# Patient Record
Sex: Male | Born: 1975
Health system: Southern US, Community
[De-identification: ages and names within clinical notes are randomized; demographics above are authoritative.]

## PROBLEM LIST (undated history)

## (undated) DIAGNOSIS — F419 Anxiety disorder, unspecified: Secondary | ICD-10-CM

## (undated) DIAGNOSIS — M1990 Unspecified osteoarthritis, unspecified site: Secondary | ICD-10-CM

## (undated) DIAGNOSIS — E785 Hyperlipidemia, unspecified: Secondary | ICD-10-CM

## (undated) DIAGNOSIS — G43909 Migraine, unspecified, not intractable, without status migrainosus: Secondary | ICD-10-CM

## (undated) DIAGNOSIS — A6920 Lyme disease, unspecified: Secondary | ICD-10-CM

## (undated) DIAGNOSIS — H8109 Meniere's disease, unspecified ear: Secondary | ICD-10-CM

## (undated) DIAGNOSIS — E079 Disorder of thyroid, unspecified: Secondary | ICD-10-CM

## (undated) DIAGNOSIS — S6290XA Unspecified fracture of unspecified wrist and hand, initial encounter for closed fracture: Secondary | ICD-10-CM

## (undated) DIAGNOSIS — IMO0001 Reserved for inherently not codable concepts without codable children: Secondary | ICD-10-CM

## (undated) DIAGNOSIS — R519 Headache, unspecified: Secondary | ICD-10-CM

## (undated) DIAGNOSIS — K900 Celiac disease: Secondary | ICD-10-CM

## (undated) HISTORY — DX: Migraine, unspecified, not intractable, without status migrainosus: G43.909

## (undated) HISTORY — DX: Celiac disease: K90.0

## (undated) HISTORY — DX: Anxiety disorder, unspecified: F41.9

## (undated) HISTORY — DX: Disorder of thyroid, unspecified: E07.9

## (undated) HISTORY — DX: Unspecified osteoarthritis, unspecified site: M19.90

## (undated) HISTORY — DX: Hyperlipidemia, unspecified: E78.5

## (undated) HISTORY — DX: Meniere's disease, unspecified ear: H81.09

## (undated) HISTORY — DX: Reserved for inherently not codable concepts without codable children: IMO0001

## (undated) HISTORY — PX: OTHER SURGICAL HISTORY: SHX169

## (undated) HISTORY — DX: Headache, unspecified: R51.9

## (undated) HISTORY — DX: Unspecified fracture of unspecified hand, initial encounter for closed fracture: S62.90XA

---

## 2000-05-30 ENCOUNTER — Emergency Department (HOSPITAL_COMMUNITY): Admission: EM | Admit: 2000-05-30 | Discharge: 2000-05-30 | Payer: Self-pay | Admitting: Emergency Medicine

## 2000-05-30 ENCOUNTER — Encounter: Payer: Self-pay | Admitting: Emergency Medicine

## 2001-10-11 ENCOUNTER — Encounter: Payer: Self-pay | Admitting: Gastroenterology

## 2001-10-11 ENCOUNTER — Encounter: Admission: RE | Admit: 2001-10-11 | Discharge: 2001-10-11 | Payer: Self-pay | Admitting: Gastroenterology

## 2002-10-25 ENCOUNTER — Emergency Department (HOSPITAL_COMMUNITY): Admission: EM | Admit: 2002-10-25 | Discharge: 2002-10-25 | Payer: Self-pay | Admitting: Emergency Medicine

## 2008-11-30 DIAGNOSIS — IMO0001 Reserved for inherently not codable concepts without codable children: Secondary | ICD-10-CM

## 2008-11-30 HISTORY — DX: Reserved for inherently not codable concepts without codable children: IMO0001

## 2010-07-15 ENCOUNTER — Ambulatory Visit: Payer: Self-pay | Admitting: Family Medicine

## 2010-07-15 DIAGNOSIS — F528 Other sexual dysfunction not due to a substance or known physiological condition: Secondary | ICD-10-CM | POA: Insufficient documentation

## 2010-07-15 DIAGNOSIS — J019 Acute sinusitis, unspecified: Secondary | ICD-10-CM | POA: Insufficient documentation

## 2010-07-15 DIAGNOSIS — F17200 Nicotine dependence, unspecified, uncomplicated: Secondary | ICD-10-CM | POA: Insufficient documentation

## 2010-07-16 ENCOUNTER — Encounter: Payer: Self-pay | Admitting: Family Medicine

## 2010-07-16 LAB — CONVERTED CEMR LAB
ALT: 45 units/L (ref 0–53)
AST: 30 units/L (ref 0–37)
Albumin: 4.6 g/dL (ref 3.5–5.2)
Alkaline Phosphatase: 63 units/L (ref 39–117)
BUN: 11 mg/dL (ref 6–23)
Basophils Absolute: 0 10*3/uL (ref 0.0–0.1)
Basophils Relative: 0 % (ref 0–1)
CO2: 25 meq/L (ref 19–32)
Calcium: 9.7 mg/dL (ref 8.4–10.5)
Chloride: 103 meq/L (ref 96–112)
Creatinine, Ser: 0.7 mg/dL (ref 0.40–1.50)
Eosinophils Absolute: 0.4 10*3/uL (ref 0.0–0.7)
Eosinophils Relative: 6 % — ABNORMAL HIGH (ref 0–5)
Glucose, Bld: 101 mg/dL — ABNORMAL HIGH (ref 70–99)
HCT: 45.1 % (ref 39.0–52.0)
Hemoglobin: 15.3 g/dL (ref 13.0–17.0)
Lymphocytes Relative: 41 % (ref 12–46)
Lymphs Abs: 3.1 10*3/uL (ref 0.7–4.0)
MCHC: 33.9 g/dL (ref 30.0–36.0)
MCV: 84.9 fL (ref 78.0–100.0)
Monocytes Absolute: 0.4 10*3/uL (ref 0.1–1.0)
Monocytes Relative: 6 % (ref 3–12)
Neutro Abs: 3.7 10*3/uL (ref 1.7–7.7)
Neutrophils Relative %: 48 % (ref 43–77)
PSA: 0.45 ng/mL (ref <=4.00)
Platelets: 247 10*3/uL (ref 150–400)
Potassium: 4 meq/L (ref 3.5–5.3)
RBC: 5.31 M/uL (ref 4.22–5.81)
RDW: 12.3 % (ref 11.5–15.5)
Sex Hormone Binding: 38 nmol/L (ref 13–71)
Sodium: 140 meq/L (ref 135–145)
TSH: 1.736 microintl units/mL (ref 0.350–4.500)
Testosterone Free: 68.7 pg/mL (ref 47.0–244.0)
Testosterone-% Free: 1.8 % (ref 1.6–2.9)
Testosterone: 372.33 ng/dL (ref 250–890)
Total Bilirubin: 0.4 mg/dL (ref 0.3–1.2)
Total Protein: 7.7 g/dL (ref 6.0–8.3)
WBC: 7.7 10*3/uL (ref 4.0–10.5)

## 2010-08-05 ENCOUNTER — Encounter: Payer: Self-pay | Admitting: Family Medicine

## 2010-09-06 ENCOUNTER — Encounter: Payer: Self-pay | Admitting: Family Medicine

## 2010-09-06 ENCOUNTER — Telehealth: Payer: Self-pay | Admitting: Family Medicine

## 2010-09-06 ENCOUNTER — Ambulatory Visit
Admission: RE | Admit: 2010-09-06 | Discharge: 2010-09-06 | Payer: Self-pay | Source: Home / Self Care | Attending: Family Medicine | Admitting: Family Medicine

## 2010-09-06 DIAGNOSIS — R002 Palpitations: Secondary | ICD-10-CM | POA: Insufficient documentation

## 2010-09-06 DIAGNOSIS — F411 Generalized anxiety disorder: Secondary | ICD-10-CM | POA: Insufficient documentation

## 2010-09-09 ENCOUNTER — Telehealth: Payer: Self-pay | Admitting: Family Medicine

## 2010-09-09 LAB — CONVERTED CEMR LAB
ALT: 39 units/L (ref 0–53)
AST: 26 units/L (ref 0–37)
Albumin: 4.6 g/dL (ref 3.5–5.2)
Alkaline Phosphatase: 58 units/L (ref 39–117)
BUN: 11 mg/dL (ref 6–23)
CO2: 27 meq/L (ref 19–32)
Calcium: 9.1 mg/dL (ref 8.4–10.5)
Chloride: 104 meq/L (ref 96–112)
Cholesterol: 256 mg/dL — ABNORMAL HIGH (ref 0–200)
Creatinine, Ser: 0.77 mg/dL (ref 0.40–1.50)
Glucose, Bld: 91 mg/dL (ref 70–99)
HDL: 42 mg/dL (ref >39)
LDL Cholesterol: 195 mg/dL — ABNORMAL HIGH (ref 0–99)
Potassium: 4.6 meq/L (ref 3.5–5.3)
Sodium: 140 meq/L (ref 135–145)
TSH: 1.731 microintl units/mL (ref 0.350–4.500)
Total Bilirubin: 0.4 mg/dL (ref 0.3–1.2)
Total CHOL/HDL Ratio: 6.1
Total Protein: 7.8 g/dL (ref 6.0–8.3)
Triglycerides: 97 mg/dL (ref <150)
VLDL: 19 mg/dL (ref 0–40)

## 2010-09-12 ENCOUNTER — Telehealth: Payer: Self-pay | Admitting: Family Medicine

## 2010-09-16 ENCOUNTER — Telehealth: Payer: Self-pay | Admitting: Family Medicine

## 2010-09-27 ENCOUNTER — Ambulatory Visit
Admission: RE | Admit: 2010-09-27 | Discharge: 2010-09-27 | Payer: Self-pay | Source: Home / Self Care | Attending: Family Medicine | Admitting: Family Medicine

## 2010-09-27 ENCOUNTER — Encounter: Payer: Self-pay | Admitting: Family Medicine

## 2010-09-27 DIAGNOSIS — G5600 Carpal tunnel syndrome, unspecified upper limb: Secondary | ICD-10-CM | POA: Insufficient documentation

## 2010-09-27 DIAGNOSIS — E785 Hyperlipidemia, unspecified: Secondary | ICD-10-CM | POA: Insufficient documentation

## 2010-10-01 NOTE — Assessment & Plan Note (Signed)
Summary: NOV: Sinusitis, ED   Vital Signs:  Patient profile:   35 year old male Height:      69.75 inches Weight:      212 pounds Pulse rate:   96 / minute BP sitting:   122 / 79  (right arm) Cuff size:   regular  Vitals Entered By: Avon Gully CMA, Duncan Dull) (July 15, 2010 2:23 PM) CC: NP-sinus pressure and pain and left jaw pain x 1 month, finished a z-pak still not feeling well   CC:  NP-sinus pressure and pain and left jaw pain x 1 month and finished a z-pak still not feeling well.  History of Present Illness: NP-sinus pressure and pain and left jaw pain x 1 month, finished a z-pak still not feeling well.Hx of recurrent sinus sxs. Usually uses a netti-pot. Pain is dull asn worse at night when lays flat over the right eye and in the right jaw and ear.  No ear drainage.  FEver about a week ago.  Zpack was a month ago adn did feel better for about a week.  Occ uses benadryl at bedtime.  Has had allergy testing a few years ago and was allergic to cats adn oak trees.  Does get some diarrhea with antibiotics.    ED sxs for almost a year. patient says he was having intercourse with his wife when he felt something pop in his penis.  Since then he notes he has had problems with erectile dysfunction.  He had difficulty getting a full erection.  There he admits this happened occasionally before the incident.  He does have normal nocturnal erections.  He is very happy with his marriage and his wife.  He denies any penile discharge or lesions.  Habits & Providers  Alcohol-Tobacco-Diet     Alcohol drinks/day: 0     Tobacco Status: current     Other Tobacco snuff  Current Medications (verified): 1)  None  Allergies (verified): 1)  ! Naproxen Sodium (Naproxen Sodium)  Comments:  Nurse/Medical Assistant: The patient's medications and allergies were reviewed with the patient and were updated in the Medication and Allergy Lists. 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  Married Lurena Joiner with one son.  Mother in law also lives in the home.  Smoking Status:  current  Review of Systems       No fever/sweats/weakness, unexplained weight loss/gain.  + vison changes.  + difficulty hearing/ringing in ears, hay fever/allergies.  + chest pain/discomfort, palpitations.  No Br lump/nipple discharge.  No cough/wheeze.  No blood in BM, nausea/vomiting/diarrhea.  No nighttime urination, leaking urine, unusual vaginal bleeding, discharge (penis or vagina).  + muscle/joint pain. No rash, change in mole.  + HA. No memory loss.  + anxiety. No  sleep d/o, depression.  No easy bruising/bleeding, unexplained lump. + concern with sexual function.   Physical Exam  General:  Well-developed,well-nourished,in no acute distress; alert,appropriate and cooperative throughout examination Head:  Normocephalic and atraumatic without obvious abnormalities. No apparent alopecia or balding. Eyes:  No corneal or conjunctival inflammation noted. EOMI. Perrla.  Ears:  External ear  exam shows no significant lesions or deformities.  Otoscopic examination reveals clear canals, tympanic membranes are intact bilaterally without bulging, retraction, inflammation or discharge. Hearing is grossly normal bilaterally. Nose:  External nasal examination shows no deformity or inflammation.  Mouth:  Oral mucosa and oropharynx without lesions or exudates.  Teeth in good repair. Lungs:  Normal respiratory effort, chest expands symmetrically.Mild wheez e on the right lower lung.  Heart:   Normal rate and regular rhythm. S1 and S2 normal without gallop, murmur, click, rub or other extra sounds. Skin:  no rashes.   Cervical Nodes:  No lymphadenopathy noted Psych:  Cognition and judgment appear intact. Alert and cooperative with normal attention span and concentration. No apparent delusions, illusions, hallucinations   Impression & Recommendations:  Problem # 1:  SINUSITIS - ACUTE-NOS (ICD-461.9) it appears he has an acute on chronic sinusitis.  Will change to doxycycline.  If he is not improved in one week then may need to get further imaging or change antibiotics again.  He does have sensitivity to medications causing diarrhea so will discontinue if he has excessive diarrhea.  Because he has a history of chronic infections I will go ahead and refer him to ENT for further evaluation.  He may benefit from surgery. His updated medication list for this problem includes:    Doxycycline Hyclate 100 Mg Caps (Doxycycline hyclate) .Marland Kitchen... Take 1 tablet by mouth two times a day for 10 day.  Orders: ENT Referral (ENT)  Instructed on treatment. Call if symptoms persist or worsen.   Problem # 2:  ERECTILE DYSFUNCTION, NON-ORGANIC (ICD-302.72) discussed with patient that we would rule out causes of ED including abnormal glucose thyroid and low testosterone levels.  He did fill out an ED questionnaire for me.  He had a score of 18.  He did answer yes to 3 questions on a low testosterone this.  Based on his history and that his symptoms technically started before the incident and he has normal nocturnal erections I do think he probably has more psychogenic ED.  If labs are normal can do a trial of Cialis.  If this does not help his symptoms and I do recommend referral to urology to make sure he does not have any permanent damage.  And we discussed the fact that I do think that this was more psychogenic. Orders: T-CBC w/Diff [phone]) T-Comprehensive Metabolic Panel [id number]) T-TSH  [id number]) T-PSA [id number]) T-Testosterone, Free and Total ([phone])  His updated medication list for this problem includes:    Viagra 100 Mg Tabs (Sildenafil citrate) .Marland Kitchen... Take 1 tablet by mouth once a day as needed about one hour before intercourse.  Complete Medication List: 1)  Doxycycline Hyclate 100 Mg Caps (Doxycycline hyclate) .... Take 1 tablet by mouth two times a day for 10 day. 2)  Viagra 100 Mg Tabs (Sildenafil citrate) .... Take 1 tablet by mouth once a day as needed about one hour before intercourse.  Patient Instructions: 1)  WE will call you with your lab results.  2)  I will send 2 meds over to your pharmacy.  3)  Please schedule a follow-up appointment in 1 month for the ED.  Prescriptions: VIAGRA 100 MG TABS (SILDENAFIL CITRATE) Take 1 tablet by mouth once a day as needed about one hour before intercourse.  #8 x 1   Entered and Authorized by:   Nani Gasser MD   Signed by:   Nani Gasser MD on 07/15/2010   Method used:   Electronically to  [facility] [phone]* (retail)       [address] [place], [place]  [address]       Ph: [phone]       Fax: [phone]   RxID:   [id number] DOXYCYCLINE HYCLATE 100 MG CAPS (DOXYCYCLINE HYCLATE) Take 1 tablet by mouth two times a day for 10 day.  #20 x 0   Entered and Authorized by:   Nani Gasser MD   Signed by:   Nani Gasser MD on 07/15/2010   Method used:   Electronically to        [facility] [phone]* (retail)       [address] [place], [place]  [address]       Ph: [phone]       Fax: [phone]   RxID:   [id number]    Orders Added: 1)  ENT Referral [ENT] 2)  New Patient Level IV [99204] 3)  T-CBC w/Diff [[id number]] 4)  T-Comprehensive Metabolic Panel [[id number]] 5)  T-TSH [[id number]] 6)  T-PSA [[id number]] 7)  T-Testosterone, Free and Total [phone]

## 2010-10-01 NOTE — Letter (Signed)
Summary: ED Questionnaire  ED Questionnaire   Imported By: Lanelle Bal 07/29/2010 08:40:37  _____________________________________________________________________  External Attachment:    Type:   Image     Comment:   External Document

## 2010-10-03 NOTE — Assessment & Plan Note (Signed)
Summary: Mood, etc   Vital Signs:  Patient profile:   35 year old male Height:      69.75 inches Weight:      214 pounds Pulse rate:   66 / minute BP sitting:   125 / 68  (right arm) Cuff size:   regular  Vitals Entered By: Isaias Cowman CMA, Deborra Medina) (September 27, 2010 3:54 PM) CC: f/u mood   Primary Care Provider:  Beatrice Lecher MD  CC:  f/u mood.  History of Present Illness: Start the sertraline. By the second week was sleeping much better. Did also cut out caffiene.   People around him feel he is not as gruff and irritable.  When uses the xanax says it really makes a big difference for him, not as irritable or anxious. Tought has to take 2 tabs for it to work and finds he really needs to take it bid.  He says it takes 2 hours to start working for him. Feels thinking more clearly and feels he has dec his smoking.   He also has hx of carpel tunnel in both hands.  Has had numbness in his fingertips on his left hand.  Says occ hands feel weak at work. No pain in the elbow but does have some pain in the right elbow with ratcheting when flexes his right elbow. Has stopped taking Advil Daily. Says his stomach is actually better. Can't take Tyelnol. Hurt his back over the weekend.   Current Medications (verified): 1)  Probiotic  Caps (Probiotic Product) 2)  Sertraline Hcl 100 Mg Tabs (Sertraline Hcl) .... Take 1 Tablet By Mouth Once A Day 3)  Clonazepam 0.5 Mg Tabs (Clonazepam) .... Take 1-2  Tablet By Mouth Once A Day As Needed Anxiety 4)  Lipitor 40 Mg Tabs (Atorvastatin Calcium) .... Take 1 Tablet By Mouth Once A Day At Bedtime. Generic Please.  Allergies (verified): 1)  ! Naproxen Sodium (Naproxen Sodium)  Comments:  Nurse/Medical Assistant: The patient's medications and allergies were reviewed with the patient and were updated in the Medication and Allergy Lists. 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  Married Wells Guiles with one son.  Mother in law also lives in the home.    Past Medical History: Exercise Stress Test  11/2008 at [facility] - Negative.  Terminated for fatigue .  right  hand fracture.   Past Surgical History: Surgery on right hand.   Social History: Reviewed history from 07/15/2010 and no changes required. 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  Married Wells Guiles with one son.  Mother in law also lives in the home.    Physical Exam  General:  Well-developed,well-nourished,in no acute distress; alert,appropriate and cooperative throughout examination Lungs:  Normal respiratory effort, chest expands symmetrically. Lungs are clear to auscultation, no crackles or wheezes. Heart:  Normal rate and regular rhythm. S1 and S2 normal without gallop, murmur, click, rub or other extra sounds. Psych:  Cognition and judgment appear intact. Alert and cooperative with normal attention span and concentration. No apparent delusions, illusions, hallucinations   Impression & Recommendations:  Problem # 1:  GENERALIZED ANXIETY DISORDER (ICD-300.02) Assessment Deteriorated Says lexapro made him "crazy" in the past. Tolerating sertraline well but not efficacious at this point.  Will inc to 200 mg and f/u in 3-4 weeks. If no sign of sig improvment at f/u then will change to fluoxetine.  GAD-7 score of 14 today which is actually worse than last time thought he does feel he is sleeping better and is less irritable.  F/ U in 1 months.   30 plus minutes spent face to face in counseling.  The following medications were removed from the medication list:    Sertraline Hcl 100 Mg Tabs (Sertraline hcl) .Marland Kitchen... Take 1 tablet by mouth once a day His updated medication list for this problem includes:    Sertraline Hcl 100 Mg Tabs (Sertraline hcl) .Marland Kitchen..Marland Kitchen Two tabs by mouth daily.    Clonazepam 1 Mg Tabs (Clonazepam) ..... One or twice a day as needed for  rescue anxiety  Problem # 2:  HYPERLIPIDEMIA (ICD-272.4) He really doesn't want to take th statin. Wants to try exercise and diet Explained this will not liekly get him to goal and it likely genetic but will try it for 3-4 months and then recheck.  He said he is motivated to make changes.  The following medications were removed from the medication list:    Lipitor 40 Mg Tabs (Atorvastatin calcium) .Marland Kitchen... Take 1 tablet by mouth once a day at bedtime. generic please.  Problem # 3:  CARPAL TUNNEL SYNDROME, BILATERAL (ICD-354.0) Assessment: New Recommend start wearing his splints at night and if not improving in 3-4 weeks to let me know and will consider ortho referral. His GI are sxs are better of of daily Advil but can still use it occ if needed.   Complete Medication List: 1)  Sertraline Hcl 100 Mg Tabs (Sertraline hcl) .... Two tabs by mouth daily. 2)  Probiotic Caps (Probiotic product) 3)  Clonazepam 1 Mg Tabs (Clonazepam) .... One or twice a day as needed for rescue anxiety 4)  Ventolin Hfa 108 (90 Base) Mcg/act Aers (Albuterol sulfate) .... 2-4 puffs inhaled eveyr 4-6 horus as needed wheezing  Patient Instructions: 1)  Let recheck your cholesterol in 3-4 months with diet and exericse 2)  Please schedule a follow-up appointment in 1 month for mood. Increase sertraline to 2 tabs.  Prescriptions: CLONAZEPAM 1 MG TABS (CLONAZEPAM) one or twice a day as needed for rescue anxiety  #45 x 0   Entered and Authorized by:   Beatrice Lecher MD   Signed by:   Beatrice Lecher MD on 09/27/2010   Method used:   Printed then faxed to ...       [facility] [phone]* (retail)       [address] [place], [place]  [address]       Ph: [phone]       Fax: [phone]   RxID:   [id number] SERTRALINE HCL 100 MG TABS (SERTRALINE HCL) two tabs by mouth daily.  #60 x 0   Entered and Authorized by:   Beatrice Lecher MD   Signed by:   Beatrice Lecher MD on 09/27/2010    Method used:   Electronically to        [facility] [phone]* (retail)       [place], [place]  [address]       Ph: [phone]       Fax: [phone]   RxID:   [id number]    Orders Added: 1)  Est. Patient Level IV [23557]

## 2010-10-03 NOTE — Letter (Signed)
Summary: Anxiety Questionnaire  Anxiety Questionnaire   Imported By: Edmonia James 09/25/2010 10:39:17  _____________________________________________________________________  External Attachment:    Type:   Image     Comment:   External Document

## 2010-10-03 NOTE — Assessment & Plan Note (Signed)
Summary: CPE and palpitations.    Vital Signs:  Patient profile:   35 year old male Height:      69.75 inches Weight:      231 pounds Pulse rate:   63 / minute BP sitting:   93 / 52  (right arm) Cuff size:   regular  Vitals Entered By: Isaias Cowman CMA, Deborra Medina) (September 06, 2010 10:32 AM) CC: cpe,heart feels like its beating hard or racing at times   Primary Care Provider:  Beatrice Lecher MD  CC:  cpe and heart feels like its beating hard or racing at times.  History of Present Illness: cpe,heart feels like its beating hard or racing at times.Says saw cardiology at [facility] about a year ago and told it was not her heart.   Family hx of anxiety.  Says the heart racing went away but now recently it started gain. Has had several viral syndromes this winter. Has been alot more stressed lately. Works out a couple of days a week adn still not relieving his stress.  he has no history of high blood pressure or known high cholesterol.  and a premature family history of heart disease.  He does not do any dizziness or shortness of breath with the episodes.  He says they only last maybe a minute or two.  He typically happens when he feels anxious or is upset.  Current Medications (verified): 1)  Probiotic  Caps (Probiotic Product)  Allergies (verified): 1)  ! Naproxen Sodium (Naproxen Sodium)  Comments:  Nurse/Medical Assistant: The patient's medications and allergies were reviewed with the patient and were updated in the Medication and Allergy Lists. Isaias Cowman CMA, Deborra Medina) (September 06, 2010 10:33 AM)  Family History: grandfather with alcoholism  mother with breast cancer. hi chol  grandmother with MI and diabetes father with hypertension and high cholesterol uncle with colon cance Ant with breast cancer grandmother with lung cancer grandfather with prostate cancer uncle with melanoma  Review of Systems  The patient denies anorexia, fever, weight loss, weight gain, vision  loss, decreased hearing, hoarseness, chest pain, syncope, dyspnea on exertion, peripheral edema, prolonged cough, headaches, hemoptysis, abdominal pain, melena, hematochezia, severe indigestion/heartburn, hematuria, incontinence, genital sores, muscle weakness, suspicious skin lesions, transient blindness, difficulty walking, depression, unusual weight change, abnormal bleeding, enlarged lymph nodes, angioedema, and testicular masses.    Physical Exam  General:  Well-developed,well-nourished,in no acute distress; alert,appropriate and cooperative throughout examination Head:  Normocephalic and atraumatic without obvious abnormalities. No apparent alopecia or balding. Eyes:  No corneal or conjunctival inflammation noted. EOMI. Perrla.  Ears:  External ear exam shows no significant lesions or deformities.  Otoscopic examination reveals clear canals, tympanic membranes are intact bilaterally without bulging, retraction, inflammation or discharge. Hearing is grossly normal bilaterally. Nose:  External nasal examination shows no deformity or inflammation. Nasal mucosa are pink and moist without lesions or exudates. Mouth:  Oral mucosa and oropharynx without lesions or exudates.  Teeth in good repair. Neck:  No deformities, masses, or tenderness noted. Lungs:  Normal respiratory effort, chest expands symmetrically. Lungs are clear to auscultation, no crackles or wheezes. Heart:  Normal rate and regular rhythm. S1 and S2 normal without gallop, murmur, click, rub or other extra sounds. Abdomen:  Bowel sounds positive,abdomen soft and non-tender without masses, organomegaly or hernias noted. Msk:  No deformity or scoliosis noted of thoracic or lumbar spine.   Pulses:  R and L carotid,radial,dorsalis pedis and posterior tibial pulses are full and equal bilaterally  Extremities:  No clubbing, cyanosis, edema, or deformity noted with normal full range of motion of all joints.   Neurologic:  No cranial nerve  deficits noted. Station and gait are normal. DTRs are symmetrical throughout. Sensory, motor and coordinative functions appear intact. Skin:  no rashes.   Cervical Nodes:  No lymphadenopathy noted Psych:  Cognition and judgment appear intact. Alert and cooperative with normal attention span and concentration. No apparent delusions, illusions, hallucinations   Impression & Recommendations:  Problem # 1:  [place] (ICD-V70.0) exam is normal today.  Due for screening lab work.I do not have an update screening cholesterol on him.  But also like to check his thyroid since he is having palpitations.  He really does need update vaccines that he does not want a flu shot today.  Orders: T-Comprehensive Metabolic Panel ([id number]) T-Lipid Profile [id number]) T-TSH ([id number])  Problem # 2:  PALPITATIONS (ICD-785.1)  EKG shows rate of 73 bpm, NSR. no acue changes.  Gave him reassurance that this is likely anxiety. He is already in counseling, through his church. Has been going there for 6 yearas.  GAD-7 today is 11. we discussed different options including medications as he is Re: doing counseling and exercising regularly.  We discusseddiscussed SSRIs and the potential side effects.  He's to monitor for any suicidal thoughts.  I will start him on sertraline and see him back in 3 or 4 weeks.  Orders: EKG w/ Interpretation (93000)  Problem # 3:  GENERALIZED ANXIETY DISORDER (ICD-300.02)  His updated medication list for this problem includes:    Sertraline Hcl 50 Mg Tabs (Sertraline hcl) .Marland Kitchen... 1/2 tab by mouth dialy for one week, then increase to whole tab dialy.  Orders: T-Comprehensive Metabolic Panel ([id number]) T-Lipid Profile [id number]) T-TSH [id number])  Complete Medication List: 1)  Probiotic Caps (Probiotic product) 2)  Sertraline Hcl 50 Mg Tabs (Sertraline hcl) .... 1/2 tab by mouth dialy for one week, then increase to whole tab dialy.  Patient  Instructions: 1)  Please schedule a follow-up appointment in 3-4 weeks for mood before due for your refill.  Call if any concerns 2)  We will call you next week with your lab results.   Prescriptions: SERTRALINE HCL 50 MG TABS (SERTRALINE HCL) 1/2 tab by mouth dialy for one week, then increase to whole tab dialy.  #30 x 0   Entered and Authorized by:   Beatrice Lecher MD   Signed by:   Beatrice Lecher MD on 09/06/2010   Method used:   Electronically to        [facility] (retail)       [place], [place]  [address]       Ph: [phone]       Fax: [phone]   RxID:   [id number]    Orders Added: 1)  T-Comprehensive Metabolic Panel [[id number]] 2)  T-Lipid Profile [[id number]] 3)  T-TSH [[id number]] 4)  EKG w/ Interpretation [93000] 5)  Est. Patient Level III [15830] 6)  Est. Patient age 66-39 [41]      Appended Document: CPE and palpitations.     Past History:  Past Medical History: Exercise Stress Test  11/2008 at [facility] - Negative.  Terminated for fatigue .     Lipid Panel Test Date: 12/13/2010                        Value  Units        H/L   Reference  Cholesterol:          210          mg/dL              (100-240) LDL Cholesterol:      139          mg/dL              (40-160) HDL Cholesterol:      43           mg/dL              (30-80) Triglyceride:         138          mg/dL              (50-300)

## 2010-10-03 NOTE — Progress Notes (Signed)
Summary: PT REQ A CALL  Phone Note Call from Patient   Caller: Patient Summary of Call: PT CALLED REQ TO HAVE INHALER ALBUTEROL CALLED INTO [facility] IN [place]. PT [facility] [phone] Initial call taken by: Janeal Holmes,  September 09, 2010 10:13 AM  Follow-up for Phone Call        For what? Does he have asthma? He has never told me this.  Follow-up by: Nani Gasser MD,  September 09, 2010 3:51 PM  Additional Follow-up for Phone Call Additional follow up Details #1::        called and left message on pt vm to call back to let us know if he has been dx with asthma Additional Follow-up by: Avon Gully CMA, Duncan Dull),  September 09, 2010 5:12 PM    New/Updated Medications: VENTOLIN HFA 108 (90 BASE) MCG/ACT AERS (ALBUTEROL SULFATE) 2-4 puffs inhaled eveyr 4-6 horus as needed wheezing Prescriptions: VENTOLIN HFA 108 (90 BASE) MCG/ACT AERS (ALBUTEROL SULFATE) 2-4 puffs inhaled eveyr 4-6 horus as needed wheezing  #1 x 0   Entered and Authorized by:   Nani Gasser MD   Signed by:   Nani Gasser MD on 09/09/2010   Method used:   Electronically to        [facility] [phone]* (retail)       [address]       [place], [place]  [address]       Ph: [phone]       Fax: [phone]   RxID:   [id number]

## 2010-10-03 NOTE — Progress Notes (Signed)
Summary: Clonazepam Rx  Phone Note Refill Request Call back at Home Phone [phone] Message from:  Patient on September 16, 2010 1:03 PM  Refills Requested: Medication #1:  CLONAZEPAM 0.5 MG TABS Take 1 tablet by mouth once a day as needed anxiety Pls send in Rx, pt states that dosage should be 2 tablets a day   Method Requested: Electronic Initial call taken by: Lannette Donath,  September 16, 2010 1:04 PM  Follow-up for Phone Call        since we just sent rx in on 1/6 and we changed directions to take two at one time per pt request can we send in another rx? Follow-up by: Avon Gully CMA, Duncan Dull),  September 17, 2010 9:48 AM  Additional Follow-up for Phone Call Additional follow up Details #1::        Yes new rx sent. I inc dose to 30 tabs but this is to last a month. Please let him know that.He is not to use them every single day and we talked about that in the room. If stil having alot of anxiety I can adjust his sertraline as well.  Additional Follow-up by: Nani Gasser MD,  September 17, 2010 10:22 AM    Additional Follow-up for Phone Call Additional follow up Details #2::    pt notified. pt states he notices the anxiety seems to increase Follow-up by: Avon Gully CMA, Duncan Dull),  September 17, 2010 10:56 AM  New/Updated Medications: SERTRALINE HCL 100 MG TABS (SERTRALINE HCL) Take 1 tablet by mouth once a day CLONAZEPAM 0.5 MG TABS (CLONAZEPAM) Take 1-2  tablet by mouth once a day as needed anxiety Prescriptions: SERTRALINE HCL 100 MG TABS (SERTRALINE HCL) Take 1 tablet by mouth once a day  #30 x 0   Entered and Authorized by:   Nani Gasser MD   Signed by:   Nani Gasser MD on 09/17/2010   Method used:   Electronically to        [facility] [phone]* (retail)       [address]       [place], [place]  [address]       Ph: [phone]       Fax: [phone]   RxID:   [id number] SERTRALINE HCL 100 MG TABS (SERTRALINE HCL) Take 1 tablet by  mouth once a day  #30 x 0   Entered and Authorized by:   Nani Gasser MD   Signed by:   Nani Gasser MD on 09/17/2010   Method used:   Printed then faxed to ...       [facility] [phone]* (retail)       [address] [place], [place]  [address]       Ph: [phone]       Fax: [phone]   RxID:   [id number] CLONAZEPAM 0.5 MG TABS (CLONAZEPAM) Take 1-2  tablet by mouth once a day as needed anxiety  #30 x 0   Entered and Authorized by:   Nani Gasser MD   Signed by:   Nani Gasser MD on 09/17/2010   Method used:   Printed then faxed to ...       [facility] [phone]* (retail)       [address] [place], [place]  [address]       Ph: [phone]       Fax: [phone]   RxID:  [id number]  

## 2010-10-03 NOTE — Consult Note (Signed)
Summary: [facility]  [facility]   Imported By: Lanelle Bal 08/20/2010 11:10:34  _____________________________________________________________________  External Attachment:    Type:   Image     Comment:   External Document

## 2010-10-03 NOTE — Progress Notes (Signed)
Summary: meds  Phone Note Call from Patient   Caller: Patient Call For: Beatrice Lecher MD Summary of Call: pt called and states he thought there was supposed to be another medication called into the pharm that was a rescue medication for his anxiety Initial call taken by: Isaias Cowman CMA, Deborra Medina),  September 06, 2010 2:23 PM  Follow-up for Phone Call        Sorry. Now sent.  Follow-up by: Beatrice Lecher MD,  September 06, 2010 5:09 PM    New/Updated Medications: CLONAZEPAM 0.5 MG TABS (CLONAZEPAM) Take 1 tablet by mouth once a day as needed anxiety Prescriptions: CLONAZEPAM 0.5 MG TABS (CLONAZEPAM) Take 1 tablet by mouth once a day as needed anxiety  #15 x 0   Entered and Authorized by:   Beatrice Lecher MD   Signed by:   Beatrice Lecher MD on 09/06/2010   Method used:   Printed then faxed to ...       [facility] [phone]* (retail)       [place], [place]  [address]       Ph: [phone]       Fax: [phone]   RxID:   [id number]

## 2010-10-03 NOTE — Progress Notes (Signed)
Summary: ? about clonazepam  Phone Note Call from Patient Call back at [phone]   Caller: Patient Call For: Nani Gasser MD Summary of Call: Pt called and states the Clonazepam is not helping at all. Pt wanted to know if it was ok to take one in the am and one in the pm can he have a higher dose  Initial call taken by: Avon Gully CMA, Duncan Dull),  September 12, 2010 3:55 PM  Follow-up for Phone Call        If not working well. Try 2 tabs at one time and see if helps.  Follow-up by: Nani Gasser MD,  September 12, 2010 4:00 PM  Additional Follow-up for Phone Call Additional follow up Details #1::        pt notified Additional Follow-up by: Avon Gully CMA, Duncan Dull),  September 12, 2010 4:47 PM

## 2010-10-17 NOTE — Letter (Signed)
Summary: Generalized Anxiety Disorder Scale  Generalized Anxiety Disorder Scale   Imported By: Maryln Gottron 10/10/2010 12:26:37  _____________________________________________________________________  External Attachment:    Type:   Image     Comment:   External Document

## 2010-10-23 NOTE — Letter (Signed)
Summary: Records Dated 1-06 thru 4-10/[facility]   Records Dated 1-06 thru 4-10/[facility]   Imported By: Lanelle Bal 10/15/2010 13:46:30  _____________________________________________________________________  External Attachment:    Type:   Image     Comment:   External Document

## 2010-10-25 ENCOUNTER — Ambulatory Visit (INDEPENDENT_AMBULATORY_CARE_PROVIDER_SITE_OTHER): Payer: BC Managed Care – PPO | Admitting: Family Medicine

## 2010-10-25 ENCOUNTER — Encounter: Payer: Self-pay | Admitting: Family Medicine

## 2010-10-25 DIAGNOSIS — IMO0001 Reserved for inherently not codable concepts without codable children: Secondary | ICD-10-CM | POA: Insufficient documentation

## 2010-10-25 DIAGNOSIS — R062 Wheezing: Secondary | ICD-10-CM

## 2010-10-25 DIAGNOSIS — F411 Generalized anxiety disorder: Secondary | ICD-10-CM

## 2010-10-29 NOTE — Assessment & Plan Note (Signed)
Summary: 1 mo f/u anxiety   Vital Signs:  Patient profile:   35 year old male Height:      69.75 inches Weight:      217.25 pounds BMI:     31.51 Temp:     99.0 degrees F oral Pulse rate:   90 / minute BP sitting:   129 / 76  (right arm) Cuff size:   large  Vitals Entered By: Jiles Garter CMA (October 25, 2010 4:19 PM) CC: 1 month follow up Comments medication follow up-tolerating medication okay   Primary Care Provider:  Beatrice Lecher MD  CC:  1 month follow up.  History of Present Illness: Less irritability over the last month. Says over the weeknd she feels like the medicine finally kicked in.   Still wheezing at night. Most nights. Says the inhaler made his throat and lungs itch. Says his wife can hear him wheezing. No hx of athma.  This only started recently. He does occ snore.   Allergies: 1)  ! Naproxen Sodium (Naproxen Sodium)  Physical Exam  General:  Well-developed,well-nourished,in no acute distress; alert,appropriate and cooperative throughout examination Lungs:  Normal respiratory effort, chest expands symmetrically. Lungs are clear to auscultation, no crackles or wheezes. Heart:  Normal rate and regular rhythm. S1 and S2 normal without gallop, murmur, click, rub or other extra sounds.   Impression & Recommendations:  Problem # 1:  GENERALIZED ANXIETY DISORDER (ICD-300.02) GAD-7 score of 6 today, much improved.  Of note at the end fo teh visit he told me ihe is only taking 1 tab of the sertaline. Certainly he can inc to 1 and 1/2 tabs or 2 tabs dialy.  F/U in 2 months.  His updated medication list for this problem includes:    Sertraline Hcl 100 Mg Tabs (Sertraline hcl) .Marland Kitchen..Marland Kitchen Two tabs by mouth daily.    Clonazepam 1 Mg Tabs (Clonazepam) ..... One or twice a day as needed for rescue anxiety  Problem # 2:  WHEEZING (ICD-786.07) Since he says his nightime wheeze is auditory this is very unlikely to be asthma.  Discussed options of spirometry vs ENT vs  pulmonary referral. I really think this could be reflux, throat obstruction (possibly sleep apnea) or sinus sxs.  He has recently had a cold but he is feeling better.    Complete Medication List: 1)  Sertraline Hcl 100 Mg Tabs (Sertraline hcl) .... Two tabs by mouth daily. 2)  Probiotic Caps (Probiotic product) 3)  Clonazepam 1 Mg Tabs (Clonazepam) .... One or twice a day as needed for rescue anxiety  Patient Instructions: 1)  Recommend start nasal rinse.  2)  Please schedule a follow-up appointment in 2 months for mood.  3)  Let me know if you want to see ENT or Pulmonary for nighttimne wheezing.  Prescriptions: CLONAZEPAM 1 MG TABS (CLONAZEPAM) one or twice a day as needed for rescue anxiety  #30 x 0   Entered and Authorized by:   Beatrice Lecher MD   Signed by:   Beatrice Lecher MD on 10/25/2010   Method used:   Printed then faxed to ...       [facility] [phone]* (retail)       [place], [place]  [address]       Ph: [phone]       Fax: [phone]   RxID:   [id number] SERTRALINE HCL 100 MG TABS (SERTRALINE HCL) two tabs by mouth daily.  #  60 x 1   Entered and Authorized by:   Beatrice Lecher MD   Signed by:   Beatrice Lecher MD on 10/25/2010   Method used:   Printed then faxed to ...       [facility] [phone]* (retail)       [place], [place]  [address]       Ph: [phone]       Fax: [phone]   RxID:   [id number]    Orders Added: 1)  Est. Patient Level III [74718]    Current Allergies (reviewed today): ! NAPROXEN SODIUM (NAPROXEN SODIUM)

## 2010-11-05 ENCOUNTER — Telehealth: Payer: Self-pay | Admitting: Family Medicine

## 2010-11-06 ENCOUNTER — Ambulatory Visit
Admission: RE | Admit: 2010-11-06 | Discharge: 2010-11-06 | Disposition: A | Discharge status: Home / Self Care | Payer: BC Managed Care – PPO | Source: Ambulatory Visit | Attending: Family Medicine | Admitting: Family Medicine

## 2010-11-06 ENCOUNTER — Ambulatory Visit (INDEPENDENT_AMBULATORY_CARE_PROVIDER_SITE_OTHER): Payer: BC Managed Care – PPO | Admitting: Family Medicine

## 2010-11-06 ENCOUNTER — Encounter: Payer: Self-pay | Admitting: Family Medicine

## 2010-11-06 ENCOUNTER — Other Ambulatory Visit: Payer: Self-pay | Admitting: Family Medicine

## 2010-11-06 DIAGNOSIS — F411 Generalized anxiety disorder: Secondary | ICD-10-CM

## 2010-11-06 DIAGNOSIS — R197 Diarrhea, unspecified: Secondary | ICD-10-CM

## 2010-11-06 DIAGNOSIS — R062 Wheezing: Secondary | ICD-10-CM

## 2010-11-12 NOTE — Assessment & Plan Note (Signed)
Summary:  wheezing, diarrhea, mood   Vital Signs:  Patient profile:   35 year old male Height:      69.75 inches Weight:      215 pounds O2 Sat:      95 % on Room air Pulse rate:   99 / minute BP sitting:   114 / 80  (right arm) Cuff size:   large  Vitals Entered By: Isaias Cowman CMA, Deborra Medina) (November 06, 2010 10:55 AM)  O2 Flow:  Room air CC: discuss diarrhea on meds   Primary Care Provider:  Beatrice Lecher MD  CC:  discuss diarrhea on meds.  History of Present Illness: WEnt up on the sertraline to 256m and got odd dreams.  For the last 5 days went back to 1080mtab and no change in her diarrhea. Says has been arond people who have had stomach bugs.  Says normall immodium works but hasn't  been working.  Hx of bouts of diarrhea. WAs on asacol at one time. Was dx with mucosal colitis.   Cough for several week. Sounds like he is wheezing again.  No fever. NO sinus sxs. Cough is wet and occ feels SOB with the cough.  HAs been having diarrhea that started about 2 weeks ago.  Last tx with doxy in November for a sinus infection.  he says he has not actually had a bowel movement today. he says the wheezing with some mild last night but woke his wife up and she we went him to go to the emergency room. he still was able to breathe it wasn't significantly short of breath. emergency social breath when he gets the cough. He denies any ear pain or sore throat. his current symptoms are slightly different than the slight upper throat wheezing that he complained of it his last office visit. he says that he will get itching in his chest and under his arms in the axillary area after a coughing fit. He usually will last for a few minutes and then resolves. He wants to make sure that he doesn't have cancer.    he would really like to decrease his sertraline. He is happy that he has not had to take any of his Clonopin and so he is interested in decreasing his dose and weaning off of it.  Current  Medications (verified): 1)  Sertraline Hcl 100 Mg Tabs (Sertraline Hcl) .... Two Tabs By Mouth Daily. 2)  Probiotic  Caps (Probiotic Product) 3)  Clonazepam 1 Mg Tabs (Clonazepam) .... One or Twice A Day As Needed For Rescue Anxiety  Allergies (verified): 1)  ! Naproxen Sodium (Naproxen Sodium)  Comments:  Nurse/Medical Assistant: The patient's medications and allergies were reviewed with the patient and were updated in the Medication and Allergy Lists. AnIsaias CowmanMA, (ADeborra Medina(November 06, 2010 10:56 AM)  Physical Exam  General:  Well-developed,well-nourished,in no acute distress; alert,appropriate and cooperative throughout examination Head:  Normocephalic and atraumatic without obvious abnormalities. No apparent alopecia or balding. Eyes:  No corneal or conjunctival inflammation noted. EOMI. Perrla.  Ears:  External ear exam shows no significant lesions or deformities.  Otoscopic examination reveals clear canals, tympanic membranes are intact bilaterally without bulging, retraction, inflammation or discharge. Hearing is grossly normal bilaterally. Nose:  External nasal examination shows no deformity or inflammation. Nasal mucosa are pink and moist without lesions or exudates. Mouth:  Oral mucosa and oropharynx without lesions or exudates.  Teeth in good repair. Neck:  No deformities, masses, or tenderness  noted. Lungs:  Normal respiratory effort, chest expands symmetrically. Lungs are clear to auscultation, no crackles or wheezes. Heart:  Normal rate and regular rhythm. S1 and S2 normal without gallop, murmur, click, rub or other extra sounds. Skin:  no rashes.   Cervical Nodes:  No lymphadenopathy noted Psych:  Cognition and judgment appear intact. Alert and cooperative with normal attention span and concentration. No apparent delusions, illusions, hallucinations   Impression & Recommendations:  Problem # 1:  WHEEZING (ICD-786.07)  a chest x-ray to evaluate for pneumonia and  bronchitis. It is possible that he has bronchitis. His complaint today is definitely different from what he had when I saw him about a week ago. Today it certainly could be more infectious. He has a slight expiratory wheeze on exam but it was with forced expiration.  if appropriate we will call in antibiotics. I still recommend he follow up with pulmonology as he reports his complicated history of recurrent infections and the question of whether he Asthma. Orders: T-Chest x-ray, 2 views (71020)  Problem # 2:  GENERALIZED ANXIETY DISORDER (ICD-300.02)  I discussed with him that I would really like him to stay on his current dose of 100 mg for at least 6-12 months. I explained him that the literature supports that those people to stay on it for 6-12 months or less likely to relapse and will be less likely to restart a medication within the next year. I can tell he was still uncomfortable with this and really wanted to decrease his medication so he can certainly go back to half of a tablet . The 100 mg dose has been working well for him  and I think this is his therapeutic dose. His updated medication list for this problem includes:    Sertraline Hcl 100 Mg Tabs (Sertraline hcl) .Marland Kitchen... 1/2 tab by mouth daily.    Clonazepam 1 Mg Tabs (Clonazepam) ..... One or twice a day as needed for rescue anxiety  Problem # 3:  DIARRHEA (ICD-787.91)  this started after he increased his sertraline to 200 mg. He has been back, 1 mg tab for the last 5 days. Certainly the diarrhea may resolve in the next few days and he could give this time. He reports he has not had a bowel movement yet today so that may be a good sign that his bodies returned to normal. He is also been exposed to several people in his home who have had stomach viruses and this certainly could be contributing. If the diarrhea persists for another week please let me know and we can work this up further with stool studies. He has not had antibiotics since  November so I'm not suspicious of C. difficile. He has not traveled recently or gone camping. Orders: T-Chest x-ray, 2 views (71020)  His updated medication list for this problem includes:    Probiotic Caps (Probiotic product)  Complete Medication List: 1)  Sertraline Hcl 100 Mg Tabs (Sertraline hcl) .... 1/2 tab by mouth daily. 2)  Probiotic Caps (Probiotic product) 3)  Clonazepam 1 Mg Tabs (Clonazepam) .... One or twice a day as needed for rescue anxiety  Patient Instructions: 1)  You can drop the sertaline to half a tab. Given a diarrhea about a week to resolve.   2)  WE will call you with xray results.    Orders Added: 1)  T-Chest x-ray, 2 views [71020] 2)  Est. Patient Level IV [92426]

## 2010-11-12 NOTE — Progress Notes (Signed)
Summary: itching  Phone Note Call from Patient Call back at Home Phone [phone]   Caller: Patient Reason for Call: Talk to Nurse Summary of Call: Had appt last Friday, feels like he is getting worse, itching feeling radiating from lungs and under arms,pls contact Initial call taken by: Lannette Donath,  November 05, 2010 1:15 PM  Follow-up for Phone Call        called pt and states he is wheezing and has itching all over. also states he has gastro problems and is having diarrhea. Iam unclear as to what is really going with pt. Pt is aware that we sent in an inhaler and he states he is going to pick that up now and he states he has been sick for a few weeks. Pt also states that he is havig itching all over and diarrhea for 2 weeks on and off. Advised pt that he needs to schedule an appt to address his concerns Follow-up by: Avon Gully CMA, Duncan Dull),  November 05, 2010 3:54 PM  Additional Follow-up for Phone Call Additional follow up Details #1::        Lets refer to pulm for the wheezing and stop using th einhaler.  Also sertraline can cause diarrhea. DEc back down to 100mg  and see if diarrhea is better.  Additional Follow-up by: Nani Gasser MD,  November 05, 2010 4:22 PM    Additional Follow-up for Phone Call Additional follow up Details #2::    advised pt and told pt states he decreased the sertraline to  100mg . Advised  pt to schedule appt to re diarrhea and we will refer him to pulm Follow-up by: Avon Gully CMA, Duncan Dull),  November 05, 2010 4:25 PM

## 2010-11-19 NOTE — Letter (Signed)
Summary: Generalized Anxiety Disorder Scale  Generalized Anxiety Disorder Scale   Imported By: Maryln Gottron 11/13/2010 10:13:04  _____________________________________________________________________  External Attachment:    Type:   Image     Comment:   External Document

## 2010-12-02 ENCOUNTER — Telehealth: Payer: Self-pay | Admitting: Family Medicine

## 2010-12-02 MED ORDER — CLONAZEPAM 1 MG PO TABS: 1.0000 mg | ORAL_TABLET | Freq: Two times a day (BID) | ORAL | Status: DC | PRN

## 2010-12-02 NOTE — Telephone Encounter (Signed)
Pt has contacted [facility] for refill of Cl[facility]am, [facility] needs our office to contact them to process refill

## 2010-12-03 NOTE — Telephone Encounter (Signed)
Pt.notified

## 2010-12-11 ENCOUNTER — Encounter: Payer: Self-pay | Admitting: Critical Care Medicine

## 2010-12-12 ENCOUNTER — Encounter: Payer: Self-pay | Admitting: Critical Care Medicine

## 2010-12-12 ENCOUNTER — Ambulatory Visit (INDEPENDENT_AMBULATORY_CARE_PROVIDER_SITE_OTHER): Payer: BC Managed Care – PPO | Admitting: Critical Care Medicine

## 2010-12-12 VITALS — BP 122/70 | HR 97 | Temp 98.0°F | Ht 68.0 in | Wt 218.0 lb

## 2010-12-12 DIAGNOSIS — R062 Wheezing: Secondary | ICD-10-CM

## 2010-12-12 MED ORDER — BECLOMETHASONE DIPROPIONATE 40 MCG/ACT IN AERS: 2.0000 | INHALATION_SPRAY | Freq: Two times a day (BID) | RESPIRATORY_TRACT | Status: DC

## 2010-12-12 NOTE — Progress Notes (Signed)
  Subjective:    Patient ID: Brian Spears, male    DOB: 06-May-1976, 35 y.o.   MRN: [id number]  HPI    Review of Systems  Constitutional: Positive for fatigue and unexpected weight change. Negative for fever, chills, diaphoresis, activity change and appetite change.  HENT: Positive for congestion and sinus pressure. Negative for hearing loss, ear pain, nosebleeds, sore throat, facial swelling, rhinorrhea, sneezing, mouth sores, trouble swallowing, neck pain, neck stiffness, dental problem, voice change, postnasal drip, tinnitus and ear discharge.   Eyes: Negative for photophobia, discharge, itching and visual disturbance.  Respiratory: Positive for apnea, cough, shortness of breath, wheezing and stridor. Negative for choking and chest tightness.   Cardiovascular: Negative for chest pain, palpitations and leg swelling.  Gastrointestinal: Positive for abdominal distention. Negative for nausea, vomiting, abdominal pain, constipation and blood in stool.  Genitourinary: Negative for dysuria, urgency, frequency, hematuria, flank pain, decreased urine volume and difficulty urinating.  Musculoskeletal: Negative for myalgias, back pain, joint swelling, arthralgias and gait problem.  Skin: Positive for color change. Negative for pallor and rash.  Neurological: Positive for weakness and numbness. Negative for dizziness, tremors, seizures, syncope, speech difficulty, light-headedness and headaches.  Hematological: Negative for adenopathy. Does not bruise/bleed easily.  Psychiatric/Behavioral: Positive for sleep disturbance. Negative for confusion and agitation. The patient is not nervous/anxious.        Objective:   Physical Exam        Assessment & Plan:

## 2010-12-12 NOTE — Progress Notes (Signed)
Subjective:    Patient ID: Brian Spears, male    DOB: 01/28/1976, 35 y.o.   MRN: [id number]  Wheezing  This is a recurrent problem. The current episode started more than 1 month ago (since October,  started with URI/viral syndromes and cyclic). The problem occurs daily (only occurs at night when goes to sleep). The problem has been gradually improving. Associated symptoms include coughing and a fever. Pertinent negatives include no chest pain, chills, coryza, ear pain, headaches, hemoptysis, neck pain, rash, rhinorrhea, shortness of breath, sore throat, sputum production, swollen glands or vomiting. Associated symptoms comments: Had a bad cough nov to [place].  Rx zpak >32month ago and helped cough ,  At that time cough was productive and then dry.    Had fever only in 06/2010  No real heartburn.  No indigestion. The symptoms are aggravated by lying flat, URIs and occupational exposure (stress , cleaning equipment at work , if uses acetone, pt works at [employer] to process samples for the [employer].  works in the lab.  works under the hood.  Twice a year gets  wheat , barley and straw from the field to freezer and is wet with mold .). He has tried beta agonist inhalers (tried albuterol but no real relief) for the symptoms. The treatment provided no relief. His past medical history is significant for pneumonia. There is no history of asthma or chronic lung disease. pneumonia as a child only, no dx of asthma just allergies seasonal as a child   21 yo WM referred for dyspnea and wheezing  No prior dx of asthma, see symptoms above  Past Medical History  Diagnosis Date  . Hand fracture   . Normal cardiac stress test 4/10    at [facility] - negative.  terminated for fatigue  . Hyperlipidemia      Family History  Problem Relation Age of Onset  . Breast cancer Mother   . Hyperlipidemia Mother   . Hypertension Father   . Hyperlipidemia Father   . Other Other     grandfather with alcoholism  . Heart  attack Other     grandmother  . Diabetes Other     grandmother  . Colon cancer Other     uncle  . Breast cancer Other     aunt  . Lung cancer Other     grandmother  . Prostate cancer Other     grandfather  . Melanoma Other     uncle     History   Social History  . Marital Status: Married    Spouse Name: N/A    Number of Children: 1  . Years of Education: N/A   Occupational History  . lab tech for [employer]   .     Social History Main Topics  . Smoking status: Current Everyday Smoker -- 0.2 packs/day    Types: Cigarettes  . Smokeless tobacco: Current User    Types: Snuff   Comment: started smoking at age 51, started smokeless tobacco at age 64  . Alcohol Use: No  . Drug Use: No  . Sexually Active: Not on file   Other Topics Concern  . Not on file   Social History Narrative   17 years of educationMarried to [place] with 1 sonMother in law lives in the home     Allergies  Allergen Reactions  . Sulfa Antibiotics Other (See Comments)    headaches  . Naproxen Sodium      Outpatient Prescriptions Prior  to Visit  Medication Sig Dispense Refill  . clonazePAM (KLONOPIN) 1 MG tablet Take 1 tablet (1 mg total) by mouth 2 (two) times daily as needed. Once or twice a day prn for rescue anxiety  30 tablet  0  . Probiotic Product (PROBIOTIC FORMULA PO) Take 2 tablets by mouth 2 (two) times daily.       . sertraline (ZOLOFT) 100 MG tablet Take 100 mg by mouth daily. Two tabs po daily           Review of Systems  Constitutional: Positive for fever. Negative for chills.  HENT: Negative for ear pain, sore throat, rhinorrhea and neck pain.   Respiratory: Positive for cough and wheezing. Negative for hemoptysis, sputum production and shortness of breath.   Cardiovascular: Negative for chest pain.  Gastrointestinal: Negative for vomiting.  Skin: Negative for rash.  Neurological: Negative for headaches.       Objective:   Physical Exam Gen: Pleasant, well-nourished, in  no distress,  normal affect  ENT: No lesions,  mouth clear,  oropharynx clear, no postnasal drip  Neck: No JVD, no TMG, no carotid bruits  Lungs: No use of accessory muscles, no dullness to percussion, distant BS, poor airflow Cardiovascular: RRR, heart sounds normal, no murmur or gallops, no peripheral edema  Abdomen: soft and NT, no HSM,  BS normal  Musculoskeletal: No deformities, no cyanosis or clubbing  Neuro: alert, non focal  Skin: Warm, no lesions or rashes        Assessment & Plan:   Asthma exacerbation, allergic Moderate asthma Plan Qvar two puff twice daily Lab work today for allergy testing/mold sensitivity studies >>>RAST, IgE, hypersensitivity assay We will discuss a possible sleep study at the next office visit Return [place] 6 weeks     Updated Medication List Outpatient Encounter Prescriptions as of 12/12/2010  Medication Sig Dispense Refill  . clonazePAM (KLONOPIN) 1 MG tablet Take 1 tablet (1 mg total) by mouth 2 (two) times daily as needed. Once or twice a day prn for rescue anxiety  30 tablet  0  . Probiotic Product (PROBIOTIC FORMULA PO) Take 2 tablets by mouth 2 (two) times daily.       . beclomethasone (QVAR) 40 MCG/ACT inhaler Inhale 2 puffs into the lungs 2 (two) times daily.  1 Inhaler  6  . DISCONTD: sertraline (ZOLOFT) 100 MG tablet Take 100 mg by mouth daily. Two tabs po daily

## 2010-12-12 NOTE — Patient Instructions (Signed)
Qvar two puff twice daily Lab work today for allergy testing/mold sensitivity studies  We will discuss a possible sleep study at the next office visit Return [place] 6 weeks

## 2010-12-13 NOTE — Assessment & Plan Note (Signed)
Moderate asthma Plan Qvar two puff twice daily Lab work today for allergy testing/mold sensitivity studies >>>RAST, IgE, hypersensitivity assay We will discuss a possible sleep study at the next office visit Return [place] 6 weeks

## 2010-12-19 ENCOUNTER — Encounter: Payer: Self-pay | Admitting: Critical Care Medicine

## 2010-12-25 ENCOUNTER — Encounter: Payer: Self-pay | Admitting: Critical Care Medicine

## 2011-01-02 ENCOUNTER — Encounter: Payer: Self-pay | Admitting: Critical Care Medicine

## 2011-01-02 ENCOUNTER — Other Ambulatory Visit: Payer: Self-pay | Admitting: Family Medicine

## 2011-01-02 DIAGNOSIS — IMO0001 Reserved for inherently not codable concepts without codable children: Secondary | ICD-10-CM

## 2011-01-02 NOTE — Telephone Encounter (Signed)
Pt called and needs a RF of his Clonezapam 1mg  PO BID PRN.  Told pt in the future to call his pharm and let them escribe [place] for refills instead of faxing transmittals. PLAN: Routed request to provider for refill. [name], LPN [name]

## 2011-01-02 NOTE — Telephone Encounter (Signed)
Pharmacist notified that Clonazepam 1mg  (1) PO BID # 30 was sent yesterday but they don't have so a verbal order was given per Dr. [name] order this PM.  Pharmacist will notify the patient. Brian Newcomer, LPN [name]

## 2011-01-02 NOTE — Telephone Encounter (Signed)
CAll pharm and give a verbal. This was faxed over yesterday. #30 tab.

## 2011-01-09 ENCOUNTER — Encounter: Payer: Self-pay | Admitting: Family Medicine

## 2011-01-10 ENCOUNTER — Encounter: Payer: Self-pay | Admitting: Critical Care Medicine

## 2011-01-10 ENCOUNTER — Ambulatory Visit: Payer: BC Managed Care – PPO | Admitting: Family Medicine

## 2011-01-16 ENCOUNTER — Encounter: Payer: Self-pay | Admitting: Critical Care Medicine

## 2011-01-23 ENCOUNTER — Ambulatory Visit: Payer: BC Managed Care – PPO | Admitting: Critical Care Medicine

## 2011-02-05 ENCOUNTER — Telehealth: Payer: Self-pay | Admitting: Family Medicine

## 2011-02-05 NOTE — Telephone Encounter (Signed)
Darl Pikes can you check on this. If it is on hold then we dont need to send another med. It may be on hold if he is trying to fill it too early.

## 2011-02-05 NOTE — Telephone Encounter (Signed)
Patient called states that his prescription clonaze Pam is on hold at the pharmacy and that they are stating it needs to be sent escribe. Patient just needs prescription taking care of or a call back if you need to speak with him

## 2011-02-06 ENCOUNTER — Telehealth: Payer: Self-pay | Admitting: Family Medicine

## 2011-02-06 DIAGNOSIS — F419 Anxiety disorder, unspecified: Secondary | ICD-10-CM

## 2011-02-06 MED ORDER — CLONAZEPAM 1 MG PO TABS: 1.0000 mg | ORAL_TABLET | Freq: Two times a day (BID) | ORAL | Status: DC | PRN

## 2011-02-06 NOTE — Telephone Encounter (Signed)
Dr. [name] this script for pt had not been sent although the pharm had sent the request, and last refill 4-12, and recent office visit.  # 30 and no refills given by V. O. Today to [facility]. Brian Newcomer, LPN [name]

## 2011-02-07 ENCOUNTER — Ambulatory Visit: Payer: BC Managed Care – PPO | Admitting: Family Medicine

## 2011-02-10 NOTE — Telephone Encounter (Signed)
closed

## 2011-02-12 ENCOUNTER — Other Ambulatory Visit: Payer: Self-pay | Admitting: Family Medicine

## 2011-02-12 NOTE — Telephone Encounter (Signed)
Received fax for request of refill for Klonopin 1 mg but pt got # 30/0 refills on 02-06-11 and should be good til 03-08-11.  Sent notification to the pharm,. [name], LPN [name]

## 2011-03-07 ENCOUNTER — Other Ambulatory Visit: Payer: Self-pay | Admitting: Family Medicine

## 2011-03-07 DIAGNOSIS — F419 Anxiety disorder, unspecified: Secondary | ICD-10-CM

## 2011-03-07 MED ORDER — CLONAZEPAM 1 MG PO TABS: 1.0000 mg | ORAL_TABLET | Freq: Two times a day (BID) | ORAL | Status: DC | PRN

## 2011-03-13 ENCOUNTER — Other Ambulatory Visit: Payer: Self-pay | Admitting: *Deleted

## 2011-04-03 ENCOUNTER — Other Ambulatory Visit: Payer: Self-pay | Admitting: Family Medicine

## 2011-04-03 NOTE — Telephone Encounter (Signed)
Pt called and was wanting refill of his clonazepam, however; pt has requested a few days too early.  Current refill good through 04-07-11. Plan:  Notified the pt and instructed him by [facility] that he had requested refill a few days too soon.  Told the pt to call back next Monday and we will get the script taken care of that day. [name], LPN [name]

## 2011-04-07 ENCOUNTER — Other Ambulatory Visit: Payer: Self-pay | Admitting: Family Medicine

## 2011-04-07 ENCOUNTER — Other Ambulatory Visit: Payer: Self-pay | Admitting: *Deleted

## 2011-04-07 DIAGNOSIS — F419 Anxiety disorder, unspecified: Secondary | ICD-10-CM

## 2011-04-07 MED ORDER — CLONAZEPAM 1 MG PO TABS: 1.0000 mg | ORAL_TABLET | Freq: Two times a day (BID) | ORAL | Status: DC | PRN

## 2011-04-07 NOTE — Telephone Encounter (Signed)
Pt called for refill of his clonazepam 1 mg.  Recent office visit in 03-12., and due as of today for refill.  Printed off and placed for provider signature.  Will fax once signature retreived. Plan:  #30/0 refills. [name], LPN [name]'

## 2011-05-09 ENCOUNTER — Other Ambulatory Visit: Payer: Self-pay | Admitting: *Deleted

## 2011-05-09 DIAGNOSIS — F419 Anxiety disorder, unspecified: Secondary | ICD-10-CM

## 2011-05-09 MED ORDER — CLONAZEPAM 1 MG PO TABS: 1.0000 mg | ORAL_TABLET | Freq: Two times a day (BID) | ORAL | Status: DC | PRN

## 2011-06-06 ENCOUNTER — Other Ambulatory Visit: Payer: Self-pay | Admitting: *Deleted

## 2011-06-06 DIAGNOSIS — F419 Anxiety disorder, unspecified: Secondary | ICD-10-CM

## 2011-06-06 MED ORDER — CLONAZEPAM 1 MG PO TABS: 1.0000 mg | ORAL_TABLET | Freq: Two times a day (BID) | ORAL | Status: DC | PRN

## 2011-06-11 ENCOUNTER — Other Ambulatory Visit: Payer: Self-pay | Admitting: Family Medicine

## 2011-06-11 NOTE — Telephone Encounter (Signed)
Pt called for refill of his clonazepam 1 mg. Plan:  Pt notified that this medication was sent electronically on 06-06-11. [name], LPN [name]

## 2011-06-18 ENCOUNTER — Telehealth: Payer: Self-pay | Admitting: Family Medicine

## 2011-06-18 NOTE — Telephone Encounter (Signed)
Pt called and said he wants to be seen and referred for his mucosal colitis.  C/O getting sick lately with diarrhea with mucus in stool. Plan:  Pt scheduled an appt in our office, and he will mention about referral at the visit. [name], LPN [name]

## 2011-06-30 ENCOUNTER — Encounter: Payer: Self-pay | Admitting: Family Medicine

## 2011-06-30 ENCOUNTER — Ambulatory Visit (INDEPENDENT_AMBULATORY_CARE_PROVIDER_SITE_OTHER): Payer: BC Managed Care – PPO | Admitting: Family Medicine

## 2011-06-30 VITALS — BP 128/76 | HR 66 | Wt 219.0 lb

## 2011-06-30 DIAGNOSIS — E785 Hyperlipidemia, unspecified: Secondary | ICD-10-CM

## 2011-06-30 DIAGNOSIS — K5289 Other specified noninfective gastroenteritis and colitis: Secondary | ICD-10-CM

## 2011-06-30 DIAGNOSIS — K900 Celiac disease: Secondary | ICD-10-CM | POA: Insufficient documentation

## 2011-06-30 DIAGNOSIS — K529 Noninfective gastroenteritis and colitis, unspecified: Secondary | ICD-10-CM

## 2011-06-30 NOTE — Progress Notes (Signed)
  Subjective:    Patient ID: Brian Spears, male    DOB: 11-Feb-1976, 35 y.o.   MRN: [id number]  HPI Hx of mucosal colitis. He was diagnosed approximately 4 years ago. He has not seen a GI sense. Started having flares as a teenager. Mom was dx with the same think a few years ago. He still occ passes blood and stool.  She was dx with lymphocytic colitis.  He would like to see GI.  He has changed a lot of things in his diet. Has cut out fats and eatting mostly veggies.  Walks a mile to 2 a day.    Hyperlipidemia-he has really made some major dietary changes over the last year and is ready to have it rechecked.  Review of Systems     Objective:   Physical Exam  Constitutional: He is oriented to person, place, and time. He appears well-developed and well-nourished.  HENT:  Head: Normocephalic and atraumatic.  Cardiovascular: Normal rate, regular rhythm and normal heart sounds.   Pulmonary/Chest: Effort normal and breath sounds normal.  Neurological: He is alert and oriented to person, place, and time.  Skin: Skin is warm and dry.  Psychiatric: He has a normal mood and affect. His behavior is normal.          Assessment & Plan:  Mucosal colitis-we'll make new GI referral. He has not seen his previous GI in over 4 years and I believe [place]. We will schedule him with digestive health urine cortisol per his preference. He has made some dietary changes of the last year so hopefully this will help as well.  Hyperlipidemia-on having his cholesterol looked much better with the diet changes he is married. He was given a lab slip today. If he is still elevated with an LDL greater than 160 to consider starting a statin for better control. He would also like to have his thyroid checked.

## 2011-06-30 NOTE — Patient Instructions (Addendum)
We will call you with your lab results.   We will call you with the GI referral

## 2011-07-01 LAB — LIPID PANEL
HDL: 43 mg/dL (ref >39)
LDL Cholesterol: 143 mg/dL — ABNORMAL HIGH (ref 0–99)
Triglycerides: 165 mg/dL — ABNORMAL HIGH (ref <150)
VLDL: 33 mg/dL (ref 0–40)

## 2011-07-01 LAB — COMPLETE METABOLIC PANEL WITH GFR
ALT: 27 U/L (ref 0–53)
AST: 32 U/L (ref 0–37)
Alkaline Phosphatase: 59 U/L (ref 39–117)
Creat: 0.79 mg/dL (ref 0.50–1.35)
GFR, Est African American: >90 mL/min (ref >90)
Total Bilirubin: 0.4 mg/dL (ref 0.3–1.2)

## 2011-07-09 ENCOUNTER — Other Ambulatory Visit: Payer: Self-pay | Admitting: *Deleted

## 2011-07-09 MED ORDER — CLONAZEPAM 1 MG PO TABS: 1.0000 mg | ORAL_TABLET | Freq: Every evening | ORAL | Status: DC | PRN

## 2011-07-22 ENCOUNTER — Other Ambulatory Visit: Payer: Self-pay | Admitting: *Deleted

## 2011-08-05 ENCOUNTER — Other Ambulatory Visit: Payer: Self-pay | Admitting: *Deleted

## 2011-08-05 MED ORDER — CLONAZEPAM 1 MG PO TABS: 1.0000 mg | ORAL_TABLET | Freq: Every evening | ORAL | Status: DC | PRN

## 2011-09-05 ENCOUNTER — Other Ambulatory Visit: Payer: Self-pay | Admitting: *Deleted

## 2011-09-05 MED ORDER — CLONAZEPAM 1 MG PO TABS: 1.0000 mg | ORAL_TABLET | Freq: Every evening | ORAL | Status: DC | PRN

## 2011-10-05 ENCOUNTER — Emergency Department
Admission: EM | Admit: 2011-10-05 | Discharge: 2011-10-05 | Disposition: A | Discharge status: Home / Self Care | Payer: BC Managed Care – PPO | Source: Home / Self Care | Attending: Family Medicine | Admitting: Family Medicine

## 2011-10-05 ENCOUNTER — Encounter: Payer: Self-pay | Admitting: Emergency Medicine

## 2011-10-05 ENCOUNTER — Emergency Department
Admit: 2011-10-05 | Discharge: 2011-10-05 | Disposition: A | Discharge status: Home / Self Care | Payer: BC Managed Care – PPO

## 2011-10-05 DIAGNOSIS — J310 Chronic rhinitis: Secondary | ICD-10-CM

## 2011-10-05 MED ORDER — CEFDINIR 300 MG PO CAPS: 300.0000 mg | ORAL_CAPSULE | Freq: Two times a day (BID) | ORAL | Status: AC

## 2011-10-05 MED ORDER — FLUTICASONE PROPIONATE 50 MCG/ACT NA SUSP
NASAL | Status: DC
Start: 2011-10-05 — End: 2012-05-17

## 2011-10-05 NOTE — ED Provider Notes (Signed)
History     CSN: [id number]  Arrival date & time 10/05/11  1113   First MD Initiated Contact with Patient 10/05/11 1149      Chief Complaint  Patient presents with  . Facial Pain      HPI Comments: Two days ago patient awoke with nausea (but no vomiting), and facial pressure with sinus congestion.  His ears feel clogged.  No cough.  No sore throat.  No fevers, chills, and sweats.  The discomfort was not improved with ibuprofen 447m. He has a history of occasional seasonal rhinitis.  The history is provided by the patient.    Past Medical History  Diagnosis Date  . Hand fracture   . Normal cardiac stress [facility] 4/10    [facility].  terminated for fatigue  . Hyperlipidemia     Past Surgical History  Procedure Date  . Surgery on right hand     Family History  Problem Relation Age of Onset  . Breast cancer Mother   . Hyperlipidemia Mother   . Hypertension Father   . Hyperlipidemia Father   . Other Other     grandfather with alcoholism  . Heart attack Other     grandmother  . Diabetes Other     grandmother  . Colon cancer Other     uncle  . Breast cancer Other     aunt  . Lung cancer Other     grandmother  . Prostate cancer Other     grandfather  . Melanoma Other     uncle    History  Substance Use Topics  . Smoking status: Current Everyday Smoker -- 0.2 packs/day    Types: Cigarettes  . Smokeless tobacco: Current User    Types: Snuff   Comment: started smoking at age 89 started smokeless tobacco at age 36 . Alcohol Use: No      Review of Systems No sore throat No cough No pleuritic pain No wheezing + nasal congestion ? post-nasal drainage + sinus pain/pressure No itchy/red eyes No earache, but ears feel clogged No hemoptysis No SOB No fever/chills + nausea No vomiting No abdominal pain No diarrhea No urinary symptoms No skin rashes + fatigue No myalgias + headache Used OTC meds without relief  Allergies  Sulfa antibiotics  and Naproxen sodium  Home Medications   Current Outpatient Rx  Name Route Sig Dispense Refill  . BECLOMETHASONE DIPROPIONATE 40 MCG/ACT IN AERS Inhalation Inhale 2 puffs into the lungs 2 (two) times daily. 1 Inhaler 6  . CEFDINIR 300 MG PO CAPS Oral Take 1 capsule (300 mg total) by mouth 2 (two) times daily. (Rx void after 10/13/11) 20 capsule 0  . CLONAZEPAM 1 MG PO TABS Oral Take 1 tablet (1 mg total) by mouth at bedtime as needed. Once or twice a day prn for rescue anxiety 30 tablet 0  . FLUTICASONE PROPIONATE 50 MCG/ACT NA SUSP  Place 2 sprays in each nostril once daily 16 g 1  . PROBIOTIC FORMULA PO Oral Take 2 tablets by mouth 2 (two) times daily.       BP 132/87  Pulse 84  Temp(Src) 98.8 F (37.1 C) (Oral)  Resp 18  Ht 5' 8"  (1.727 m)  Wt 210 lb (95.255 kg)  BMI 31.93 kg/m2  SpO2 98%  Physical Exam Nursing notes and Vital Signs reviewed. Appearance:  Patient appears healthy, stated age, and in no acute distress Eyes:  Pupils are equal, round, and reactive to light  and accomodation.  Extraocular movement is intact.  Conjunctivae are not inflamed  Ears:  Canals normal.  Tympanic membranes normal.  Nose:  Mildly congested turbinates.  Maxillary sinus tenderness is present.  Pharynx:  Normal Neck:  Supple. No adenopathy Lungs:  Clear to auscultation.  Breath sounds are equal.  Heart:  Regular rate and rhythm without murmurs, rubs, or gallops.  Skin:  No rash present.   ED Course  Procedures  none  Labs Reviewed - No data to display Dg Sinuses Complete  10/05/2011  *RADIOLOGY REPORT*  Clinical Data: Sinus pain.  PARANASAL SINUSES - COMPLETE 3 + VIEW  Comparison: None.  Findings: The paranasal sinuses are aerated, there is no definite evidence of mucosal thickening or air-fluid levels.  No bone abnormality seen involve the paranasal sinuses.  IMPRESSION: Negative.  No radiographic evidence of sinusitis.  Original Report Authenticated By: Marlaine Hind, M.D.     1. Rhinitis         MDM  There is no evidence of bacterial infection today.   Begin Flonase nasal spray.  Take Mucinex D (guaifenesin with decongestant) twice daily for congestion, or plain Mucinex plus Sudafed.  Increase fluid intake, rest. May use Afrin nasal spray (or generic oxymetazoline) twice daily for about 5 days.  Also recommend using saline nasal spray several times daily and saline nasal irrigation (AYR is a common brand).  Use the prescription nose spray after Afrin and saline. Stop all antihistamines for now, and other non-prescription cough/cold preparations. May take Ibuprofen 256m, 4 tabs every 8 hours with food for headache Begin Omnicef if not improving about 5 days or if persistent fever develops (given Rx to hold) Followup with ENT if not improving.        STheone Murdoch MD 10/05/11 [phone]

## 2011-10-05 NOTE — ED Notes (Signed)
Facial/sinus area pain x 48 hours. Did have Flu vaccination this year. Did take Ibuprofen and Alka-Seltzer Cold med at 0900 today.

## 2011-10-07 ENCOUNTER — Other Ambulatory Visit: Payer: Self-pay | Admitting: *Deleted

## 2011-10-07 MED ORDER — CLONAZEPAM 1 MG PO TABS: 1.0000 mg | ORAL_TABLET | Freq: Every evening | ORAL | Status: DC | PRN

## 2011-11-04 ENCOUNTER — Other Ambulatory Visit: Payer: Self-pay | Admitting: *Deleted

## 2011-11-04 MED ORDER — CLONAZEPAM 1 MG PO TABS: 1.0000 mg | ORAL_TABLET | Freq: Every evening | ORAL | Status: DC | PRN

## 2011-12-02 ENCOUNTER — Other Ambulatory Visit: Payer: Self-pay | Admitting: *Deleted

## 2011-12-02 MED ORDER — CLONAZEPAM 1 MG PO TABS: 1.0000 mg | ORAL_TABLET | Freq: Every evening | ORAL | Status: DC | PRN

## 2011-12-31 ENCOUNTER — Other Ambulatory Visit: Payer: Self-pay | Admitting: *Deleted

## 2011-12-31 MED ORDER — CLONAZEPAM 1 MG PO TABS: 1.0000 mg | ORAL_TABLET | Freq: Every evening | ORAL | Status: DC | PRN

## 2012-01-27 ENCOUNTER — Telehealth: Payer: Self-pay | Admitting: *Deleted

## 2012-01-27 NOTE — Telephone Encounter (Signed)
Pt notified of MD instructions. KG LPN

## 2012-01-27 NOTE — Telephone Encounter (Signed)
Can go without until due.  Safe to do that.  Can refill on Thrusday.

## 2012-01-27 NOTE — Telephone Encounter (Signed)
Pt calls and states that he has one pill left of the Clonazepam and not due until June 1. Pt wants to know if it is ok for him to go without it or what to do. Doesn't know how he is out only thing he and wife can think of is wifes mother passed away and they were getting all meds and flushing and maybe that way.

## 2012-01-29 ENCOUNTER — Other Ambulatory Visit: Payer: Self-pay | Admitting: *Deleted

## 2012-01-29 MED ORDER — CLONAZEPAM 1 MG PO TABS: 1.0000 mg | ORAL_TABLET | Freq: Every evening | ORAL | Status: DC | PRN

## 2012-03-02 ENCOUNTER — Other Ambulatory Visit: Payer: Self-pay | Admitting: *Deleted

## 2012-03-02 MED ORDER — CLONAZEPAM 1 MG PO TABS: 1.0000 mg | ORAL_TABLET | Freq: Every evening | ORAL | Status: DC | PRN

## 2012-04-01 ENCOUNTER — Other Ambulatory Visit: Payer: Self-pay | Admitting: *Deleted

## 2012-04-01 MED ORDER — CLONAZEPAM 1 MG PO TABS: 1.0000 mg | ORAL_TABLET | Freq: Every evening | ORAL | Status: DC | PRN

## 2012-04-29 ENCOUNTER — Other Ambulatory Visit: Payer: Self-pay | Admitting: *Deleted

## 2012-04-29 MED ORDER — CLONAZEPAM 1 MG PO TABS: 1.0000 mg | ORAL_TABLET | Freq: Every evening | ORAL | Status: DC | PRN

## 2012-04-30 ENCOUNTER — Other Ambulatory Visit: Payer: Self-pay | Admitting: Family Medicine

## 2012-04-30 MED ORDER — CLONAZEPAM 1 MG PO TABS: 1.0000 mg | ORAL_TABLET | Freq: Two times a day (BID) | ORAL | Status: DC | PRN

## 2012-05-17 ENCOUNTER — Ambulatory Visit (INDEPENDENT_AMBULATORY_CARE_PROVIDER_SITE_OTHER): Payer: BC Managed Care – PPO

## 2012-05-17 ENCOUNTER — Ambulatory Visit (INDEPENDENT_AMBULATORY_CARE_PROVIDER_SITE_OTHER): Payer: BC Managed Care – PPO | Admitting: Sports Medicine

## 2012-05-17 ENCOUNTER — Encounter: Payer: Self-pay | Admitting: Sports Medicine

## 2012-05-17 VITALS — BP 124/71 | HR 95 | Temp 97.0°F | Resp 18 | Wt 223.0 lb

## 2012-05-17 DIAGNOSIS — M25521 Pain in right elbow: Secondary | ICD-10-CM | POA: Insufficient documentation

## 2012-05-17 DIAGNOSIS — G5600 Carpal tunnel syndrome, unspecified upper limb: Secondary | ICD-10-CM

## 2012-05-17 DIAGNOSIS — R21 Rash and other nonspecific skin eruption: Secondary | ICD-10-CM

## 2012-05-17 DIAGNOSIS — M25529 Pain in unspecified elbow: Secondary | ICD-10-CM

## 2012-05-17 MED ORDER — MELOXICAM 15 MG PO TABS: ORAL_TABLET | ORAL | Status: DC

## 2012-05-17 MED ORDER — CLOTRIMAZOLE-BETAMETHASONE 1-0.05 % EX CREA: TOPICAL_CREAM | Freq: Two times a day (BID) | CUTANEOUS | Status: DC

## 2012-05-17 NOTE — Assessment & Plan Note (Signed)
Wrist cockup splints to wear at night. Home rehabilitation. Mobic. Return in 4 weeks, consider ultrasound-guided neurohydrodissection if no better.

## 2012-05-17 NOTE — Assessment & Plan Note (Addendum)
The rash on his hand does appear to be a tinea manuum. I've asked him to try a Lotrisone ointment, twice a day for at least 2 weeks, and continue for 2 days after the rash is gone. If this is not resolved on his return visit we can consider a fungal scraping.

## 2012-05-17 NOTE — Assessment & Plan Note (Addendum)
Present for a year, painless, associated with grinding sound. I will get in touch with our body helix representative for a custom elbow sleeve.  I have given him the contact information for our body helix representative. Mobic. X-ray right elbow looking for ulnotrochlear joint DJD.

## 2012-05-17 NOTE — Progress Notes (Signed)
Subjective:    I'm seeing this patient as a consultation for:  Dr. Madilyn Fireman  CC: Left wrist pain, right elbow grinding.  HPI: Left wrist: He has been present on the volar aspect that radiates into his mid forearm particularly at night, and causes numbness and tingling into his first 4 fingers. He does work with typing, and handwriting. He notes that this also worsens his symptoms.  He is using occasional anti-inflammatory, but is never used any wrist wrist bracing, or any form of rehabilitation. He is also never had injection therapy. He denies any pain coming from his neck, any numbness or tingling proximal to the elbow. He denies any wrist trauma in the past. Of note, he has had a carpal tunnel release surgery on the right side.  Right elbow: No pain, just a concerning grinding type sensation. The sensation is often audible. 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 He denies any locking, and denies any swelling. There really is no pain. He has been using an elbow counter force braces which he finds moderately efficacious.  Left hand rash: There is a small, bumpy, pruritic area over his thenar eminence on his left hand. He recalls being prescribed an antifungal in the past which improved his symptoms, he then went to a dermatologist who prescribed a steroid cream, this did not work. He did note that initially it appeared as a circular, scaly lesion.  Past medical history, Surgical history, Family history, Social history, Allergies, and medications have been entered into the medical record, reviewed, and no changes needed.   Review of Systems: No headache, visual changes, nausea, vomiting, diarrhea, constipation, dizziness, abdominal pain, skin rash, fevers, chills, night sweats, weight loss, body aches, joint swelling, muscle aches, chest pain, or shortness of breath.   Objective:     Vitals:  Afebrile, vital signs stable. General: Well Developed, well nourished, and in no acute distress.  Neuro/Psych: Alert and oriented x3, extra-ocular muscles intact, able to move all 4 extremities.  Skin: Warm and dry, there is a small, 2 cm area that is bumpy on his left thenar eminence. Otherwise negative Respiratory: Not using accessory muscles, speaking in full sentences, trachea midline.  Cardiovascular: Pulses palpable, no extremity edema. Abdomen: Does not appear distended. Right Elbow: Unremarkable to inspection. Range of motion full pronation, supination, flexion, extension. There is a small amount of crepitus that is audible, and palpable over the ulnotrochlear groove as the elbow is taken through the range of motion. Strength is full to all of the above directions Stable to varus, valgus stress. Negative moving valgus stress test. No discrete areas of tenderness to palpation. Ulnar nerve does not sublux. Negative cubital tunnel Tinel's.  Left Wrist: Inspection normal with no visible erythema or swelling. ROM smooth and normal with good flexion and extension and ulnar/radial deviation that is symmetrical with opposite wrist. Palpation is normal over metacarpals, navicular, lunate, and TFCC; tendons without tenderness/ swelling No snuffbox tenderness. No tenderness over Canal of Guyon. Strength 5/5 in all directions without pain. Negative Finkelstein sign. Positive Tinel's, and positive Phalen's test.  Numbness persisted for several minutes after the tests are completed. Negative Watson's test.  Biceps, brachial radialis, triceps reflexes were all normal bilaterally, he also has a negative Hoffman sign bilaterally. Neck has full range of motion. Negative Spurling's sign on both sides.  Impression and Recommendations:   This case  required medical decision making of moderate complexity.

## 2012-05-31 ENCOUNTER — Other Ambulatory Visit: Payer: Self-pay | Admitting: *Deleted

## 2012-05-31 ENCOUNTER — Other Ambulatory Visit: Payer: Self-pay | Admitting: Family Medicine

## 2012-05-31 MED ORDER — CLONAZEPAM 1 MG PO TABS: 1.0000 mg | ORAL_TABLET | Freq: Two times a day (BID) | ORAL | Status: DC | PRN

## 2012-06-07 ENCOUNTER — Other Ambulatory Visit: Payer: Self-pay | Admitting: Sports Medicine

## 2012-06-07 ENCOUNTER — Encounter: Payer: Self-pay | Admitting: Sports Medicine

## 2012-06-07 ENCOUNTER — Ambulatory Visit (INDEPENDENT_AMBULATORY_CARE_PROVIDER_SITE_OTHER): Payer: BC Managed Care – PPO | Admitting: Sports Medicine

## 2012-06-07 VITALS — BP 113/73 | HR 83 | Wt 219.0 lb

## 2012-06-07 DIAGNOSIS — H6980 Other specified disorders of Eustachian tube, unspecified ear: Secondary | ICD-10-CM | POA: Insufficient documentation

## 2012-06-07 DIAGNOSIS — F411 Generalized anxiety disorder: Secondary | ICD-10-CM

## 2012-06-07 DIAGNOSIS — E785 Hyperlipidemia, unspecified: Secondary | ICD-10-CM

## 2012-06-07 DIAGNOSIS — G5600 Carpal tunnel syndrome, unspecified upper limb: Secondary | ICD-10-CM

## 2012-06-07 DIAGNOSIS — Z298 Encounter for other specified prophylactic measures: Secondary | ICD-10-CM

## 2012-06-07 DIAGNOSIS — Z0000 Encounter for general adult medical examination without abnormal findings: Secondary | ICD-10-CM | POA: Insufficient documentation

## 2012-06-07 DIAGNOSIS — H6990 Unspecified Eustachian tube disorder, unspecified ear: Secondary | ICD-10-CM | POA: Insufficient documentation

## 2012-06-07 DIAGNOSIS — Z87891 Personal history of nicotine dependence: Secondary | ICD-10-CM | POA: Insufficient documentation

## 2012-06-07 DIAGNOSIS — Z23 Encounter for immunization: Secondary | ICD-10-CM

## 2012-06-07 DIAGNOSIS — M25521 Pain in right elbow: Secondary | ICD-10-CM

## 2012-06-07 DIAGNOSIS — Z299 Encounter for prophylactic measures, unspecified: Secondary | ICD-10-CM

## 2012-06-07 DIAGNOSIS — J310 Chronic rhinitis: Secondary | ICD-10-CM

## 2012-06-07 LAB — COMPREHENSIVE METABOLIC PANEL
Albumin: 4.4 g/dL (ref 3.5–5.2)
BUN: 10 mg/dL (ref 6–23)
CO2: 28 mEq/L (ref 19–32)
Calcium: 9.5 mg/dL (ref 8.4–10.5)
Chloride: 107 mEq/L (ref 96–112)
Creat: 0.82 mg/dL (ref 0.50–1.35)
Potassium: 4.6 mEq/L (ref 3.5–5.3)

## 2012-06-07 LAB — LIPID PANEL
Cholesterol: 206 mg/dL — ABNORMAL HIGH (ref 0–200)
HDL: 39 mg/dL — ABNORMAL LOW (ref >39)
LDL Cholesterol: 148 mg/dL — ABNORMAL HIGH (ref 0–99)
Total CHOL/HDL Ratio: 5.3 Ratio
Triglycerides: 95 mg/dL (ref <150)
VLDL: 19 mg/dL (ref 0–40)

## 2012-06-07 LAB — COMPREHENSIVE METABOLIC PANEL WITH GFR
ALT: 31 U/L (ref 0–53)
AST: 20 U/L (ref 0–37)
Alkaline Phosphatase: 58 U/L (ref 39–117)
Glucose, Bld: 86 mg/dL (ref 70–99)
Sodium: 140 meq/L (ref 135–145)
Total Bilirubin: 0.4 mg/dL (ref 0.3–1.2)
Total Protein: 7 g/dL (ref 6.0–8.3)

## 2012-06-07 MED ORDER — FLUTICASONE PROPIONATE 50 MCG/ACT NA SUSP: NASAL | Status: DC

## 2012-06-07 MED ORDER — LORATADINE 10 MG PO TABS: 10.0000 mg | ORAL_TABLET | Freq: Every day | ORAL | Status: DC

## 2012-06-07 MED ORDER — ATORVASTATIN CALCIUM 40 MG PO TABS: 40.0000 mg | ORAL_TABLET | Freq: Every day | ORAL | Status: DC

## 2012-06-07 MED ORDER — BUSPIRONE HCL 15 MG PO TABS: ORAL_TABLET | ORAL | Status: DC

## 2012-06-07 MED ORDER — CLONAZEPAM 0.25 MG PO TBDP: ORAL_TABLET | ORAL | Status: DC

## 2012-06-07 NOTE — Assessment & Plan Note (Signed)
Fasting, we'll recheck lipids today.

## 2012-06-07 NOTE — Assessment & Plan Note (Signed)
Conservative treatment has been inefficacious so far. We will perform hydrodissection at the next visit.

## 2012-06-07 NOTE — Assessment & Plan Note (Signed)
We discussed, and agreed on a gradual taper from the clonazepam.  we will cut the dose in half for a week, half again for a week, then stop. We will also start BuSpar.

## 2012-06-07 NOTE — Assessment & Plan Note (Signed)
Complete physical today. Tdap, influenza vaccines. Come back in a year for this.

## 2012-06-07 NOTE — Assessment & Plan Note (Signed)
Resolved with meloxicam, and elbow sleeve. He does note some diarrhea with meloxicam, but is unaware whether its causal. He will stop for a week, and see if GI symptoms resolve.

## 2012-06-07 NOTE — Assessment & Plan Note (Signed)
Loratadine. Flonase.

## 2012-06-07 NOTE — Addendum Note (Signed)
Addended by: Silverio Decamp on: 06/07/2012 04:56 PM   Modules accepted: Orders

## 2012-06-07 NOTE — Progress Notes (Signed)
Subjective:    CC: Complete physical, multiple complaints.  HPI: Anxiety: Has been on clonazepam at bedtime for some time now. Has had bad reactions to SSRIs, as well as antihistamines in the past. When he tries to come off the clonazepam, he exhibits increased anxiety, palpitations, sweating. He is on 1 mg, and stopped all at once. He is wondering what else he can do to get off of the clonazepam, and if there's anything else he can take for his anxiety.  Carpal tunnel syndrome: Overall improved, and desires injection.  Runny nose: Has been using daily Sudafed. When he stopped the Sudafed, symptoms become worse suggestive of rhinitis medicamentosa.  He wonders anything else can be used, he has never used an intranasal steroid.  Right elbow pain: Completely resolved with body helix elbow sleeve.  Past medical history, Surgical history, Family history, Social history, Allergies, and medications have been entered into the medical record, reviewed, and no changes needed.   Review of Systems: No fevers, chills, night sweats, weight loss, chest pain, or shortness of breath.   Objective:    General: Well Developed, well nourished, and in no acute distress.  Neuro: Alert and oriented x3, extra-ocular muscles intact.  HEENT: Normocephalic, atraumatic, pupils equal round reactive to light, neck supple, no masses, no lymphadenopathy, thyroid nonpalpable.  Skin: Warm and dry, no rashes. Cardiac: Regular rate and rhythm, no murmurs rubs or gallops.  Respiratory: Clear to auscultation bilaterally. Not using accessory muscles, speaking in full sentences. Abdomen: Soft, nontender, nondistended, positive bowel sounds.  Impression and Recommendations:

## 2012-06-09 ENCOUNTER — Encounter: Payer: BC Managed Care – PPO | Admitting: Sports Medicine

## 2012-06-12 ENCOUNTER — Emergency Department
Admission: EM | Admit: 2012-06-12 | Discharge: 2012-06-12 | Disposition: A | Discharge status: Home / Self Care | Payer: BC Managed Care – PPO | Source: Home / Self Care | Attending: Family Medicine | Admitting: Family Medicine

## 2012-06-12 DIAGNOSIS — R309 Painful micturition, unspecified: Secondary | ICD-10-CM

## 2012-06-12 DIAGNOSIS — R300 Dysuria: Secondary | ICD-10-CM

## 2012-06-12 LAB — POCT URINALYSIS DIP (MANUAL ENTRY)
Bilirubin, UA: NEGATIVE
Blood, UA: NEGATIVE
Glucose, UA: NEGATIVE
Nitrite, UA: NEGATIVE
Urobilinogen, UA: 0.2 (ref 0–1)

## 2012-06-12 NOTE — ED Provider Notes (Signed)
History     CSN: [id number]  Arrival date & time 06/12/12  40   First MD Initiated Contact with Patient 06/12/12 1637      Chief Complaint  Patient presents with  . Dysuria    2 days      HPI Comments: Brian Spears complains of burning during urination and pressure two days days ago, resolved.  He has not had the burning today. He has had a change in his medication and is concerned this may have caused the burning during urination. Dr Dianah Field is stepping him down from clonazepam and started him on Buspar. Denies fever, chills or sweats.   He notes that his wife has a history of genital herpes but he has had no rash.  Patient is a 36 y.o. male presenting with dysuria. The history is provided by the patient.  Dysuria  This is a new problem. The current episode started 2 days ago. The problem has been resolved. The quality of the pain is described as burning. The pain is mild. There has been no fever. Pertinent negatives include no chills, no sweats, no nausea, no discharge, no frequency, no hematuria, no hesitancy, no urgency and no flank pain. He has tried nothing for the symptoms.    Past Medical History  Diagnosis Date  . Hand fracture   . Normal cardiac stress test 4/10    at [facility] - negative.  terminated for fatigue  . Hyperlipidemia     Past Surgical History  Procedure Date  . Surgery on right hand     Family History  Problem Relation Age of Onset  . Breast cancer Mother   . Hyperlipidemia Mother   . Hypertension Father   . Hyperlipidemia Father   . Other Other     grandfather with alcoholism  . Heart attack Other     grandmother  . Diabetes Other     grandmother  . Colon cancer Other     uncle  . Breast cancer Other     aunt  . Lung cancer Other     grandmother  . Prostate cancer Other     grandfather  . Melanoma Other     uncle    History  Substance Use Topics  . Smoking status: Former Smoker -- 0.2 packs/day    Types: Cigarettes  . Smokeless tobacco:  Former Systems developer    Types: Snuff    Quit date: 06/02/2011   Comment: started smoking at age 37, started smokeless tobacco at age 39  . Alcohol Use: No      Review of Systems  Constitutional: Negative for chills.  Gastrointestinal: Negative for nausea.  Genitourinary: Positive for dysuria. Negative for hesitancy, urgency, frequency, hematuria and flank pain.    Allergies  Sulfa antibiotics and Naproxen sodium  Home Medications   Current Outpatient Rx  Name Route Sig Dispense Refill  . ATORVASTATIN CALCIUM 40 MG PO TABS Oral Take 1 tablet (40 mg total) by mouth daily. 90 tablet 3  . BUSPIRONE HCL 15 MG PO TABS  One half tab 3 times a day for one week, then one whole tab 3 times a day. 90 tablet 3  . CLONAZEPAM 0.25 MG PO TBDP  2 tabs each bedtime for one week, then one tab each bedtime for one week, then stop. 21 tablet 0  . CLOTRIMAZOLE-BETAMETHASONE 1-0.05 % EX CREA Topical Apply topically 2 (two) times daily. 45 g 0  . FLUTICASONE PROPIONATE 50 MCG/ACT NA SUSP  One spray in each  nostril twice a day, use left hand for right nostril, and right hand for left nostril. 48 g 3  . LORATADINE 10 MG PO TABS Oral Take 1 tablet (10 mg total) by mouth daily. 30 tablet 2  . MELOXICAM 15 MG PO TABS  One tab PO qAM with breakfast for 2 weeks, then daily prn pain. 30 tablet 3  . PROBIOTIC FORMULA PO Oral Take 2 tablets by mouth 2 (two) times daily.       BP 110/73  Pulse 74  Temp 98 F (36.7 C) (Oral)  Resp 18  Wt 218 lb (98.884 kg)  SpO2 99%  Physical Exam Nursing notes and Vital Signs reviewed. Appearance:  Patient appears healthy, stated age, and in no acute distress Eyes:  Pupils are equal, round, and reactive to light and accomodation.  Extraocular movement is intact.  Conjunctivae are not inflamed  Pharynx:  Normal Neck:  Supple.  No adenopathy Lungs:  Clear to auscultation.  Breath sounds are equal.  Heart:  Regular rate and rhythm without murmurs, rubs, or gallops.  Abdomen:   Nontender without masses or hepatosplenomegaly.  Bowel sounds are present.  No CVA or flank tenderness.  Extremities:  No edema.   Skin:  No rash present.   Genitourinary:  Penis normal without lesions or urethral discharge.  Scrotum is normal.  Testes are descended bilaterally without nodules or tenderness.  No hernias are palpated.  No regional lymphadenopathy palpated  ED Course  Procedures  none   Labs Reviewed  URINE CULTURE pending POCT Urinalysis (dipstick):  negative      1. Dysuria   2. Painful urination       MDM  Normal exam.  There is no evidence of bacterial infection today.   Urine culture pending. Continue increased fluid intake. Followup with Family Doctor if not improved in one week.         Kandra Nicolas, MD 06/15/12 [phone]

## 2012-06-12 NOTE — ED Notes (Signed)
Brian Spears complains of burning during urination and pressure Thursday and Friday. He has not had the burning today. He has had a change in his medication and is concerned this may have caused the burning during urination. Dr Benjamin Stain is stepping him down from clonazepam and started him on Buspar. Denies fever, chills or sweats.

## 2012-06-13 LAB — URINE CULTURE: Organism ID, Bacteria: NO GROWTH

## 2012-06-14 ENCOUNTER — Encounter: Payer: Self-pay | Admitting: Sports Medicine

## 2012-06-14 ENCOUNTER — Ambulatory Visit (INDEPENDENT_AMBULATORY_CARE_PROVIDER_SITE_OTHER): Payer: BC Managed Care – PPO | Admitting: Sports Medicine

## 2012-06-14 VITALS — BP 122/65 | HR 98 | Temp 97.8°F | Wt 220.0 lb

## 2012-06-14 DIAGNOSIS — G5600 Carpal tunnel syndrome, unspecified upper limb: Secondary | ICD-10-CM

## 2012-06-14 DIAGNOSIS — F411 Generalized anxiety disorder: Secondary | ICD-10-CM

## 2012-06-14 NOTE — Assessment & Plan Note (Signed)
Left Median nerve hydrodissection as above. He will come back to see me in approximately 3 weeks. Continue to wear night splinting, and the rehabilitation exercises.

## 2012-06-14 NOTE — Assessment & Plan Note (Signed)
Doing well with buspirone, off of all benzodiazepines.

## 2012-06-14 NOTE — Progress Notes (Signed)
Subjective:    CC: Followup left carpal tunnel syndrome  HPI: Carpal tunnel syndrome: Brian Spears returns after having failed night bracing, and oral medications, as well as home rehabilitation exercises for his carpal tunnel syndrome. He has never had an injection, and desires this today.  Anxiety: We switched him off of his benzodiazepines, and added BuSpar. He did have a few days of malaise on the BuSpar, but this has since resolved, and he feels pretty good. He does note that his father has been on this for a long time with excellent response. He was too drowsy during the day on clonazepam.  Past medical history, Surgical history, Family history, Social history, Allergies, and medications have been entered into the medical record, reviewed, and no changes needed.   Review of Systems: No fevers, chills, night sweats, weight loss, chest pain, or shortness of breath.   Objective:    General: Well Developed, well nourished, and in no acute distress.   Procedure: Real-time Ultrasound Guided hydrodissection of left median nerve. Device: GE Logiq E  Ultrasound guided injection is preferred based studies that show increased duration, increased effect, greater accuracy, decreased procedural pain, increased response rate, and decreased cost with ultrasound guided versus blind injection.  Verbal informed consent obtained.  Time-out conducted.  Noted no overlying erythema, induration, or other signs of local infection.  Skin prepped in a sterile fashion.  Local anesthesia: Topical Ethyl chloride.  With sterile technique and under real time ultrasound guidance:  I injected a total of 1 cc of Kenalog 40, and 4 cc of lidocaine in a hydrodissection of, and superficial to the median nerve, dissecting it from surrounding structures. No contact was made with the nerve during the procedure. Completed without difficulty  Pain immediately resolved suggesting accurate placement of the medication.  Advised to call  if fevers/chills, erythema, induration, drainage, or persistent bleeding.  Images permanently stored and available for review in the ultrasound unit.  Impression: Technically successful ultrasound guided Hydro-dissection.   Impression and Recommendations:

## 2012-06-15 ENCOUNTER — Telehealth: Payer: Self-pay | Admitting: *Deleted

## 2012-06-15 NOTE — Telephone Encounter (Signed)
Pt notified of MD instructions. KG LPN

## 2012-06-15 NOTE — Telephone Encounter (Signed)
Pt coming off the Lorazepam and on 1/2 tab of the Buspar because 1 whole tab makes him feel real loopy. Just wanted you to know will be taking 1/2 tab of the Buspar

## 2012-06-15 NOTE — Telephone Encounter (Signed)
Sounds ok for now, please ask him if he doesn't feel like his anxiety is controlled in 2 weeks to increase back to a whole tab. Preesh!

## 2012-06-16 ENCOUNTER — Telehealth: Payer: Self-pay | Admitting: Emergency Medicine

## 2012-06-29 ENCOUNTER — Encounter: Payer: Self-pay | Admitting: Sports Medicine

## 2012-06-29 ENCOUNTER — Ambulatory Visit (INDEPENDENT_AMBULATORY_CARE_PROVIDER_SITE_OTHER): Payer: BC Managed Care – PPO | Admitting: Sports Medicine

## 2012-06-29 VITALS — BP 126/75 | HR 84 | Wt 217.0 lb

## 2012-06-29 DIAGNOSIS — G5600 Carpal tunnel syndrome, unspecified upper limb: Secondary | ICD-10-CM

## 2012-06-29 DIAGNOSIS — J0100 Acute maxillary sinusitis, unspecified: Secondary | ICD-10-CM

## 2012-06-29 MED ORDER — AMOXICILLIN-POT CLAVULANATE 875-125 MG PO TABS: 1.0000 | ORAL_TABLET | Freq: Two times a day (BID) | ORAL | Status: DC

## 2012-06-29 NOTE — Assessment & Plan Note (Signed)
Left-sided carpal tunnel symptoms remain 100% resolved after median nerve hydrodissection.

## 2012-06-29 NOTE — Progress Notes (Signed)
Subjective:    CC: Sinus pain  HPI:  Arvo reports a several-day history of clogged ears, sore throat, sinus pressure. The pain is localized over his maxillary sinuses, and does not radiate.   He denies cough, fevers, chills. Denies rash. Denies sick contacts.  Left-sided carpal tunnel syndrome: I performed an ultrasound guided median nerve hydrodissection at the last visit, symptoms continue to be 100% resolved.  Past medical history, Surgical history, Family history, Social history, Allergies, and medications have been entered into the medical record, reviewed, and no changes needed.   Review of Systems: No fevers, chills, night sweats, weight loss, chest pain, or shortness of breath.   Objective:    General: Well Developed, well nourished, and in no acute distress.  Neuro: Alert and oriented x3, extra-ocular muscles intact.  HEENT: Normocephalic, atraumatic, pupils equal round reactive to light, neck supple, no masses, no lymphadenopathy, thyroid nonpalpable.  Skin: Warm and dry, no rashes. Cardiac: Regular rate and rhythm, no murmurs rubs or gallops.  Respiratory: Clear to auscultation bilaterally. Not using accessory muscles, speaking in full sentences.   Impression and Recommendations:

## 2012-06-29 NOTE — Patient Instructions (Addendum)

## 2012-06-29 NOTE — Assessment & Plan Note (Signed)
Augmentin for 10 days, Flonase, neti pot, return to clinic as needed.

## 2012-07-07 ENCOUNTER — Telehealth: Payer: Self-pay | Admitting: *Deleted

## 2012-07-07 NOTE — Telephone Encounter (Signed)
Pt called to let you know that he has stopped the Buspar. States  That he felt like he had to void all the time, sensitivity to light, ringing in ears and hands felt jerky. States he has not taken it for 4 days and that he feels much better.  FYI

## 2012-07-08 ENCOUNTER — Encounter: Payer: Self-pay | Admitting: Sports Medicine

## 2012-07-08 ENCOUNTER — Telehealth: Payer: Self-pay | Admitting: *Deleted

## 2012-07-08 ENCOUNTER — Ambulatory Visit (INDEPENDENT_AMBULATORY_CARE_PROVIDER_SITE_OTHER): Payer: BC Managed Care – PPO | Admitting: Sports Medicine

## 2012-07-08 VITALS — BP 130/79 | HR 93 | Wt 216.0 lb

## 2012-07-08 DIAGNOSIS — J0100 Acute maxillary sinusitis, unspecified: Secondary | ICD-10-CM

## 2012-07-08 DIAGNOSIS — R1013 Epigastric pain: Secondary | ICD-10-CM | POA: Insufficient documentation

## 2012-07-08 DIAGNOSIS — M940 Chondrocostal junction syndrome [Tietze]: Secondary | ICD-10-CM

## 2012-07-08 DIAGNOSIS — F411 Generalized anxiety disorder: Secondary | ICD-10-CM

## 2012-07-08 DIAGNOSIS — G5600 Carpal tunnel syndrome, unspecified upper limb: Secondary | ICD-10-CM

## 2012-07-08 MED ORDER — MELOXICAM 15 MG PO TABS: ORAL_TABLET | ORAL | Status: DC

## 2012-07-08 MED ORDER — AZITHROMYCIN 250 MG PO TABS: ORAL_TABLET | ORAL | Status: DC

## 2012-07-08 NOTE — Progress Notes (Signed)
Subjective:    CC: Followup, xiphoid pain  HPI: Xiphoid pain: This is been present for about a week, he localized pain over the xiphoid process, as well as the right costal margin. Worse with palpation, worse with deep breathing, no worse with activity, no change with food. No trauma.  Acute sinusitis: Finishing up a ten-day course of Augmentin, still notes symptoms are present.   Generalized anxiety disorder: He did stop his BuSpar which was working very well, as he was worried it was because of his sinusitis, and epigastric pain.  Past medical history, Surgical history, Family history, Social history, Allergies, and medications have been entered into the medical record, reviewed, and no changes needed.   Review of Systems: No fevers, chills, night sweats, weight loss, chest pain, or shortness of breath.   Objective:    General: Well Developed, well nourished, and in no acute distress.  Neuro: Alert and oriented x3, extra-ocular muscles intact.  HEENT: Normocephalic, atraumatic, pupils equal round reactive to light, neck supple, no masses, no lymphadenopathy, thyroid nonpalpable.  Skin: Warm and dry, no rashes. Cardiac: Regular rate and rhythm, no murmurs rubs or gallops.  Respiratory: Clear to auscultation bilaterally. Not using accessory muscles, speaking in full sentences. Abdomen: Soft, nondistended, nontender, normal bowel sounds, no palpable masses. There is tenderness to palpation over the xiphoid, as was the right costal margin.  Impression and Recommendations:

## 2012-07-08 NOTE — Assessment & Plan Note (Signed)
Overall unimproved with a ten-day course of Augmentin. Continue Flonase, we'll try a course of azithromycin. May use Imodium, and probiotics if desired with azithromycin.

## 2012-07-08 NOTE — Telephone Encounter (Signed)
Pt called and states he is having sensitivity to light and ringing in the ears and soreness around the "xyphoid process." Advised pt that if he is having chest pain then he should go to ED. Pt states it is more of a soreness and not chest pain. Pt denies SOB or numbness nasuea, ect.. Pt seems to think he is having SE from medications.Advised to schedule appt with Dr. Karie Schwalbe for further eval since stopped Buspar

## 2012-07-08 NOTE — Assessment & Plan Note (Signed)
Mobic. Return to clinic when necessary in 2 weeks.

## 2012-07-08 NOTE — Assessment & Plan Note (Signed)
Symptoms continue to be resolved after ultrasound-guided hydro-dissection of the median nerve on the left side.

## 2012-07-08 NOTE — Assessment & Plan Note (Signed)
Patient stopped BuSpar with current symptoms. We discussed and he will restart.

## 2012-07-15 ENCOUNTER — Telehealth: Payer: Self-pay | Admitting: *Deleted

## 2012-07-15 DIAGNOSIS — J329 Chronic sinusitis, unspecified: Secondary | ICD-10-CM

## 2012-07-15 NOTE — Telephone Encounter (Signed)
We need a CT of the sinuses, as well as a chest x-ray. I will order this for down stairs.

## 2012-07-15 NOTE — Telephone Encounter (Signed)
Pt states he has scheduled an appt to see you on Friday. He states he has had 2 rounds of abx and states he is no better. He is asking if maybe he should get an xray before he sees you tomorrow since he isn't any better. Please advise.

## 2012-07-15 NOTE — Telephone Encounter (Signed)
PA obtained for CT of sinus good from 07/15/12 to 08/13/12. Auth # [id number]. Pt informed of radiology appt.

## 2012-07-16 ENCOUNTER — Ambulatory Visit (INDEPENDENT_AMBULATORY_CARE_PROVIDER_SITE_OTHER): Payer: BC Managed Care – PPO

## 2012-07-16 ENCOUNTER — Encounter: Payer: Self-pay | Admitting: Sports Medicine

## 2012-07-16 ENCOUNTER — Ambulatory Visit (INDEPENDENT_AMBULATORY_CARE_PROVIDER_SITE_OTHER): Payer: BC Managed Care – PPO | Admitting: Sports Medicine

## 2012-07-16 VITALS — BP 131/76 | HR 80 | Resp 18 | Ht 68.0 in | Wt 218.0 lb

## 2012-07-16 DIAGNOSIS — J342 Deviated nasal septum: Secondary | ICD-10-CM

## 2012-07-16 DIAGNOSIS — R079 Chest pain, unspecified: Secondary | ICD-10-CM

## 2012-07-16 DIAGNOSIS — J329 Chronic sinusitis, unspecified: Secondary | ICD-10-CM

## 2012-07-16 DIAGNOSIS — R059 Cough, unspecified: Secondary | ICD-10-CM

## 2012-07-16 DIAGNOSIS — Z87891 Personal history of nicotine dependence: Secondary | ICD-10-CM

## 2012-07-16 DIAGNOSIS — F411 Generalized anxiety disorder: Secondary | ICD-10-CM

## 2012-07-16 DIAGNOSIS — R05 Cough: Secondary | ICD-10-CM

## 2012-07-16 NOTE — Progress Notes (Signed)
Subjective:    CC: Followup  HPI: He comes back to see me regarding a sensation of tightness/pinching over his maxillary sinuses. He's been through a course of Augmentin for 10 days, azithromycin, he's been using nasal irrigation, as well as intranasal corticosteroids. Overall his symptoms are unchanged. He has noted that when he packs down off of his BuSpar his symptoms improved. Is wondering if he can try to come off of the BuSpar altogether.  Past medical history, Surgical history, Family history, Social history, Allergies, and medications have been entered into the medical record, reviewed, and no changes needed.   Review of Systems: No fevers, chills, night sweats, weight loss, chest pain, or shortness of breath.   Objective:    General: Well Developed, well nourished, and in no acute distress.  Neuro: Alert and oriented x3, extra-ocular muscles intact.  HEENT: Normocephalic, atraumatic, pupils equal round reactive to light, neck supple, no masses, no lymphadenopathy, thyroid nonpalpable.  Skin: Warm and dry, no rashes. Cardiac: Regular rate and rhythm, no murmurs rubs or gallops.  Respiratory: Clear to auscultation bilaterally. Not using accessory muscles, speaking in full sentences.  I reviewed his chest x-ray, and CT of his maxillary sinuses, both were negative.  Impression and Recommendations:

## 2012-07-16 NOTE — Assessment & Plan Note (Signed)
Facial pressure may in fact be due to an odd side effect from the BuSpar. He does note that his anxiety is under good control, and I will endorse him tapering off of the BuSpar. He does plan to bring in his son to establish care, he can make an appointment to see me on the same day.

## 2012-07-27 ENCOUNTER — Telehealth: Payer: Self-pay | Admitting: Family Medicine

## 2012-07-27 NOTE — Telephone Encounter (Signed)
Only if ok with Dr. [name].

## 2012-07-27 NOTE — Telephone Encounter (Signed)
I called patient and he would like to change providers to Dr. Darene Lamer due to him being a male provider and now his son will be a new patient of Dr. Mcneil Sober next week. Thanks.

## 2012-07-27 NOTE — Telephone Encounter (Signed)
Updated PCP.

## 2012-08-04 ENCOUNTER — Ambulatory Visit: Payer: BC Managed Care – PPO | Admitting: Sports Medicine

## 2012-09-16 ENCOUNTER — Ambulatory Visit (INDEPENDENT_AMBULATORY_CARE_PROVIDER_SITE_OTHER): Payer: BC Managed Care – PPO | Admitting: Sports Medicine

## 2012-09-16 ENCOUNTER — Encounter: Payer: Self-pay | Admitting: Sports Medicine

## 2012-09-16 VITALS — BP 117/80 | HR 83 | Wt 219.0 lb

## 2012-09-16 DIAGNOSIS — R1013 Epigastric pain: Secondary | ICD-10-CM

## 2012-09-16 DIAGNOSIS — M940 Chondrocostal junction syndrome [Tietze]: Secondary | ICD-10-CM

## 2012-09-16 MED ORDER — OMEPRAZOLE 40 MG PO CPDR: 40.0000 mg | DELAYED_RELEASE_CAPSULE | Freq: Every day | ORAL | Status: DC

## 2012-09-16 MED ORDER — RANITIDINE HCL 300 MG PO TABS: 300.0000 mg | ORAL_TABLET | Freq: Two times a day (BID) | ORAL | Status: DC

## 2012-09-16 NOTE — Progress Notes (Signed)
Subjective:    CC: Followup  HPI: He returns for followup of his xiphoid process pain. I diagnosed him with costochondritis the last visit and provided Mobic which was only minimal benefit. He has more information today, and notes he has pain in the epigastrium and radiates up into his chest when he lays flat. He also notes that his sinusitis is improved, he still gets symptoms of abnormal sounds in his right ear. He also noted that the symptoms were particularly bad after Thanksgiving where he ate more than usual. Occasionally this causes difficulty swallowing as well. He denies any sour brash, and has not treated this with any acid blockers in the past. He does occasionally endorse a tightness in the left side of his chest it does not radiate, and is not associated with nausea, diaphoresis, or presyncope. He did have a stress test approximately 3 weeks ago for similar symptoms that was negative.  Past medical history, Surgical history, Family history not pertinant except as noted below, Social history, Allergies, and medications have been entered into the medical record, reviewed, and no changes needed.   Review of Systems: No fevers, chills, night sweats, weight loss, chest pain, or shortness of breath.   Objective:    General: Well Developed, well nourished, and in no acute distress.  Neuro: Alert and oriented x3, extra-ocular muscles intact, sensation grossly intact.  HEENT: Normocephalic, atraumatic, pupils equal round reactive to light, neck supple, no masses, no lymphadenopathy, thyroid nonpalpable.  Skin: Warm and dry, no rashes. Cardiac: Regular rate and rhythm, no murmurs rubs or gallops.  Respiratory: Clear to auscultation bilaterally. Not using accessory muscles, speaking in full sentences. Abdomen: Soft, nontender, nondistended, normal bowel sounds.  Impression and Recommendations:

## 2012-09-16 NOTE — Assessment & Plan Note (Signed)
With pain radiating through the chest and back when lying flat, recurrent sinusitis, as well as eustachian tube dysfunction type symptoms. This most likely represents dyspepsia, we will start Prilosec twice a day, and Zantac twice a day. I would also like a barium swallow study. Like to see him back in 4 weeks, and if no better we can certainly proceed down the pathway of stress testing. I am not starting with this as he did have a stress test 3 years ago that was negative, and this was done for similar symptoms.

## 2012-09-22 ENCOUNTER — Telehealth: Payer: Self-pay | Admitting: *Deleted

## 2012-09-22 NOTE — Telephone Encounter (Signed)
Pt states he went to GI doc and they found nothing abnormal. Pt is asking if he should start back on Meloxicam and if he should go to a chiropractor. States GI doc thinks it is costochondrocitis. Please advise.

## 2012-09-22 NOTE — Telephone Encounter (Signed)
Costochondritis is possible/likely, with some of his other symptoms I want him to be sure to take the Prilosec and ranitidine twice a day. He concerns go back on meloxicam, if he doesn't have any I am happy to call some in. Chiropractic care may also be useful.

## 2012-09-22 NOTE — Telephone Encounter (Signed)
Pt informed

## 2012-09-27 ENCOUNTER — Ambulatory Visit (INDEPENDENT_AMBULATORY_CARE_PROVIDER_SITE_OTHER): Payer: BC Managed Care – PPO | Admitting: Sports Medicine

## 2012-09-27 ENCOUNTER — Encounter: Payer: Self-pay | Admitting: Sports Medicine

## 2012-09-27 VITALS — BP 142/95 | HR 87 | Wt 219.0 lb

## 2012-09-27 DIAGNOSIS — R1013 Epigastric pain: Secondary | ICD-10-CM

## 2012-09-27 DIAGNOSIS — J342 Deviated nasal septum: Secondary | ICD-10-CM

## 2012-09-27 MED ORDER — NITROGLYCERIN 0.4 MG SL SUBL: 0.4000 mg | SUBLINGUAL_TABLET | SUBLINGUAL | Status: DC | PRN

## 2012-09-27 NOTE — Assessment & Plan Note (Addendum)
Symptoms may represent esophageal spasm. Nitroglycerin for use when symptoms occur, if relieve symptoms, this may cardiac versus esophageal. If symptoms improve, could certainly try a long-acting calcium channel blocker or nitrate  As his stress test was actually 6 years ago, certainly we should consider repeating this. Would like him to see our cardiologist down stairs.

## 2012-09-27 NOTE — Assessment & Plan Note (Signed)
With recurrent facial, nasal, and right-sided ear pressure we will refer to ENT.

## 2012-09-27 NOTE — Progress Notes (Signed)
Subjective:    CC: Multiple issues  HPI:  He is a very pleasant 37 year old male with anxiety disorder who comes in to discuss:  Abnormal hearing sensation: This is not resolved, he describes a fluttering or a flapping-type sensation in his ears. I treated him for eustachian tube dysfunction without any benefit.  Epigastric pain: Notes intermittent episodes of tightness. This occurs at rest, and is not associated with anxiety or sense of impending doom. He does not exertional fever. He's currently experiencing episode. He doesn't have a problem swallowing food during these episodes. He also denies any diaphoresis, nausea, or radiation of the pain. I have been treating him with an H2 blocker as well as a proton pump inhibitor with only minimal improvement. He continues to describe these episodes as intermittent tightness in the epigastric region, behind the chest, and in the back of the throat. He had a stress test approximately 6 years ago that was negative. He's currently off of his BuSpar, symptoms were improved while on this medication. Recent diagnosis from gastroenterology was costochondritis. Mobic has not been effective in controlling this.  Sinus pressure: Negative CT of the sinuses with the exception of a rightward deviated nasal septum. Has been through multiple courses of antibiotics without improvement.   Past medical history, Surgical history, Family history not pertinant except as noted below, Social history, Allergies, and medications have been entered into the medical record, reviewed, and no changes needed.   Review of Systems: No fevers, chills, night sweats, weight loss, chest pain, or shortness of breath.   Objective:    General: Well Developed, well nourished, and in no acute distress.  Neuro: Alert and oriented x3, extra-ocular muscles intact, sensation grossly intact.  HEENT: Normocephalic, atraumatic, pupils equal round reactive to light, neck supple, no masses, no  lymphadenopathy, thyroid nonpalpable.  Skin: Warm and dry, no rashes. Cardiac: Regular rate and rhythm, no murmurs rubs or gallops.  Respiratory: Clear to auscultation bilaterally. Not using accessory muscles, speaking in full sentences.  Impression and Recommendations:    I spent 25 minutes with this patient, greater than 50% was face-to-face time counseling regarding fluttering in ears, and epigastric pain.

## 2012-10-07 ENCOUNTER — Encounter: Payer: Self-pay | Admitting: Sports Medicine

## 2012-10-27 ENCOUNTER — Ambulatory Visit: Payer: BC Managed Care – PPO | Admitting: Cardiology

## 2012-11-30 ENCOUNTER — Encounter: Payer: Self-pay | Admitting: Sports Medicine

## 2012-11-30 ENCOUNTER — Ambulatory Visit (INDEPENDENT_AMBULATORY_CARE_PROVIDER_SITE_OTHER): Payer: BC Managed Care – PPO | Admitting: Sports Medicine

## 2012-11-30 VITALS — BP 124/80 | HR 91 | Wt 218.0 lb

## 2012-11-30 DIAGNOSIS — H6990 Unspecified Eustachian tube disorder, unspecified ear: Secondary | ICD-10-CM

## 2012-11-30 DIAGNOSIS — R1013 Epigastric pain: Secondary | ICD-10-CM

## 2012-11-30 DIAGNOSIS — H6980 Other specified disorders of Eustachian tube, unspecified ear: Secondary | ICD-10-CM

## 2012-11-30 DIAGNOSIS — G5600 Carpal tunnel syndrome, unspecified upper limb: Secondary | ICD-10-CM

## 2012-11-30 DIAGNOSIS — E785 Hyperlipidemia, unspecified: Secondary | ICD-10-CM

## 2012-11-30 DIAGNOSIS — S90212A Contusion of left great toe with damage to nail, initial encounter: Secondary | ICD-10-CM

## 2012-11-30 DIAGNOSIS — B351 Tinea unguium: Secondary | ICD-10-CM | POA: Insufficient documentation

## 2012-11-30 NOTE — Assessment & Plan Note (Signed)
Will call back in set of injection on his own time.

## 2012-11-30 NOTE — Assessment & Plan Note (Signed)
Seems to be well-controlled with nasal steroids. He is followed by otolaryngology.

## 2012-11-30 NOTE — Progress Notes (Signed)
  Subjective:    CC: Followup  HPI: Discoloration on toe: On left great toe, present for a few weeks, has been spending a lot of time on his feet. Recently went to a chiropractor who is going to build him some orthotics.  Epigastric pain: A set him up to see cardiologist, and he did have a stress test coming up which he is canceled. Of note, he had a negative stress test 6 years ago. He was actually seen in the hospital sometime ago at [facility], a cardiac rule out was negative. He is never tried the nitroglycerin that we prescribed earlier for possible esophageal spasm.  Carpal tunnel syndrome: Thinks he may be needing another injection. Symptoms are returning.  Ear pain: Went to nose and throat, diagnosed her with eustachian tube dysfunction, he has been using "O-nasl" spray which he says is working well.  He is now off of all of his medications except for the above, this was done by choice.  Past medical history, Surgical history, Family history not pertinant except as noted below, Social history, Allergies, and medications have been entered into the medical record, reviewed, and no changes needed.   Review of Systems: No fevers, chills, night sweats, weight loss, chest pain, or shortness of breath.   Objective:    General: Well Developed, well nourished, and in no acute distress.  Neuro: Alert and oriented x3, extra-ocular muscles intact, sensation grossly intact.  HEENT: Normocephalic, atraumatic, pupils equal round reactive to light, neck supple, no masses, no lymphadenopathy, thyroid nonpalpable.  Skin: Warm and dry, no rashes. Cardiac: Regular rate and rhythm, no murmurs rubs or gallops.  Respiratory: Clear to auscultation bilaterally. Not using accessory muscles, speaking in full sentences. Left foot: There is bruising over the medial aspect of the great toe under the toenail. No sign of infection or paronychia.  Impression and Recommendations:

## 2012-11-30 NOTE — Assessment & Plan Note (Signed)
Has canceled all cardiologist followup. Has not used nitroglycerin. I suspect this is most likely related to his anxiety.

## 2012-11-30 NOTE — Assessment & Plan Note (Signed)
Will likely self resolve. Return as needed for this. Depending on how much orthotics cost him at the chiropractor's office he may have [place] make them as we do not charge for the period

## 2012-12-01 ENCOUNTER — Ambulatory Visit: Payer: BC Managed Care – PPO | Admitting: Cardiology

## 2012-12-28 ENCOUNTER — Encounter: Payer: Self-pay | Admitting: Sports Medicine

## 2012-12-28 ENCOUNTER — Ambulatory Visit (INDEPENDENT_AMBULATORY_CARE_PROVIDER_SITE_OTHER): Payer: BC Managed Care – PPO | Admitting: Sports Medicine

## 2012-12-28 VITALS — BP 134/87 | HR 89 | Wt 216.0 lb

## 2012-12-28 DIAGNOSIS — G5600 Carpal tunnel syndrome, unspecified upper limb: Secondary | ICD-10-CM

## 2012-12-28 DIAGNOSIS — G5602 Carpal tunnel syndrome, left upper limb: Secondary | ICD-10-CM

## 2012-12-28 DIAGNOSIS — M25579 Pain in unspecified ankle and joints of unspecified foot: Secondary | ICD-10-CM

## 2012-12-28 NOTE — Progress Notes (Addendum)
    Patient was fitted for a : standard, cushioned, semi-rigid orthotic. The orthotic was heated and afterward the patient stood on the orthotic blank positioned on the orthotic stand. The patient was positioned in subtalar neutral position and 10 degrees of ankle dorsiflexion in a weight bearing stance. After completion of molding, a stable base was applied to the orthotic blank. The blank was ground to a stable position for weight bearing. Size: 10 Base: Blue EVA Additional Posting and Padding: None The patient ambulated these, and they were very comfortable.   I spent 40 minutes with this patient, greater than 50% was face-to-face time counseling exclusively regarding the diagnosis of bilateral foot pain.  Procedure: Real-time Ultrasound Guided hydrodissection of left median nerve. Device: GE Logiq E  Ultrasound guided injection is preferred based studies that show increased duration, increased effect, greater accuracy, decreased procedural pain, increased response rate, and decreased cost with ultrasound guided versus blind injection.  Verbal informed consent obtained.  Time-out conducted.  Noted no overlying erythema, induration, or other signs of local infection.  Skin prepped in a sterile fashion.  Local anesthesia: Topical Ethyl chloride.  With sterile technique and under real time ultrasound guidance:  Needle advanced, and median nerve was dissected off of the transverse carpal ligament, total of 4 cc lidocaine 1 cc Kenalog 40 was injected. Completed without difficulty  Pain immediately resolved suggesting accurate placement of the medication.  Advised to call if fevers/chills, erythema, induration, drainage, or persistent bleeding.  Images permanently stored and available for review in the ultrasound unit.  Impression: Technically successful ultrasound guided injection.  This injection was done separately from the above time spent for the orthotics for foot pain.

## 2012-12-28 NOTE — Assessment & Plan Note (Addendum)
Excellent response since this was done previously in September of 2013. Left median nerve hydrodissection as above. Return as needed for this.

## 2012-12-29 DIAGNOSIS — M25579 Pain in unspecified ankle and joints of unspecified foot: Secondary | ICD-10-CM | POA: Insufficient documentation

## 2012-12-29 NOTE — Assessment & Plan Note (Signed)
Custom orthotics made for pain in feet bilaterally. Return in approximately 4 weeks for this.

## 2013-01-13 ENCOUNTER — Telehealth: Payer: Self-pay | Admitting: *Deleted

## 2013-01-13 DIAGNOSIS — IMO0001 Reserved for inherently not codable concepts without codable children: Secondary | ICD-10-CM

## 2013-01-13 DIAGNOSIS — M25579 Pain in unspecified ankle and joints of unspecified foot: Secondary | ICD-10-CM

## 2013-01-13 NOTE — Telephone Encounter (Signed)
Pt states that he has gotten the symptoms with the problems with ears. States he has called the ENT back but they are at a loss on what to do. He does state last Sept-Oct that he did find lots of ticks on his body. He would like to be tested for Lyme Disease and any thing else you think might find out what is going on.

## 2013-01-13 NOTE — Telephone Encounter (Signed)
Orders placed.  Laboratories requisition is in my box.

## 2013-01-14 NOTE — Telephone Encounter (Signed)
Pt informed.  Chancey Cullinane, LPN  

## 2013-01-15 LAB — SEDIMENTATION RATE: Sed Rate: 1 mm/hr (ref 0–16)

## 2013-01-17 LAB — ROCKY MTN SPOTTED FVR ABS PNL(IGG+IGM)
RMSF IgG: 0.33 IV
RMSF IgM: 0.27 IV

## 2013-01-17 LAB — EHRLICHIA ANTIBODY PANEL
E chaffeensis (HGE) Ab, IgG: NEGATIVE
E chaffeensis (HGE) Ab, IgM: NEGATIVE

## 2013-01-18 LAB — B. BURGDORFI ANTIBODIES: B burgdorferi Ab IgG+IgM: 1.08 {ISR} — ABNORMAL HIGH

## 2013-01-18 LAB — B. BURGDORFI ANTIBODIES BY WB
B burgdorferi IgG Abs (IB): NEGATIVE
B burgdorferi IgM Abs (IB): NEGATIVE

## 2013-01-18 MED ORDER — DOXYCYCLINE HYCLATE 100 MG PO TABS: 100.0000 mg | ORAL_TABLET | Freq: Two times a day (BID) | ORAL | Status: AC

## 2013-01-18 NOTE — Addendum Note (Signed)
Addended by: Monica Becton on: 01/18/2013 11:17 AM   Modules accepted: Orders

## 2013-01-18 NOTE — Assessment & Plan Note (Signed)
Ehrlichia and [facility] titers are negative, Borrelia burgdorferi titers are weakly positive, treating with doxycycline for 14 days for suspected Lyme's disease.

## 2013-01-27 ENCOUNTER — Encounter: Payer: Self-pay | Admitting: Sports Medicine

## 2013-01-27 ENCOUNTER — Ambulatory Visit (INDEPENDENT_AMBULATORY_CARE_PROVIDER_SITE_OTHER): Payer: BC Managed Care – PPO | Admitting: Sports Medicine

## 2013-01-27 VITALS — BP 117/78 | HR 91 | Wt 218.0 lb

## 2013-01-27 DIAGNOSIS — M25579 Pain in unspecified ankle and joints of unspecified foot: Secondary | ICD-10-CM

## 2013-01-27 DIAGNOSIS — S90212D Contusion of left great toe with damage to nail, subsequent encounter: Secondary | ICD-10-CM

## 2013-01-27 DIAGNOSIS — H6980 Other specified disorders of Eustachian tube, unspecified ear: Secondary | ICD-10-CM

## 2013-01-27 DIAGNOSIS — H6981 Other specified disorders of Eustachian tube, right ear: Secondary | ICD-10-CM

## 2013-01-27 DIAGNOSIS — H6990 Unspecified Eustachian tube disorder, unspecified ear: Secondary | ICD-10-CM

## 2013-01-27 DIAGNOSIS — H6991 Unspecified Eustachian tube disorder, right ear: Secondary | ICD-10-CM

## 2013-01-27 DIAGNOSIS — Z5189 Encounter for other specified aftercare: Secondary | ICD-10-CM

## 2013-01-27 MED ORDER — PRAMOXINE-HC-CHLOROXYLENOL 10-10-1 MG/ML OT SOLN: 4.0000 [drp] | Freq: Three times a day (TID) | OTIC | Status: DC

## 2013-01-27 NOTE — Assessment & Plan Note (Signed)
Resolving

## 2013-01-27 NOTE — Progress Notes (Signed)
  Subjective:    CC: Followup  HPI: Right ear pain: Has a diagnosis of eustachian tube dysfunction, has already seen ENT, they think that he may need myringotomy tubes. He also gets good response to intranasal steroids.  Foot and ankle pain: Resolved with custom orthotics.  Subungual hematoma: Resolving.  Positive Lyme titers: Treated with doxycycline. No further symptoms.  Past medical history, Surgical history, Family history not pertinant except as noted below, Social history, Allergies, and medications have been entered into the medical record, reviewed, and no changes needed.   Review of Systems: No fevers, chills, night sweats, weight loss, chest pain, or shortness of breath.   Objective:    General: Well Developed, well nourished, and in no acute distress.  Neuro: Alert and oriented x3, extra-ocular muscles intact, sensation grossly intact.  HEENT: Normocephalic, atraumatic, pupils equal round reactive to light, neck supple, no masses, no lymphadenopathy, thyroid nonpalpable.  Skin: Warm and dry, no rashes. Cardiac: Regular rate and rhythm, no murmurs rubs or gallops, no lower extremity edema.  Respiratory: Clear to auscultation bilaterally. Not using accessory muscles, speaking in full sentences. Impression and Recommendations:

## 2013-01-27 NOTE — Assessment & Plan Note (Signed)
Resolved with orthotics. 

## 2013-01-27 NOTE — Assessment & Plan Note (Signed)
Continues to do well with QNasl. It was recommended that he have myringotomy tubes. I think that this is a good idea. QNasl provided, as well as ear drops

## 2013-01-31 ENCOUNTER — Other Ambulatory Visit: Payer: Self-pay | Admitting: Sports Medicine

## 2013-01-31 DIAGNOSIS — H6981 Other specified disorders of Eustachian tube, right ear: Secondary | ICD-10-CM

## 2013-01-31 DIAGNOSIS — H6991 Unspecified Eustachian tube disorder, right ear: Secondary | ICD-10-CM

## 2013-01-31 MED ORDER — FLUOCINOLONE ACETONIDE 0.01 % OT OIL: 5.0000 [drp] | TOPICAL_OIL | Freq: Two times a day (BID) | OTIC | Status: DC | PRN

## 2013-02-03 ENCOUNTER — Encounter: Payer: Self-pay | Admitting: Sports Medicine

## 2013-02-03 ENCOUNTER — Ambulatory Visit (INDEPENDENT_AMBULATORY_CARE_PROVIDER_SITE_OTHER): Payer: BC Managed Care – PPO | Admitting: Sports Medicine

## 2013-02-03 VITALS — BP 114/82 | HR 89

## 2013-02-03 DIAGNOSIS — M25579 Pain in unspecified ankle and joints of unspecified foot: Secondary | ICD-10-CM

## 2013-02-03 NOTE — Assessment & Plan Note (Signed)
I created Mindy's second set of orthotics. They fit his boot appropriately. Return as needed for this.

## 2013-02-03 NOTE — Patient Instructions (Addendum)
Look into trigeminal neuralgia. Take a bit out of a sour lemon, and see if it generates the same sensation in your cheeks.

## 2013-02-03 NOTE — Progress Notes (Signed)
    Patient was fitted for a : standard, cushioned, semi-rigid orthotic. The orthotic was heated and afterward the patient stood on the orthotic blank positioned on the orthotic stand. The patient was positioned in subtalar neutral position and 10 degrees of ankle dorsiflexion in a weight bearing stance. After completion of molding, a stable base was applied to the orthotic blank. The blank was ground to a stable position for weight bearing. Size:10 Base: Blue EVA Additional Posting and Padding: None The patient ambulated these, and they were very comfortable.  I spent 40 minutes with this patient, greater than 50% was face-to-face time counseling regarding the below diagnosis.   

## 2013-02-04 ENCOUNTER — Telehealth: Payer: Self-pay | Admitting: *Deleted

## 2013-02-04 MED ORDER — CYCLOBENZAPRINE HCL 10 MG PO TABS: ORAL_TABLET | ORAL | Status: DC

## 2013-02-04 NOTE — Telephone Encounter (Signed)
Lemon was supposed to worsen symptoms, if it worsened symptoms, then its just parotid gland, eat bland/non-citrus foods for now and take some OTC analgesics, his pain is probably from parotid gland (salivary) contraction and is benign..no way to cure that other than avoid sour foods.

## 2013-02-04 NOTE — Telephone Encounter (Signed)
Pt states he tried the lemon and it didn't help. He also states that he looked into what you said about the trigeminal neuralgia. He states the pain is worse today than yesterday and would like your advise.

## 2013-02-04 NOTE — Telephone Encounter (Signed)
Will try flexeril, patient describes this as muscle spasm on side of head.

## 2013-02-07 ENCOUNTER — Telehealth: Payer: Self-pay | Admitting: *Deleted

## 2013-02-07 NOTE — Telephone Encounter (Signed)
Sounds like a viral illness versus continued symptoms from the Lyme disease. Have him do some neck stretches, and some oral analgesics. He should try to take pictures of the swelling before it goes away.

## 2013-02-07 NOTE — Telephone Encounter (Signed)
Pt states the Flexeril we sent to pharmacy didn't help this weekend. He also states that the lymph nodes on his neck swelled to the size of golf balls and the RT side of his face swoll up and shoulder and neck has knots in it also. Please advise.  Oscar La, LPN

## 2013-02-07 NOTE — Telephone Encounter (Signed)
Pt informed.  Aston Lieske, LPN  

## 2013-02-14 ENCOUNTER — Telehealth: Payer: Self-pay | Admitting: *Deleted

## 2013-02-14 MED ORDER — FLUCONAZOLE 150 MG PO TABS: 150.0000 mg | ORAL_TABLET | Freq: Once | ORAL | Status: DC

## 2013-02-14 NOTE — Telephone Encounter (Signed)
Pt called stating that he thinks he might have yeast infection and that may be why he is having all of these problems with the neck and jaw. States he ate bbq sauce on the bbq he ate and then had A1 sauce the next day. States after these he started with issues again at neck and face. States he then ate cake and bread yesterday and the same thing happened again. He says his mom has this issue and would like to know if you can test him for yeast with blood or can we give him some thing for yeast.  Please advise.  Meyer Cory, LPN

## 2013-02-14 NOTE — Telephone Encounter (Signed)
Pt informed.  Oscar La, LPN

## 2013-02-14 NOTE — Telephone Encounter (Signed)
There is no blood test for this, but I can provide him with a single Diflucan tablet which he medicates yeast infection. This is one tab oral times one.

## 2013-02-16 ENCOUNTER — Telehealth: Payer: Self-pay

## 2013-02-16 NOTE — Telephone Encounter (Signed)
Pt states he took Diflucan and his hands and ears do feel better, possibly his concern is whether or not he should take another Diflucan. Pt states that he feels it is all yeast related. Please Advise.

## 2013-02-17 MED ORDER — FLUCONAZOLE 200 MG PO TABS: 200.0000 mg | ORAL_TABLET | ORAL | Status: DC

## 2013-02-17 NOTE — Telephone Encounter (Signed)
Calling in Diflucan 200 mg weekly.

## 2013-02-17 NOTE — Telephone Encounter (Signed)
Pt informed  Of instructions and that the Diflucan was a one time dose. He states that the problems are still back and he wants to know if he needs to come back in sooner than his appt.

## 2013-02-17 NOTE — Telephone Encounter (Signed)
A single dose of Diflucan is all that is needed to eradicate the yeast.

## 2013-02-17 NOTE — Telephone Encounter (Signed)
Brian Spears,CMA

## 2013-02-17 NOTE — Telephone Encounter (Signed)
Pt states that it is back. Rhonda Cunningham,CMA

## 2013-02-17 NOTE — Telephone Encounter (Signed)
Slightly confused, just said his hands and ears are better..?  Now its back?

## 2013-03-01 ENCOUNTER — Encounter: Payer: Self-pay | Admitting: Sports Medicine

## 2013-03-01 ENCOUNTER — Ambulatory Visit (INDEPENDENT_AMBULATORY_CARE_PROVIDER_SITE_OTHER): Payer: BC Managed Care – PPO | Admitting: Sports Medicine

## 2013-03-01 VITALS — BP 135/81 | HR 90 | Wt 219.0 lb

## 2013-03-01 DIAGNOSIS — R21 Rash and other nonspecific skin eruption: Secondary | ICD-10-CM

## 2013-03-01 DIAGNOSIS — R6884 Jaw pain: Secondary | ICD-10-CM

## 2013-03-01 DIAGNOSIS — S90212D Contusion of left great toe with damage to nail, subsequent encounter: Secondary | ICD-10-CM

## 2013-03-01 DIAGNOSIS — K219 Gastro-esophageal reflux disease without esophagitis: Secondary | ICD-10-CM

## 2013-03-01 NOTE — Progress Notes (Signed)
  Subjective:    CC: Followup  HPI: Fungal infection of the hands: Started to clear up with Diflucan 200 mg weekly.  Right-sided jaw pain: Recently had a Panorex at his dentist's office, there was a fairly large area of radiolucency in the right mandibular process.  Sore throat: Has recently come off of his acid blocker. Now has sore throat, sour brash, hoarseness.   Past medical history, Surgical history, Family history not pertinant except as noted below, Social history, Allergies, and medications have been entered into the medical record, reviewed, and no changes needed.   Review of Systems: No fevers, chills, night sweats, weight loss, chest pain, or shortness of breath.   Objective:    General: Well Developed, well nourished, and in no acute distress.  Neuro: Alert and oriented x3, extra-ocular muscles intact, sensation grossly intact.  HEENT: Normocephalic, atraumatic, pupils equal round reactive to light, neck supple, no masses, no lymphadenopathy, thyroid nonpalpable. Oropharynx, nasopharynx, extremity or canals are unremarkable, Right-sided mandibular process is nontender to palpation. Skin: Warm and dry, no rashes. Cardiac: Regular rate and rhythm, no murmurs rubs or gallops, no lower extremity edema.  Respiratory: Clear to auscultation bilaterally. Not using accessory muscles, speaking in full sentences.  Panorex reviewed, there is a large area of radiolucency in the right jaw mandibular process.  Impression and Recommendations:

## 2013-03-01 NOTE — Assessment & Plan Note (Signed)
Right-sided, there is a radiolucent structure in the right mandible, this was noted on Panorex done at his dentist office. At this point I would like to CT his jaw to ensure that this is likely just a simple bone cyst.

## 2013-03-01 NOTE — Assessment & Plan Note (Signed)
Continues to improve 

## 2013-03-01 NOTE — Assessment & Plan Note (Signed)
Resolving with Diflucan 200 weekly.

## 2013-03-01 NOTE — Assessment & Plan Note (Signed)
Needs to restart ranitidine 300 at bedtime.

## 2013-03-02 ENCOUNTER — Other Ambulatory Visit: Payer: Self-pay

## 2013-03-02 MED ORDER — CYCLOBENZAPRINE HCL 10 MG PO TABS: ORAL_TABLET | ORAL | Status: DC

## 2013-03-03 ENCOUNTER — Ambulatory Visit (INDEPENDENT_AMBULATORY_CARE_PROVIDER_SITE_OTHER): Payer: BC Managed Care – PPO

## 2013-03-03 DIAGNOSIS — R6884 Jaw pain: Secondary | ICD-10-CM

## 2013-03-03 MED ORDER — IOHEXOL 300 MG/ML  SOLN
75.0000 mL | Freq: Once | INTRAMUSCULAR | Status: AC | PRN
  Administered 2013-03-03: 75 mL via INTRAVENOUS

## 2013-03-10 ENCOUNTER — Telehealth: Payer: Self-pay | Admitting: Sports Medicine

## 2013-03-10 NOTE — Telephone Encounter (Signed)
He should be able to just make an appt.  Let me know if he actually needs to referral.

## 2013-03-10 NOTE — Telephone Encounter (Signed)
Patient request to know if he can have a referral to Dr. Mackie Pai. Thanks

## 2013-03-14 ENCOUNTER — Telehealth: Payer: Self-pay | Admitting: Sports Medicine

## 2013-03-14 DIAGNOSIS — R7689 Other specified abnormal immunological findings in serum: Secondary | ICD-10-CM

## 2013-03-14 DIAGNOSIS — R768 Other specified abnormal immunological findings in serum: Secondary | ICD-10-CM

## 2013-03-14 NOTE — Telephone Encounter (Signed)
Patient needs a referral to see Dr. Estanislado Pandy. Thanks

## 2013-03-14 NOTE — Telephone Encounter (Signed)
Done

## 2013-05-16 ENCOUNTER — Encounter: Payer: Self-pay | Admitting: Sports Medicine

## 2013-05-16 ENCOUNTER — Ambulatory Visit (INDEPENDENT_AMBULATORY_CARE_PROVIDER_SITE_OTHER): Payer: BC Managed Care – PPO | Admitting: Sports Medicine

## 2013-05-16 VITALS — BP 133/84 | HR 84 | Wt 221.0 lb

## 2013-05-16 DIAGNOSIS — G5600 Carpal tunnel syndrome, unspecified upper limb: Secondary | ICD-10-CM

## 2013-05-16 DIAGNOSIS — Z299 Encounter for prophylactic measures, unspecified: Secondary | ICD-10-CM

## 2013-05-16 DIAGNOSIS — G5602 Carpal tunnel syndrome, left upper limb: Secondary | ICD-10-CM

## 2013-05-16 DIAGNOSIS — E785 Hyperlipidemia, unspecified: Secondary | ICD-10-CM

## 2013-05-16 NOTE — Assessment & Plan Note (Signed)
Checking some routine blood work. He will come back in October for a physical.

## 2013-05-16 NOTE — Progress Notes (Signed)
  Subjective:    CC: Carpal tunnel syndrome  HPI: Brian Spears's last left median nerve Hydrodissection was approximately 5 months ago. He is status post right carpal tunnel surgical release. He presents with worsening carpal tunnel syndrome, numbness and tingling in the first 3 fingers, with pain radiating up into the forearm, worse with activity, better with rest, moderate, persistent.  Lyme disease: symptoms continue to improve as time passes, has been one year since his tick bite.  Hyperlipidemia: Stable on current medications, due for a recheck.  Past medical history, Surgical history, Family history not pertinant except as noted below, Social history, Allergies, and medications have been entered into the medical record, reviewed, and no changes needed.   Review of Systems: No fevers, chills, night sweats, weight loss, chest pain, or shortness of breath.   Objective:    General: Well Developed, well nourished, and in no acute distress.  Neuro: Alert and oriented x3, extra-ocular muscles intact, sensation grossly intact.  HEENT: Normocephalic, atraumatic, pupils equal round reactive to light, neck supple, no masses, no lymphadenopathy, thyroid nonpalpable.  Skin: Warm and dry, no rashes. Cardiac: Regular rate and rhythm, no murmurs rubs or gallops, no lower extremity edema.  Respiratory: Clear to auscultation bilaterally. Not using accessory muscles, speaking in full sentences.  Procedure: Real-time Ultrasound Guided Injection of left carpal tunnel Device: GE Logiq E  Verbal informed consent obtained.  Time-out conducted.  Noted no overlying erythema, induration, or other signs of local infection.  Skin prepped in a sterile fashion.  Local anesthesia: Topical Ethyl chloride.  With sterile technique and under real time ultrasound guidance:  Median nerve hydrodissected from surrounding structures, a total 1 cc Kenalog 40, 4 cc lidocaine injected. Completed without difficulty  Pain  immediately resolved suggesting accurate placement of the medication.  Advised to call if fevers/chills, erythema, induration, drainage, or persistent bleeding.  Images permanently stored and available for review in the ultrasound unit.  Impression: Technically successful ultrasound guided injection.  Impression and Recommendations:

## 2013-05-16 NOTE — Assessment & Plan Note (Signed)
Rechecking lipids. 

## 2013-05-16 NOTE — Assessment & Plan Note (Signed)
Left-sided median nerve hydrodissection. Last one was 5 months ago. Return as needed for this.

## 2013-05-20 LAB — COMPREHENSIVE METABOLIC PANEL
ALT: 34 U/L (ref 0–53)
Alkaline Phosphatase: 58 U/L (ref 39–117)
CO2: 26 mEq/L (ref 19–32)
Creat: 0.82 mg/dL (ref 0.50–1.35)
Glucose, Bld: 89 mg/dL (ref 70–99)
Total Bilirubin: 0.5 mg/dL (ref 0.3–1.2)

## 2013-05-20 LAB — COMPREHENSIVE METABOLIC PANEL WITH GFR
AST: 20 U/L (ref 0–37)
Albumin: 4.3 g/dL (ref 3.5–5.2)
BUN: 15 mg/dL (ref 6–23)
Calcium: 9.6 mg/dL (ref 8.4–10.5)
Chloride: 104 meq/L (ref 96–112)
Potassium: 4.4 meq/L (ref 3.5–5.3)
Sodium: 138 meq/L (ref 135–145)
Total Protein: 7.5 g/dL (ref 6.0–8.3)

## 2013-05-20 LAB — CBC
HCT: 42.3 % (ref 39.0–52.0)
Hemoglobin: 14.7 g/dL (ref 13.0–17.0)
MCH: 28.9 pg (ref 26.0–34.0)
MCHC: 34.8 g/dL (ref 30.0–36.0)
MCV: 83.1 fL (ref 78.0–100.0)
Platelets: 294 10*3/uL (ref 150–400)
RBC: 5.09 MIL/uL (ref 4.22–5.81)
RDW: 13.7 % (ref 11.5–15.5)
WBC: 10.2 K/uL (ref 4.0–10.5)

## 2013-05-20 LAB — LIPID PANEL
Cholesterol: 233 mg/dL — ABNORMAL HIGH (ref 0–200)
HDL: 50 mg/dL (ref >39)
LDL Cholesterol: 163 mg/dL — ABNORMAL HIGH (ref 0–99)
Total CHOL/HDL Ratio: 4.7 Ratio
Triglycerides: 100 mg/dL (ref <150)
VLDL: 20 mg/dL (ref 0–40)

## 2013-05-20 LAB — HEMOGLOBIN A1C
Hgb A1c MFr Bld: 5.6 % (ref <5.7)
Mean Plasma Glucose: 114 mg/dL (ref <117)

## 2013-05-23 LAB — TESTOSTERONE, FREE, TOTAL, SHBG
Sex Hormone Binding: 32 nmol/L (ref 13–71)
Testosterone, Free: 39.7 pg/mL — ABNORMAL LOW (ref 47.0–244.0)
Testosterone-% Free: 1.9 % (ref 1.6–2.9)
Testosterone: 205 ng/dL — ABNORMAL LOW (ref 300–890)

## 2013-06-01 ENCOUNTER — Ambulatory Visit (INDEPENDENT_AMBULATORY_CARE_PROVIDER_SITE_OTHER): Payer: BC Managed Care – PPO | Admitting: Sports Medicine

## 2013-06-01 ENCOUNTER — Encounter: Payer: Self-pay | Admitting: Sports Medicine

## 2013-06-01 VITALS — BP 121/78 | HR 84 | Wt 217.0 lb

## 2013-06-01 DIAGNOSIS — G5601 Carpal tunnel syndrome, right upper limb: Secondary | ICD-10-CM

## 2013-06-01 DIAGNOSIS — E291 Testicular hypofunction: Secondary | ICD-10-CM

## 2013-06-01 DIAGNOSIS — H6120 Impacted cerumen, unspecified ear: Secondary | ICD-10-CM

## 2013-06-01 DIAGNOSIS — Z299 Encounter for prophylactic measures, unspecified: Secondary | ICD-10-CM

## 2013-06-01 DIAGNOSIS — Z0000 Encounter for general adult medical examination without abnormal findings: Secondary | ICD-10-CM

## 2013-06-01 DIAGNOSIS — H6121 Impacted cerumen, right ear: Secondary | ICD-10-CM

## 2013-06-01 DIAGNOSIS — E785 Hyperlipidemia, unspecified: Secondary | ICD-10-CM

## 2013-06-01 DIAGNOSIS — Z23 Encounter for immunization: Secondary | ICD-10-CM

## 2013-06-01 MED ORDER — RED YEAST RICE 600 MG PO TABS: 2.0000 | ORAL_TABLET | Freq: Three times a day (TID) | ORAL | Status: DC

## 2013-06-01 MED ORDER — TESTOSTERONE CYPIONATE 200 MG/ML IM SOLN
200.0000 mg | INTRAMUSCULAR | Status: DC
  Administered 2013-06-01: 200 mg via INTRAMUSCULAR

## 2013-06-01 NOTE — Assessment & Plan Note (Signed)
Complete physical performed today. 

## 2013-06-01 NOTE — Assessment & Plan Note (Signed)
Symptoms resolved after median nerve hydrodissection last visit.

## 2013-06-01 NOTE — Assessment & Plan Note (Signed)
Starting testosterone supplementation, risks and benefits discussed. 200 mg every 2 weeks. We will recheck levels exactly one week after his second injection.

## 2013-06-01 NOTE — Addendum Note (Signed)
Addended by: Doree Albee on: 06/01/2013 10:45 AM   Modules accepted: Orders

## 2013-06-01 NOTE — Progress Notes (Signed)
  Subjective:    CC: Complete physical exam   HPI:  Male hypogonadism: experiencing fatigue, difficulty gaining weight loss less, poor erections, poor sex drive, desires to start parenteral testosterone supplementation.  Carpal tunnel syndrome: Resolved after carpal tunnel hydrodissection at the last visit.  Cerumen impaction: Difficulty hearing from right ear.  Preventative measures: Complete physical will be done, due for flu vaccine.  Hyperlipidemia: Has been trying dietary modification but admits to indiscretions with red meat. Desires to pursue further dietary modification and oral supplements before considering a statin.  Past medical history, Surgical history, Family history not pertinant except as noted below, Social history, Allergies, and medications have been entered into the medical record, reviewed, and no changes needed.   Review of Systems: No headache, visual changes, nausea, vomiting, diarrhea, constipation, dizziness, abdominal pain, skin rash, fevers, chills, night sweats, swollen lymph nodes, weight loss, chest pain, body aches, joint swelling, muscle aches, shortness of breath, mood changes, visual or auditory hallucinations.  Objective:    General: Well Developed, well nourished, and in no acute distress.  Neuro: Alert and oriented x3, extra-ocular muscles intact, sensation grossly intact.  HEENT: Normocephalic, atraumatic, pupils equal round reactive to light, neck supple, no masses, no lymphadenopathy, thyroid nonpalpable. Oropharynx, nasopharynx unremarkable, right external ear canal does have a cerumen impaction. Skin: Warm and dry, no rashes noted.  Cardiac: Regular rate and rhythm, no murmurs rubs or gallops.  Respiratory: Clear to auscultation bilaterally. Not using accessory muscles, speaking in full sentences.  Abdominal: Soft, nontender, nondistended, positive bowel sounds, no masses, no organomegaly.  Musculoskeletal: Shoulder, elbow, wrist, hip, knee,  ankle stable, and with full range of motion.  Indication: Cerumen impaction of the ear(s) Medical necessity statement: On physical examination, cerumen impairs clinically significant portions of the external auditory canal, and tympanic membrane. Noted obstructive, copious cerumen that cannot be removed without magnification and instrumentations requiring physician skills Consent: Discussed benefits and risks of procedure and verbal consent obtained Procedure: Patient was prepped for the procedure. Utilized an otoscope to assess and take note of the ear canal, the tympanic membrane, and the presence, amount, and placement of the cerumen. Gentle water irrigation and soft plastic curette was utilized to remove cerumen.  Post procedure examination: shows cerumen was completely removed. Patient tolerated procedure well. The patient is made aware that they may experience temporary vertigo, temporary hearing loss, and temporary discomfort. If these symptom last for more than 24 hours to call the clinic or proceed to the ED.  Impression and Recommendations:    The patient was counselled, risk factors were discussed, anticipatory guidance given.

## 2013-06-01 NOTE — Assessment & Plan Note (Signed)
Patient is close to goal. And red rice yeast extract.

## 2013-06-01 NOTE — Assessment & Plan Note (Signed)
Removed by physician with curette 

## 2013-06-15 ENCOUNTER — Ambulatory Visit (INDEPENDENT_AMBULATORY_CARE_PROVIDER_SITE_OTHER): Payer: BC Managed Care – PPO | Admitting: Sports Medicine

## 2013-06-15 ENCOUNTER — Encounter: Payer: Self-pay | Admitting: *Deleted

## 2013-06-15 VITALS — BP 136/78 | HR 83 | Wt 218.0 lb

## 2013-06-15 DIAGNOSIS — E291 Testicular hypofunction: Secondary | ICD-10-CM

## 2013-06-15 MED ORDER — TESTOSTERONE CYPIONATE 200 MG/ML IM SOLN
200.0000 mg | INTRAMUSCULAR | Status: DC
  Administered 2013-06-15: 200 mg via INTRAMUSCULAR

## 2013-06-15 NOTE — Progress Notes (Signed)
I was present for all essential parts of this visit and procedure.  Gwen Her. Dianah Field, M.D.

## 2013-06-15 NOTE — Assessment & Plan Note (Signed)
Testosterone injection as above. 

## 2013-06-15 NOTE — Progress Notes (Signed)
Patient was in office for Testosterone injection 200 ml was given left ventroglute area. Brian Spears,CMA

## 2013-06-23 LAB — TESTOSTERONE, FREE, TOTAL, SHBG
Sex Hormone Binding: 28 nmol/L (ref 13–71)
Testosterone, Free: 192.5 pg/mL (ref 47.0–244.0)
Testosterone-% Free: 2.5 % (ref 1.6–2.9)
Testosterone: 775 ng/dL (ref 300–890)

## 2013-06-29 ENCOUNTER — Encounter: Payer: Self-pay | Admitting: *Deleted

## 2013-06-29 ENCOUNTER — Ambulatory Visit (INDEPENDENT_AMBULATORY_CARE_PROVIDER_SITE_OTHER): Payer: BC Managed Care – PPO | Admitting: Sports Medicine

## 2013-06-29 DIAGNOSIS — E291 Testicular hypofunction: Secondary | ICD-10-CM

## 2013-06-29 MED ORDER — TESTOSTERONE CYPIONATE 200 MG/ML IM SOLN
200.0000 mg | INTRAMUSCULAR | Status: DC
  Administered 2013-06-29: 200 mg via INTRAMUSCULAR

## 2013-06-29 NOTE — Progress Notes (Signed)
Patient ID: Brian Spears, male   DOB: [birth year], 36 y.o.   MRN: [id number]    Patient was in the office for Testosterone injection. 200 ml was given right ventrogluteal area. Patient wanted to know if he can start giving himself the injection at home. Najee Manninen,CMA

## 2013-06-29 NOTE — Assessment & Plan Note (Signed)
Testosterone injection as above. 

## 2013-06-30 ENCOUNTER — Telehealth: Payer: Self-pay | Admitting: Sports Medicine

## 2013-06-30 NOTE — Telephone Encounter (Signed)
PT called and stated that he had talk to [facility] about doing his own Testosterone injections, BUT now patient has decided that he does NOT want to do his own and went ahead made a nurse appt for his next testosterone injection. Thanks

## 2013-06-30 NOTE — Telephone Encounter (Signed)
Noted. We will continue testosterone injections q. 2 weeks here in the office.

## 2013-06-30 NOTE — Telephone Encounter (Signed)
Please see attached note. Winfred Iiams,CMA

## 2013-07-13 ENCOUNTER — Encounter: Payer: Self-pay | Admitting: *Deleted

## 2013-07-13 ENCOUNTER — Ambulatory Visit (INDEPENDENT_AMBULATORY_CARE_PROVIDER_SITE_OTHER): Payer: BC Managed Care – PPO | Admitting: Family Medicine

## 2013-07-13 DIAGNOSIS — E291 Testicular hypofunction: Secondary | ICD-10-CM

## 2013-07-13 MED ORDER — TESTOSTERONE CYPIONATE 200 MG/ML IM SOLN
200.0000 mg | INTRAMUSCULAR | Status: DC
  Administered 2013-07-13: 200 mg via INTRAMUSCULAR

## 2013-07-13 NOTE — Progress Notes (Signed)
I was present for all necessary aspects of today's encounter. 

## 2013-07-13 NOTE — Progress Notes (Signed)
Patient was given 200 mg Testosterone injection LUOQ. There was no complications noted from the injection site. Darleen Moffitt,CMA

## 2013-07-27 ENCOUNTER — Encounter: Payer: Self-pay | Admitting: *Deleted

## 2013-07-27 ENCOUNTER — Encounter: Payer: BC Managed Care – PPO | Admitting: *Deleted

## 2013-07-27 ENCOUNTER — Ambulatory Visit (INDEPENDENT_AMBULATORY_CARE_PROVIDER_SITE_OTHER): Payer: BC Managed Care – PPO | Admitting: Sports Medicine

## 2013-07-27 ENCOUNTER — Encounter: Payer: Self-pay | Admitting: Sports Medicine

## 2013-07-27 VITALS — BP 131/84 | HR 87 | Wt 220.0 lb

## 2013-07-27 DIAGNOSIS — E291 Testicular hypofunction: Secondary | ICD-10-CM

## 2013-07-27 DIAGNOSIS — R1013 Epigastric pain: Secondary | ICD-10-CM

## 2013-07-27 LAB — CBC WITH DIFFERENTIAL/PLATELET
Basophils Absolute: 0 10*3/uL (ref 0.0–0.1)
Basophils Relative: 1 % (ref 0–1)
Eosinophils Absolute: 0.3 K/uL (ref 0.0–0.7)
Eosinophils Relative: 4 % (ref 0–5)
HCT: 51.2 % (ref 39.0–52.0)
Hemoglobin: 17.4 g/dL — ABNORMAL HIGH (ref 13.0–17.0)
Lymphocytes Relative: 30 % (ref 12–46)
Lymphs Abs: 2.5 10*3/uL (ref 0.7–4.0)
MCH: 28.7 pg (ref 26.0–34.0)
MCHC: 34 g/dL (ref 30.0–36.0)
MCV: 84.3 fL (ref 78.0–100.0)
Monocytes Absolute: 0.7 10*3/uL (ref 0.1–1.0)
Monocytes Relative: 8 % (ref 3–12)
Neutro Abs: 4.9 10*3/uL (ref 1.7–7.7)
Neutrophils Relative %: 57 % (ref 43–77)
Platelets: 291 K/uL (ref 150–400)
RBC: 6.07 MIL/uL — ABNORMAL HIGH (ref 4.22–5.81)
RDW: 13.9 % (ref 11.5–15.5)
WBC: 8.5 10*3/uL (ref 4.0–10.5)

## 2013-07-27 MED ORDER — TESTOSTERONE CYPIONATE 200 MG/ML IM SOLN
200.0000 mg | INTRAMUSCULAR | Status: DC
  Administered 2013-07-27: 200 mg via INTRAMUSCULAR

## 2013-07-27 MED ORDER — RANITIDINE HCL 300 MG PO TABS: 300.0000 mg | ORAL_TABLET | Freq: Two times a day (BID) | ORAL | Status: DC

## 2013-07-27 NOTE — Progress Notes (Signed)
  Subjective:    CC: Abdominal pain  HPI: He is a pleasant 76 male with a history of Lyme disease who is currently undergoing testosterone supplementation. He does have a history of gastritis and epigastric pain and was seeing a gastroenterologist for mucosal colitis. More recently approximately a month ago he stopped his ranitidine 300 mg twice a day. 2 weeks ago he started to develop epigastric pain, it felt like a gnawing sensation as if he were hungry. He denies any vomiting though he does get mild nausea, no melena, hematochezia, hematemesis. No weight loss. No other constitutional symptoms. Symptoms are improved when he does eat there also improved with probiotics. He has noted some loose stools. Symptoms are mild, persistent.  Past medical history, Surgical history, Family history not pertinant except as noted below, Social history, Allergies, and medications have been entered into the medical record, reviewed, and no changes needed.   Review of Systems: No fevers, chills, night sweats, weight loss, chest pain, or shortness of breath.   Objective:    General: Well Developed, well nourished, and in no acute distress.  Neuro: Alert and oriented x3, extra-ocular muscles intact, sensation grossly intact.  HEENT: Normocephalic, atraumatic, pupils equal round reactive to light, neck supple, no masses, no lymphadenopathy, thyroid nonpalpable.  Skin: Warm and dry, no rashes. Cardiac: Regular rate and rhythm, no murmurs rubs or gallops, no lower extremity edema.  Respiratory: Clear to auscultation bilaterally. Not using accessory muscles, speaking in full sentences. Abdomen: Soft, nontender, nondistended, no palpable masses, no guarding, no rigidity.  Impression and Recommendations:

## 2013-07-27 NOTE — Assessment & Plan Note (Signed)
Persistent, and worsening after coming off of ranitidine. Refilling ranitidine. Checking some blood work including H. Pylori. At some point we need to get him another gastroenterologist.

## 2013-07-28 LAB — COMPREHENSIVE METABOLIC PANEL
AST: 24 U/L (ref 0–37)
Albumin: 4.6 g/dL (ref 3.5–5.2)
Alkaline Phosphatase: 51 U/L (ref 39–117)
BUN: 11 mg/dL (ref 6–23)
CO2: 25 mEq/L (ref 19–32)
Calcium: 9.7 mg/dL (ref 8.4–10.5)
Chloride: 103 mEq/L (ref 96–112)
Potassium: 4.4 mEq/L (ref 3.5–5.3)
Sodium: 137 mEq/L (ref 135–145)
Total Protein: 7.7 g/dL (ref 6.0–8.3)

## 2013-07-28 LAB — LIPASE: Lipase: 15 U/L (ref 0–75)

## 2013-07-28 LAB — COMPREHENSIVE METABOLIC PANEL WITH GFR
ALT: 30 U/L (ref 0–53)
Creat: 0.8 mg/dL (ref 0.50–1.35)
Glucose, Bld: 101 mg/dL — ABNORMAL HIGH (ref 70–99)
Total Bilirubin: 0.5 mg/dL (ref 0.3–1.2)

## 2013-07-29 LAB — TESTOSTERONE, FREE, TOTAL, SHBG
Sex Hormone Binding: 30 nmol/L (ref 13–71)
Testosterone, Free: 110 pg/mL (ref 47.0–244.0)
Testosterone-% Free: 2.2 % (ref 1.6–2.9)
Testosterone: 497 ng/dL (ref 300–890)

## 2013-07-29 LAB — H. PYLORI ANTIBODY, IGG: H Pylori IgG: 0.46 {ISR}

## 2013-08-01 ENCOUNTER — Encounter: Payer: Self-pay | Admitting: Sports Medicine

## 2013-08-01 DIAGNOSIS — D582 Other hemoglobinopathies: Secondary | ICD-10-CM | POA: Insufficient documentation

## 2013-08-09 ENCOUNTER — Ambulatory Visit: Payer: BC Managed Care – PPO | Admitting: *Deleted

## 2013-08-12 NOTE — Progress Notes (Signed)
This encounter was created in error - please disregard.

## 2013-08-12 NOTE — Progress Notes (Signed)
   Subjective:    Patient ID: Brian Spears, male    DOB: [birth year], 37 y.o.   MRN: [id number]  HPI    Review of Systems     Objective:   Physical Exam        Assessment & Plan:  Error, pt canceled appt

## 2013-08-15 ENCOUNTER — Telehealth: Payer: Self-pay | Admitting: Sports Medicine

## 2013-08-15 DIAGNOSIS — E291 Testicular hypofunction: Secondary | ICD-10-CM

## 2013-08-15 NOTE — Telephone Encounter (Signed)
Pt called today because he was trying to give blood Satuday and they informed him that his hemoglobin is really high and he needs to know IF he still should come in for testosterone injection tomorrow.Call patient and let him know

## 2013-08-15 NOTE — Telephone Encounter (Signed)
That can happen with testosterone, he should definitely come in to get it rechecked and we should consider stopping or decreasing Testosteone. Orders placed for CBC.

## 2013-08-16 ENCOUNTER — Encounter: Payer: BC Managed Care – PPO | Admitting: Sports Medicine

## 2013-08-16 LAB — CBC
HCT: 49.2 % (ref 39.0–52.0)
Hemoglobin: 17.1 g/dL — ABNORMAL HIGH (ref 13.0–17.0)
MCH: 29.1 pg (ref 26.0–34.0)
MCHC: 34.8 g/dL (ref 30.0–36.0)
MCV: 83.8 fL (ref 78.0–100.0)
Platelets: 279 10*3/uL (ref 150–400)
RBC: 5.87 MIL/uL — ABNORMAL HIGH (ref 4.22–5.81)
RDW: 13.2 % (ref 11.5–15.5)
WBC: 8.3 10*3/uL (ref 4.0–10.5)

## 2013-08-17 NOTE — Assessment & Plan Note (Signed)
Unfortunately he has developed a common side effect of polycythemia with testosterone supplementation. We will need to space out injections to every month instead of every 2 weeks. Recheck hemoglobin in 2 months. If continues to be elevated we will need to discontinue.

## 2013-08-18 ENCOUNTER — Ambulatory Visit (INDEPENDENT_AMBULATORY_CARE_PROVIDER_SITE_OTHER): Payer: BC Managed Care – PPO | Admitting: Sports Medicine

## 2013-08-18 ENCOUNTER — Ambulatory Visit: Payer: BC Managed Care – PPO

## 2013-08-18 ENCOUNTER — Encounter: Payer: Self-pay | Admitting: Sports Medicine

## 2013-08-18 VITALS — BP 127/76 | HR 72 | Wt 219.0 lb

## 2013-08-18 DIAGNOSIS — D582 Other hemoglobinopathies: Secondary | ICD-10-CM

## 2013-08-18 DIAGNOSIS — E291 Testicular hypofunction: Secondary | ICD-10-CM

## 2013-08-18 DIAGNOSIS — F411 Generalized anxiety disorder: Secondary | ICD-10-CM

## 2013-08-18 DIAGNOSIS — F419 Anxiety disorder, unspecified: Secondary | ICD-10-CM | POA: Insufficient documentation

## 2013-08-18 DIAGNOSIS — R1013 Epigastric pain: Secondary | ICD-10-CM

## 2013-08-18 LAB — CBC
HCT: 51.6 % (ref 39.0–52.0)
Hemoglobin: 17.8 g/dL — ABNORMAL HIGH (ref 13.0–17.0)
MCH: 29.2 pg (ref 26.0–34.0)
MCHC: 34.5 g/dL (ref 30.0–36.0)
MCV: 84.7 fL (ref 78.0–100.0)
Platelets: 285 10*3/uL (ref 150–400)
RBC: 6.09 MIL/uL — ABNORMAL HIGH (ref 4.22–5.81)
RDW: 13.4 % (ref 11.5–15.5)
WBC: 8.6 10*3/uL (ref 4.0–10.5)

## 2013-08-18 MED ORDER — ALPRAZOLAM 0.5 MG PO TABS: 0.5000 mg | ORAL_TABLET | Freq: Two times a day (BID) | ORAL | Status: DC | PRN

## 2013-08-18 NOTE — Assessment & Plan Note (Signed)
Rechecking CBC in 2 months after discontinuation of testosterone.

## 2013-08-18 NOTE — Assessment & Plan Note (Signed)
Symptoms are likely due to irritable bowel. I would like him to look into a couple of medicines, Levsin and Amitiza. We can discuss this further at a future visit.

## 2013-08-18 NOTE — Patient Instructions (Signed)
Medicines we discussed are Levsin and Amitiza, look these up.

## 2013-08-18 NOTE — Assessment & Plan Note (Addendum)
We will see how this improved after discontinuing testosterone. I did advise him happy to call in a short course of Xanax as needed. We are going to proceed with a short course of Xanax.

## 2013-08-18 NOTE — Progress Notes (Signed)
  Subjective:    CC: Followup  HPI: Male hypogonadism: Unfortunately testosterone supplementation is now resulted in polycythemia, he is feeling somewhat anxious about this, and a little bit nauseated. He does desire to stop testosterone supplementation which I think is appropriate.  Past medical history, Surgical history, Family history not pertinant except as noted below, Social history, Allergies, and medications have been entered into the medical record, reviewed, and no changes needed.   Review of Systems: No fevers, chills, night sweats, weight loss, chest pain, or shortness of breath.   Objective:    General: Well Developed, well nourished, and in no acute distress.  Neuro: Alert and oriented x3, extra-ocular muscles intact, sensation grossly intact.  HEENT: Normocephalic, atraumatic, pupils equal round reactive to light, neck supple, no masses, no lymphadenopathy, thyroid nonpalpable.  Skin: Warm and dry, no rashes. Cardiac: Regular rate and rhythm, no murmurs rubs or gallops, no lower extremity edema.  Respiratory: Clear to auscultation bilaterally. Not using accessory muscles, speaking in full sentences.  Impression and Recommendations:   I spent 45 minutes with this patient greater than 50% was face-to-face time counseling regarding the above and below diagnoses.

## 2013-08-18 NOTE — Progress Notes (Signed)
This encounter was created in error - please disregard.

## 2013-08-18 NOTE — Assessment & Plan Note (Signed)
Due to polycythemia we are going to discontinue testosterone supplementation. Like to see him back in 2 months and we can recheck a CBC to ensure normalization.

## 2013-08-29 ENCOUNTER — Ambulatory Visit: Payer: BC Managed Care – PPO | Admitting: *Deleted

## 2013-09-22 ENCOUNTER — Telehealth: Payer: Self-pay

## 2013-09-22 DIAGNOSIS — F419 Anxiety disorder, unspecified: Secondary | ICD-10-CM

## 2013-09-22 MED ORDER — ALPRAZOLAM 0.5 MG PO TABS: 0.5000 mg | ORAL_TABLET | Freq: Two times a day (BID) | ORAL | Status: DC | PRN

## 2013-09-22 NOTE — Telephone Encounter (Signed)
Rx has been faxed to [facility]. Rhonda Cunningham,CMA

## 2013-09-22 NOTE — Telephone Encounter (Signed)
Rx in box. 

## 2013-09-22 NOTE — Telephone Encounter (Signed)
Patient request refill for Alprazolam 0.5 mg sent to [facility]. Rhonda Cunningham,CMA

## 2013-10-19 ENCOUNTER — Telehealth: Payer: Self-pay

## 2013-10-19 ENCOUNTER — Ambulatory Visit: Payer: BC Managed Care – PPO | Admitting: Sports Medicine

## 2013-10-19 DIAGNOSIS — F419 Anxiety disorder, unspecified: Secondary | ICD-10-CM

## 2013-10-19 NOTE — Telephone Encounter (Signed)
Patient called request refill of Xanax. Patient rescheduled appt for next week. Rhonda Cunningham,CMA

## 2013-10-20 MED ORDER — ALPRAZOLAM 0.5 MG PO TABS: 0.5000 mg | ORAL_TABLET | Freq: Two times a day (BID) | ORAL | Status: DC | PRN

## 2013-10-20 NOTE — Telephone Encounter (Signed)
rx in box

## 2013-10-21 NOTE — Telephone Encounter (Signed)
Rx Xanax has been faxed to [facility]. Rhonda Cunningham,CMA

## 2013-10-25 ENCOUNTER — Telehealth: Payer: Self-pay | Admitting: Sports Medicine

## 2013-10-25 DIAGNOSIS — E291 Testicular hypofunction: Secondary | ICD-10-CM

## 2013-10-25 LAB — CBC
HCT: 44.9 % (ref 39.0–52.0)
Hemoglobin: 15.6 g/dL (ref 13.0–17.0)
MCH: 28.1 pg (ref 26.0–34.0)
MCHC: 34.7 g/dL (ref 30.0–36.0)
MCV: 80.9 fL (ref 78.0–100.0)
Platelets: 252 10*3/uL (ref 150–400)
RBC: 5.55 MIL/uL (ref 4.22–5.81)
RDW: 14.3 % (ref 11.5–15.5)
WBC: 8.9 10*3/uL (ref 4.0–10.5)

## 2013-10-25 NOTE — Telephone Encounter (Signed)
Pt called and wants to pick up  lab order today between 4:30 and 4:45, he is not sure what bloodwork he is  getting done. He said he had bloodwork done before but it was too early.    Appt on Thursday.

## 2013-10-27 ENCOUNTER — Ambulatory Visit: Payer: BC Managed Care – PPO | Admitting: Sports Medicine

## 2013-10-28 ENCOUNTER — Ambulatory Visit (INDEPENDENT_AMBULATORY_CARE_PROVIDER_SITE_OTHER): Payer: BC Managed Care – PPO | Admitting: Sports Medicine

## 2013-10-28 ENCOUNTER — Encounter: Payer: Self-pay | Admitting: Sports Medicine

## 2013-10-28 VITALS — BP 126/79 | HR 92 | Ht 68.0 in | Wt 221.0 lb

## 2013-10-28 DIAGNOSIS — B351 Tinea unguium: Secondary | ICD-10-CM

## 2013-10-28 DIAGNOSIS — G5600 Carpal tunnel syndrome, unspecified upper limb: Secondary | ICD-10-CM

## 2013-10-28 MED ORDER — TERBINAFINE HCL 250 MG PO TABS: 250.0000 mg | ORAL_TABLET | Freq: Every day | ORAL | Status: AC

## 2013-10-28 NOTE — Progress Notes (Signed)
  Subjective:    CC: Follow up  HPI: Carpal tunnel syndrome: At least 4 months status post left median nerve hydrodissection, symptoms are recurrent, he desires a repeat. Pain is moderate, persistent.  Toenail lesion: Patient does think that this represents a fungal infection, he does have an existing diagnosis of subungual hematoma. He does desire treatment with antifungals.  Past medical history, Surgical history, Family history not pertinant except as noted below, Social history, Allergies, and medications have been entered into the medical record, reviewed, and no changes needed.   Review of Systems: No fevers, chills, night sweats, weight loss, chest pain, or shortness of breath.   Objective:    General: Well Developed, well nourished, and in no acute distress.  Neuro: Alert and oriented x3, extra-ocular muscles intact, sensation grossly intact.  HEENT: Normocephalic, atraumatic, pupils equal round reactive to light, neck supple, no masses, no lymphadenopathy, thyroid nonpalpable.  Skin: Warm and dry, no rashes. Cardiac: Regular rate and rhythm, no murmurs rubs or gallops, no lower extremity edema.  Respiratory: Clear to auscultation bilaterally. Not using accessory muscles, speaking in full sentences.  Procedure: Real-time Ultrasound Guided hydrodissection of left median nerve Device: GE Logiq E  Verbal informed consent obtained.  Time-out conducted.  Noted no overlying erythema, induration, or other signs of local infection.  Skin prepped in a sterile fashion.  Local anesthesia: Topical Ethyl chloride.  With sterile technique and under real time ultrasound guidance: Needle advanced and total of 1 cc Kenalog 40, 4 cc lidocaine injected both superficial tube, and deep to the median nerve as it was peeled away from surrounding structures.  Completed without difficulty  Pain immediately resolved suggesting accurate placement of the medication.  Advised to call if fevers/chills,  erythema, induration, drainage, or persistent bleeding.  Images permanently stored and available for review in the ultrasound unit.  Impression: Technically successful ultrasound guided injection.  Impression and Recommendations:

## 2013-10-28 NOTE — Assessment & Plan Note (Signed)
3-6 months of Lamisil.

## 2013-10-28 NOTE — Assessment & Plan Note (Signed)
Left median nerve hydrodissection as above. 

## 2013-11-02 ENCOUNTER — Encounter: Payer: Self-pay | Admitting: Sports Medicine

## 2013-11-17 ENCOUNTER — Other Ambulatory Visit: Payer: Self-pay | Admitting: Sports Medicine

## 2013-11-18 ENCOUNTER — Other Ambulatory Visit: Payer: Self-pay | Admitting: *Deleted

## 2013-11-18 ENCOUNTER — Other Ambulatory Visit: Payer: Self-pay

## 2013-11-18 DIAGNOSIS — F419 Anxiety disorder, unspecified: Secondary | ICD-10-CM

## 2013-11-18 MED ORDER — ALPRAZOLAM 0.5 MG PO TABS: 0.5000 mg | ORAL_TABLET | Freq: Two times a day (BID) | ORAL | Status: DC | PRN

## 2013-11-30 ENCOUNTER — Encounter: Payer: Self-pay | Admitting: Sports Medicine

## 2013-11-30 ENCOUNTER — Ambulatory Visit (INDEPENDENT_AMBULATORY_CARE_PROVIDER_SITE_OTHER): Payer: BC Managed Care – PPO | Admitting: Sports Medicine

## 2013-11-30 VITALS — BP 122/73 | HR 83 | Ht 68.0 in | Wt 218.0 lb

## 2013-11-30 DIAGNOSIS — E785 Hyperlipidemia, unspecified: Secondary | ICD-10-CM

## 2013-11-30 DIAGNOSIS — F419 Anxiety disorder, unspecified: Secondary | ICD-10-CM

## 2013-11-30 DIAGNOSIS — B351 Tinea unguium: Secondary | ICD-10-CM

## 2013-11-30 DIAGNOSIS — F411 Generalized anxiety disorder: Secondary | ICD-10-CM

## 2013-11-30 MED ORDER — ALPRAZOLAM 0.5 MG PO TABS: 0.5000 mg | ORAL_TABLET | Freq: Two times a day (BID) | ORAL | Status: DC | PRN

## 2013-11-30 NOTE — Assessment & Plan Note (Addendum)
Rechecking lipids Continued be elevated, adding high-dose Lipitor.

## 2013-11-30 NOTE — Assessment & Plan Note (Signed)
Unfortunately lost his job, he does have a job interview today. He does need a refill of alprazolam.

## 2013-11-30 NOTE — Progress Notes (Signed)
  Subjective:    CC: Recheck  HPI: Onychomycosis: New toenail has started to grow out after 3 months of Lamisil.  Hyperlipidemia: Lipids were moderately elevated at the last check 6 months ago, he is doing a low cholesterol diet and red yeast rice extract. Is not fasting today but due for a recheck.  Anxiety: Unfortunately recently lost his job, due to this he used up his supply of alprazolam. He has never requested early refills in the past, and does have a job interview today.  Past medical history, Surgical history, Family history not pertinant except as noted below, Social history, Allergies, and medications have been entered into the medical record, reviewed, and no changes needed.   Review of Systems: No fevers, chills, night sweats, weight loss, chest pain, or shortness of breath.   Objective:    General: Well Developed, well nourished, and in no acute distress.  Neuro: Alert and oriented x3, extra-ocular muscles intact, sensation grossly intact.  HEENT: Normocephalic, atraumatic, pupils equal round reactive to light, neck supple, no masses, no lymphadenopathy, thyroid nonpalpable.  Skin: Warm and dry, no rashes. New toenail appears to be growing out from the proximal fold. Cardiac: Regular rate and rhythm, no murmurs rubs or gallops, no lower extremity edema.  Respiratory: Clear to auscultation bilaterally. Not using accessory muscles, speaking in full sentences.  Impression and Recommendations:

## 2013-11-30 NOTE — Assessment & Plan Note (Signed)
Resolved with 3 months of Lamisil.

## 2013-12-01 LAB — LIPID PANEL
Cholesterol: 248 mg/dL — ABNORMAL HIGH (ref 0–200)
HDL: 51 mg/dL (ref >39)
LDL Cholesterol: 172 mg/dL — ABNORMAL HIGH (ref 0–99)
Total CHOL/HDL Ratio: 4.9 ratio
Triglycerides: 126 mg/dL (ref <150)
VLDL: 25 mg/dL (ref 0–40)

## 2013-12-01 LAB — CBC
HCT: 45.7 % (ref 39.0–52.0)
Hemoglobin: 15.9 g/dL (ref 13.0–17.0)
MCH: 28.6 pg (ref 26.0–34.0)
MCHC: 34.8 g/dL (ref 30.0–36.0)
MCV: 82.3 fL (ref 78.0–100.0)
Platelets: 266 K/uL (ref 150–400)
RBC: 5.55 MIL/uL (ref 4.22–5.81)
RDW: 14.3 % (ref 11.5–15.5)
WBC: 8.2 K/uL (ref 4.0–10.5)

## 2013-12-01 LAB — COMPREHENSIVE METABOLIC PANEL WITH GFR
ALT: 37 U/L (ref 0–53)
CO2: 27 meq/L (ref 19–32)
Creat: 0.83 mg/dL (ref 0.50–1.35)

## 2013-12-01 LAB — COMPREHENSIVE METABOLIC PANEL
AST: 22 U/L (ref 0–37)
Albumin: 4.5 g/dL (ref 3.5–5.2)
Alkaline Phosphatase: 61 U/L (ref 39–117)
BUN: 14 mg/dL (ref 6–23)
Calcium: 10.3 mg/dL (ref 8.4–10.5)
Chloride: 101 mEq/L (ref 96–112)
Glucose, Bld: 92 mg/dL (ref 70–99)
Potassium: 4.9 mEq/L (ref 3.5–5.3)
Sodium: 137 mEq/L (ref 135–145)
Total Bilirubin: 0.7 mg/dL (ref 0.2–1.2)
Total Protein: 7.6 g/dL (ref 6.0–8.3)

## 2013-12-05 MED ORDER — ATORVASTATIN CALCIUM 40 MG PO TABS: 40.0000 mg | ORAL_TABLET | Freq: Every day | ORAL | Status: DC

## 2013-12-05 NOTE — Addendum Note (Signed)
Addended by: Silverio Decamp on: 12/05/2013 06:24 PM   Modules accepted: Orders

## 2013-12-30 ENCOUNTER — Telehealth: Payer: Self-pay

## 2013-12-30 ENCOUNTER — Other Ambulatory Visit: Payer: Self-pay

## 2013-12-30 DIAGNOSIS — F419 Anxiety disorder, unspecified: Secondary | ICD-10-CM

## 2013-12-30 MED ORDER — ALPRAZOLAM 0.5 MG PO TABS: 0.5000 mg | ORAL_TABLET | Freq: Two times a day (BID) | ORAL | Status: DC | PRN

## 2013-12-30 NOTE — Telephone Encounter (Signed)
Patient request refill for Xanax 0.5 mg. Rhonda Cunningham,CMA

## 2013-12-30 NOTE — Telephone Encounter (Signed)
Rx in box. 

## 2014-01-31 ENCOUNTER — Other Ambulatory Visit: Payer: Self-pay

## 2014-01-31 ENCOUNTER — Other Ambulatory Visit: Payer: Self-pay | Admitting: Sports Medicine

## 2014-01-31 DIAGNOSIS — F419 Anxiety disorder, unspecified: Secondary | ICD-10-CM

## 2014-01-31 MED ORDER — ALPRAZOLAM 0.5 MG PO TABS: 0.5000 mg | ORAL_TABLET | Freq: Two times a day (BID) | ORAL | Status: DC | PRN

## 2014-01-31 NOTE — Telephone Encounter (Signed)
Patient request refill for Xanax. Rhonda Cunningham,CMA

## 2014-02-15 ENCOUNTER — Encounter: Payer: Self-pay | Admitting: Sports Medicine

## 2014-02-26 ENCOUNTER — Other Ambulatory Visit: Payer: Self-pay | Admitting: Sports Medicine

## 2014-02-26 ENCOUNTER — Encounter: Payer: Self-pay | Admitting: Sports Medicine

## 2014-02-27 ENCOUNTER — Other Ambulatory Visit: Payer: Self-pay | Admitting: Sports Medicine

## 2014-02-27 DIAGNOSIS — F419 Anxiety disorder, unspecified: Secondary | ICD-10-CM

## 2014-02-27 MED ORDER — ALPRAZOLAM 0.5 MG PO TABS: 0.5000 mg | ORAL_TABLET | Freq: Two times a day (BID) | ORAL | Status: DC | PRN

## 2014-02-27 NOTE — Telephone Encounter (Signed)
Come in to have [place] check your hemoglobin A1c.  If its normal, nothing to worry about.

## 2014-03-21 ENCOUNTER — Ambulatory Visit (INDEPENDENT_AMBULATORY_CARE_PROVIDER_SITE_OTHER): Payer: BC Managed Care – PPO | Admitting: Physician Assistant

## 2014-03-21 ENCOUNTER — Encounter: Payer: Self-pay | Admitting: Physician Assistant

## 2014-03-21 VITALS — BP 140/76 | HR 84 | Temp 97.9°F | Ht 68.0 in | Wt 224.0 lb

## 2014-03-21 DIAGNOSIS — J069 Acute upper respiratory infection, unspecified: Secondary | ICD-10-CM

## 2014-03-21 DIAGNOSIS — R0989 Other specified symptoms and signs involving the circulatory and respiratory systems: Secondary | ICD-10-CM

## 2014-03-21 DIAGNOSIS — J989 Respiratory disorder, unspecified: Secondary | ICD-10-CM

## 2014-03-21 DIAGNOSIS — R062 Wheezing: Secondary | ICD-10-CM

## 2014-03-21 MED ORDER — IPRATROPIUM-ALBUTEROL 0.5-2.5 (3) MG/3ML IN SOLN
3.0000 mL | Freq: Once | RESPIRATORY_TRACT | Status: AC
  Administered 2014-03-21: 3 mL via RESPIRATORY_TRACT

## 2014-03-21 MED ORDER — ALBUTEROL SULFATE HFA 108 (90 BASE) MCG/ACT IN AERS
1.0000 | INHALATION_SPRAY | RESPIRATORY_TRACT | Status: DC | PRN
Start: 2014-03-21 — End: 2014-04-01

## 2014-03-21 MED ORDER — AZITHROMYCIN 250 MG PO TABS: ORAL_TABLET | ORAL | Status: DC

## 2014-03-21 NOTE — Progress Notes (Signed)
   Subjective:    Patient ID: Brian Spears, male    DOB: 1976-06-23, 38 y.o.   MRN: [id number]  HPI Pt is a 38 yo male who presents to the clinic with ST, headache, right ear pain, cough, wheezing for one day. He began to feel tired yesterday and woke up last night acutely with these symptoms. Taking ibuprofen which seems to help some. No fever or chills.  Cough is somewhat productive. Wife has been sick since last week and still struggling to fight it off.     Review of Systems  All other systems reviewed and are negative.      Objective:   Physical Exam  Constitutional: He is oriented to person, place, and time. He appears well-developed and well-nourished.  HENT:  Head: Normocephalic and atraumatic.  Right Ear: External ear normal.  Left Ear: External ear normal.  Nose: Nose normal.  Mouth/Throat: Oropharynx is clear and moist. No oropharyngeal exudate.  Eyes: Conjunctivae are normal. Right eye exhibits no discharge. Left eye exhibits no discharge.  Neck: Normal range of motion. Neck supple.  Bilateral anterior cervical adenopathy, non tender.   Cardiovascular: Normal rate, regular rhythm and normal heart sounds.   Pulmonary/Chest: Effort normal and breath sounds normal.  Significant bilateral lung wheezing on exam today.   Neurological: He is alert and oriented to person, place, and time.  Skin: Skin is dry.  Psychiatric: He has a normal mood and affect. His behavior is normal.          Assessment & Plan:  URI/Wheezing/reactive airway disease- patient was given handout for upper respiratory infection. It was discussed with patient that this is likely a viral etiology. Encouraged patient to use albuterol every 4-6 hours as needed for shortness of breath and wheezing as well as Mucinex D 1-2 tablets twice a day for congestion. Handout was given for other symptomatic care. If not improving or feels like worsening in the next 3 days I did give a prescription for azithromycin to  start. Encouraged patient not to start her legs 3 days. I did offer patient a course of prednisone to help with wheezing. Pt declined and stated "prednisone makes him worse". I also encouraged him to take flonase for ear pain, he also declined that stating "nasal sprays make him feel wiped out". Discussed reflux with patient and aware to call office or during care with any worsening of shortness of breath.

## 2014-03-21 NOTE — Patient Instructions (Addendum)
Albuterol inhaler every 4-6 hours 2 puffs.  Mucinex DM take 1 tablet for twice a day.    Upper Respiratory Infection, Adult An upper respiratory infection (URI) is also known as the common cold. It is often caused by a type of germ (virus). Colds are easily spread (contagious). You can pass it to others by kissing, coughing, sneezing, or drinking out of the same glass. Usually, you get better in 1 or 2 weeks.  HOME CARE   Only take medicine as told by your doctor.  Use a warm mist humidifier or breathe in steam from a hot shower.  Drink enough water and fluids to keep your pee (urine) clear or pale yellow.  Get plenty of rest.  Return to work when your temperature is back to normal or as told by your doctor. You may use a face mask and wash your hands to stop your cold from spreading. GET HELP RIGHT AWAY IF:   After the first few days, you feel you are getting worse.  You have questions about your medicine.  You have chills, shortness of breath, or brown or red spit (mucus).  You have yellow or brown snot (nasal discharge) or pain in the face, especially when you bend forward.  You have a fever, puffy (swollen) neck, pain when you swallow, or white spots in the back of your throat.  You have a bad headache, ear pain, sinus pain, or chest pain.  You have a high-pitched whistling sound when you breathe in and out (wheezing).  You have a lasting cough or cough up blood.  You have sore muscles or a stiff neck. MAKE SURE YOU:   Understand these instructions.  Will watch your condition.  Will get help right away if you are not doing well or get worse. Document Released: 02/04/2008 Document Revised: 11/10/2011 Document Reviewed: 12/23/2010 [facility] Patient Information 2015 [place], [place]. This information is not intended to replace advice given to you by your health care provider. Make sure you discuss any questions you have with your health care provider.

## 2014-03-23 ENCOUNTER — Encounter: Payer: Self-pay | Admitting: Sports Medicine

## 2014-03-28 ENCOUNTER — Ambulatory Visit (INDEPENDENT_AMBULATORY_CARE_PROVIDER_SITE_OTHER): Payer: BC Managed Care – PPO

## 2014-03-28 ENCOUNTER — Ambulatory Visit: Payer: BC Managed Care – PPO | Admitting: Family Medicine

## 2014-03-28 ENCOUNTER — Telehealth: Payer: Self-pay

## 2014-03-28 ENCOUNTER — Encounter: Payer: Self-pay | Admitting: Sports Medicine

## 2014-03-28 DIAGNOSIS — R059 Cough, unspecified: Secondary | ICD-10-CM

## 2014-03-28 DIAGNOSIS — R05 Cough: Secondary | ICD-10-CM

## 2014-03-28 NOTE — Telephone Encounter (Signed)
[name] called and stated that he saw Brian Spears last Tuesday and he still has a lot of chest congestion, body aches, sweats and some diarrhea, heart pounding and eyes feel hot. Pt did state that he is taking 800 mg of Ibuprofen./Theta Leaf,CMA

## 2014-03-28 NOTE — Telephone Encounter (Signed)
Sounds like a viral illness, lets get a CXR to ensure no PNA.

## 2014-03-28 NOTE — Telephone Encounter (Signed)
Pt informed with understanding and he stated that he is going to head out now and have it done./[name],CMA

## 2014-03-29 ENCOUNTER — Telehealth: Payer: Self-pay

## 2014-03-29 DIAGNOSIS — F419 Anxiety disorder, unspecified: Secondary | ICD-10-CM

## 2014-03-29 MED ORDER — ALPRAZOLAM 0.5 MG PO TABS: 0.5000 mg | ORAL_TABLET | Freq: Two times a day (BID) | ORAL | Status: DC | PRN

## 2014-03-29 NOTE — Telephone Encounter (Signed)
Patient request a refill on Xanax. Brian Spears,CMA

## 2014-03-29 NOTE — Telephone Encounter (Signed)
Rx in box. 

## 2014-03-31 ENCOUNTER — Encounter: Payer: Self-pay | Admitting: Sports Medicine

## 2014-04-01 ENCOUNTER — Encounter: Payer: Self-pay | Admitting: Emergency Medicine

## 2014-04-01 ENCOUNTER — Emergency Department (INDEPENDENT_AMBULATORY_CARE_PROVIDER_SITE_OTHER)
Admission: EM | Admit: 2014-04-01 | Discharge: 2014-04-01 | Disposition: A | Discharge status: Home / Self Care | Payer: BC Managed Care – PPO | Source: Home / Self Care

## 2014-04-01 DIAGNOSIS — J9801 Acute bronchospasm: Secondary | ICD-10-CM

## 2014-04-01 HISTORY — DX: Lyme disease, unspecified: A69.20

## 2014-04-01 MED ORDER — DOXYCYCLINE HYCLATE 100 MG PO CAPS: 100.0000 mg | ORAL_CAPSULE | Freq: Two times a day (BID) | ORAL | Status: DC

## 2014-04-01 MED ORDER — BENZONATATE 200 MG PO CAPS: 200.0000 mg | ORAL_CAPSULE | Freq: Every day | ORAL | Status: DC

## 2014-04-01 MED ORDER — FLUTICASONE-SALMETEROL 100-50 MCG/DOSE IN AEPB: 1.0000 | INHALATION_SPRAY | Freq: Two times a day (BID) | RESPIRATORY_TRACT | Status: DC

## 2014-04-01 MED ORDER — ALBUTEROL SULFATE HFA 108 (90 BASE) MCG/ACT IN AERS: 2.0000 | INHALATION_SPRAY | RESPIRATORY_TRACT | Status: DC | PRN

## 2014-04-01 MED ORDER — IPRATROPIUM-ALBUTEROL 0.5-2.5 (3) MG/3ML IN SOLN: 3.0000 mL | RESPIRATORY_TRACT | Status: DC

## 2014-04-01 NOTE — ED Notes (Signed)
Pt seen last week by Dr. Darene Lamer and rec'd chest xrays.  Cough began last noc worsening with feelings of SOB and wheezing.  Used Albuterol inhaler q4h from evening until 5 AM.  None since.

## 2014-04-01 NOTE — Discharge Instructions (Signed)
Take plain Mucinex (1200 mg guaifenesin) twice daily for cough and congestion.  Increase fluid intake, rest. Continue albuterol inhaler as needed. Stop all antihistamines for now, and other non-prescription cough/cold preparations. Follow-up with family doctor if not improving 7 to 10 days.    Bronchospasm A bronchospasm is a spasm or tightening of the airways going into the lungs. During a bronchospasm breathing becomes more difficult because the airways get smaller. When this happens there can be coughing, a whistling sound when breathing (wheezing), and difficulty breathing. Bronchospasm is often associated with asthma, but not all patients who experience a bronchospasm have asthma. CAUSES  A bronchospasm is caused by inflammation or irritation of the airways. The inflammation or irritation may be triggered by:   Allergies (such as to animals, pollen, food, or mold). Allergens that cause bronchospasm may cause wheezing immediately after exposure or many hours later.   Infection. Viral infections are believed to be the most common cause of bronchospasm.   Exercise.   Irritants (such as pollution, cigarette smoke, strong odors, aerosol sprays, and paint fumes).   Weather changes. Winds increase molds and pollens in the air. Rain refreshes the air by washing irritants out. Cold air may cause inflammation.   Stress and emotional upset.  SIGNS AND SYMPTOMS   Wheezing.   Excessive nighttime coughing.   Frequent or severe coughing with a simple cold.   Chest tightness.   Shortness of breath.  DIAGNOSIS  Bronchospasm is usually diagnosed through a history and physical exam. Tests, such as chest X-rays, are sometimes done to look for other conditions. TREATMENT   Inhaled medicines can be given to open up your airways and help you breathe. The medicines can be given using either an inhaler or a nebulizer machine.  Corticosteroid medicines may be given for severe  bronchospasm, usually when it is associated with asthma. HOME CARE INSTRUCTIONS   Always have a plan prepared for seeking medical care. Know when to call your health care provider and local emergency services (911 in the [place] Know where you can access local emergency care.  Only take medicines as directed by your health care provider.  If you were prescribed an inhaler or nebulizer machine, ask your health care provider to explain how to use it correctly. Always use a spacer with your inhaler if you were given one.  It is necessary to remain calm during an attack. Try to relax and breathe more slowly.  Control your home environment in the following ways:   Change your heating and air conditioning filter at least once a month.   Limit your use of fireplaces and wood stoves.  Do not smoke and do not allow smoking in your home.   Avoid exposure to perfumes and fragrances.   Get rid of pests (such as roaches and mice) and their droppings.   Throw away plants if you see mold on them.   Keep your house clean and dust free.   Replace carpet with wood, tile, or vinyl flooring. Carpet can trap dander and dust.   Use allergy-proof pillows, mattress covers, and box spring covers.   Wash bed sheets and blankets every week in hot water and dry them in a dryer.   Use blankets that are made of polyester or cotton.   Wash hands frequently. SEEK MEDICAL CARE IF:   You have muscle aches.   You have chest pain.   The sputum changes from clear or white to yellow, green, gray, or bloody.  The sputum you cough up gets thicker.   There are problems that may be related to the medicine you are given, such as a rash, itching, swelling, or trouble breathing.  SEEK IMMEDIATE MEDICAL CARE IF:   You have worsening wheezing and coughing even after taking your prescribed medicines.   You have increased difficulty breathing.   You develop severe chest pain. MAKE SURE YOU:     Understand these instructions.  Will watch your condition.  Will get help right away if you are not doing well or get worse. Document Released: 08/21/2003 Document Revised: 08/23/2013 Document Reviewed: 02/07/2013 [facility] Patient Information 2015 [place], [place]. This information is not intended to replace advice given to you by your health care provider. Make sure you discuss any questions you have with your health care provider.

## 2014-04-01 NOTE — ED Provider Notes (Signed)
CSN: [id number]     Arrival date & time 04/01/14  0904 History   None    Chief Complaint  Patient presents with  . Chest Pain    Tightness with cough     HPI Comments: Patient has had an upper respiratory illness over the past 10 days.  A chest X-ray performed four days ago was negative for active cardiopulmonary disease.  He finished a Z-pack two days ago, and reports that his fever resolved at that time with no recurrence.  He complains of increased non-productive cough yesterday, with wheezing and shortness of breath, but no pleuritic pain.  He has minimal nasal congestion at this time.  He notes that his albuterol MDI provides some relief.  He has been taking an antihistamine product at bedtime.  The history is provided by the patient and the spouse.    Past Medical History  Diagnosis Date  . Hand fracture   . Normal cardiac stress test 4/10    at [facility] - negative.  terminated for fatigue  . Hyperlipidemia   . Lyme disease    Past Surgical History  Procedure Laterality Date  . Surgery on right hand     Family History  Problem Relation Age of Onset  . Breast cancer Mother   . Hyperlipidemia Mother   . Hypertension Father   . Hyperlipidemia Father   . Other Other     grandfather with alcoholism  . Heart attack Other     grandmother  . Diabetes Other     grandmother  . Colon cancer Other     uncle  . Breast cancer Other     aunt  . Lung cancer Other     grandmother  . Prostate cancer Other     grandfather  . Melanoma Other     uncle   History  Substance Use Topics  . Smoking status: Former Smoker -- 0.20 packs/day    Types: Cigarettes  . Smokeless tobacco: Former Systems developer    Types: Snuff    Quit date: 06/02/2011     Comment: started smoking at age 48, started smokeless tobacco at age 89  . Alcohol Use: No    Review of Systems + sore throat, resolved + cough No pleuritic pain + wheezing + nasal congestion + post-nasal drainage ? sinus pain/pressure No  itchy/red eyes ? earache No hemoptysis + SOB No fever/chills No nausea No vomiting No abdominal pain No diarrhea No urinary symptoms No skin rash + fatigue + myalgias + headache Used OTC meds without relief  Allergies  Sulfa antibiotics and Naproxen sodium  Home Medications   Prior to Admission medications   Medication Sig Start Date End Date Taking? Authorizing Provider  albuterol (PROVENTIL HFA;VENTOLIN HFA) 108 (90 BASE) MCG/ACT inhaler Inhale 2 puffs into the lungs every 4 (four) hours as needed for wheezing or shortness of breath. 04/01/14   Kandra Nicolas, MD  ALPRAZolam Duanne Moron) 0.5 MG tablet Take 1 tablet (0.5 mg total) by mouth 2 (two) times daily as needed for anxiety. 03/29/14   Silverio Decamp, MD  atorvastatin (LIPITOR) 40 MG tablet Take 1 tablet (40 mg total) by mouth daily. 12/05/13   Silverio Decamp, MD  azithromycin (ZITHROMAX) 250 MG tablet Take 2 tablets now and then one tablet for 4 days. 03/21/14   Jade L Breeback, PA-C  benzonatate (TESSALON) 200 MG capsule Take 1 capsule (200 mg total) by mouth at bedtime. Take as needed for cough 04/01/14   Annie Main  A Beese, MD  doxycycline (VIBRAMYCIN) 100 MG capsule Take 1 capsule (100 mg total) by mouth 2 (two) times daily. (Rx void after 04/08/14) 04/01/14   Kandra Nicolas, MD  Fluticasone-Salmeterol (ADVAIR DISKUS) 100-50 MCG/DOSE AEPB Inhale 1 puff into the lungs 2 (two) times daily. 04/01/14   Kandra Nicolas, MD  Probiotic Product (PROBIOTIC FORMULA PO) Take 2 tablets by mouth 2 (two) times daily.     Historical Provider, MD   BP 118/80  Pulse 98  Temp(Src) 98.2 F (36.8 C) (Oral)  Resp 20  Ht 5' 8"  (1.727 m)  Wt 220 lb 12 oz (100.132 kg)  BMI 33.57 kg/m2  SpO2 98% Physical Exam Nursing notes and Vital Signs reviewed. Appearance:  Patient appears stated age, and in no acute distress.  Patient is obese (BMI 33.6) Eyes:  Pupils are equal, round, and reactive to light and accomodation.  Extraocular movement is  intact.  Conjunctivae are not inflamed  Ears:  Canals normal.  Tympanic membranes normal.  Nose:  Mildly congested turbinates.  No sinus tenderness.   Pharynx:  Normal Neck:  Supple.  Tender enlarged posterior nodes are palpated bilaterally  Lungs:   Faint bilateral wheezes.  Breath sounds are equal.  Chest:  Distinct tenderness to palpation over the mid-sternum.  Heart:  Regular rate and rhythm without murmurs, rubs, or gallops.  Abdomen:  Nontender without masses or hepatosplenomegaly.  Bowel sounds are present.  No CVA or flank tenderness.  Extremities:  No edema.  No calf tenderness Skin:  No rash present.   ED Course  Procedures  none    Labs Reviewed  CBC WITH DIFFERENTIAL  Attempted to obtain blood specimen without success   MDM   1. Bronchospasm; suspect resolving viral URI with post-infectious cough    Administered DuoNeb by nebulizer.  Patient reports subjective improvement. Patient declines Rx for prednisone; will begin Advair.   Take plain Mucinex (1200 mg guaifenesin) twice daily for cough and congestion.  Increase fluid intake, rest. Continue albuterol inhaler as needed. Stop all antihistamines for now, and other non-prescription cough/cold preparations. Patient has a follow-up with PCP in 3 days.     Kandra Nicolas, MD 04/01/14 [phone]

## 2014-04-04 ENCOUNTER — Telehealth: Payer: Self-pay

## 2014-04-04 ENCOUNTER — Ambulatory Visit (INDEPENDENT_AMBULATORY_CARE_PROVIDER_SITE_OTHER): Payer: BC Managed Care – PPO | Admitting: Sports Medicine

## 2014-04-04 ENCOUNTER — Encounter: Payer: Self-pay | Admitting: Sports Medicine

## 2014-04-04 ENCOUNTER — Ambulatory Visit (INDEPENDENT_AMBULATORY_CARE_PROVIDER_SITE_OTHER): Payer: BC Managed Care – PPO

## 2014-04-04 VITALS — BP 128/82 | HR 93 | Ht 68.0 in | Wt 221.0 lb

## 2014-04-04 DIAGNOSIS — R05 Cough: Secondary | ICD-10-CM

## 2014-04-04 DIAGNOSIS — G5602 Carpal tunnel syndrome, left upper limb: Secondary | ICD-10-CM

## 2014-04-04 DIAGNOSIS — G5600 Carpal tunnel syndrome, unspecified upper limb: Secondary | ICD-10-CM

## 2014-04-04 DIAGNOSIS — R062 Wheezing: Secondary | ICD-10-CM | POA: Insufficient documentation

## 2014-04-04 DIAGNOSIS — R059 Cough, unspecified: Secondary | ICD-10-CM

## 2014-04-04 MED ORDER — PREDNISONE (PAK) 10 MG PO TABS: ORAL_TABLET | ORAL | Status: DC

## 2014-04-04 NOTE — Progress Notes (Addendum)
  Subjective:    CC: Multiple issues  HPI: Carpal tunnel syndrome: Left sided, last injection was in February, desires repeat injection.  Cough: Recently seen in urgent care, given Advair and albuterol, he already finished a course of azithromycin, persistent cough.  Past medical history, Surgical history, Family history not pertinant except as noted below, Social history, Allergies, and medications have been entered into the medical record, reviewed, and no changes needed.   Review of Systems: No fevers, chills, night sweats, weight loss, chest pain, or shortness of breath.   Objective:    General: Well Developed, well nourished, and in no acute distress.  Neuro: Alert and oriented x3, extra-ocular muscles intact, sensation grossly intact.  HEENT: Normocephalic, atraumatic, pupils equal round reactive to light, neck supple, no masses, no lymphadenopathy, thyroid nonpalpable.  Skin: Warm and dry, no rashes. Cardiac: Regular rate and rhythm, no murmurs rubs or gallops, no lower extremity edema.  Respiratory: Coarse expiratory wheezes.. Not using accessory muscles, speaking in full sentences.  Procedure: Real-time Ultrasound Guided Injection of left carpal tunnel/median nerve hydrodissection Device: GE Logiq E  Verbal informed consent obtained.  Time-out conducted.  Noted no overlying erythema, induration, or other signs of local infection.  Skin prepped in a sterile fashion.  Local anesthesia: Topical Ethyl chloride.  With sterile technique and under real time ultrasound guidance:  25-gauge needle advanced into the carpal tunnel,1 cc Kenalog 40, 4 cc lidocaine injected both superficial to and deep to the median nerve Completed without difficulty  Pain immediately resolved suggesting accurate placement of the medication.  Advised to call if fevers/chills, erythema, induration, drainage, or persistent bleeding.  Images permanently stored and available for review in the ultrasound unit.    Impression: Technically successful ultrasound guided injection.  Chest x-ray continues to be clear.  Impression and Recommendations:

## 2014-04-04 NOTE — Assessment & Plan Note (Signed)
Prednisone, repeat chest x-ray. Continue Advair and albuterol. Needs to take doxycycline prescribed in urgent care. Return if no better in 2 weeks.

## 2014-04-04 NOTE — ED Notes (Signed)
I called and spoke with patient and he is doing better. I advised to call back if anything changes or if he has questions or concerns. Advised patient to keep his appointment with Dr Dianah Field.

## 2014-04-04 NOTE — Assessment & Plan Note (Signed)
Six-month response to the last left-sided median nerve hydrodissection. Repeat carpal tunnel injection/median nerve hydrodissection today. Return as needed for this.

## 2014-04-13 ENCOUNTER — Encounter: Payer: Self-pay | Admitting: Sports Medicine

## 2014-04-25 ENCOUNTER — Other Ambulatory Visit: Payer: Self-pay | Admitting: Sports Medicine

## 2014-04-25 DIAGNOSIS — F419 Anxiety disorder, unspecified: Secondary | ICD-10-CM

## 2014-04-25 MED ORDER — ALPRAZOLAM 0.5 MG PO TABS: 0.5000 mg | ORAL_TABLET | Freq: Two times a day (BID) | ORAL | Status: DC | PRN

## 2014-05-02 ENCOUNTER — Encounter: Payer: Self-pay | Admitting: Sports Medicine

## 2014-05-02 NOTE — Telephone Encounter (Signed)
Left message on patient's phone to call back and schedule an appointment to follow up on symptoms.

## 2014-05-04 ENCOUNTER — Encounter: Payer: Self-pay | Admitting: Sports Medicine

## 2014-05-04 ENCOUNTER — Ambulatory Visit (INDEPENDENT_AMBULATORY_CARE_PROVIDER_SITE_OTHER): Payer: BC Managed Care – PPO | Admitting: Sports Medicine

## 2014-05-04 VITALS — BP 122/63 | HR 88 | Temp 98.2°F | Ht 68.0 in | Wt 216.0 lb

## 2014-05-04 DIAGNOSIS — R0982 Postnasal drip: Secondary | ICD-10-CM

## 2014-05-04 DIAGNOSIS — K900 Celiac disease: Secondary | ICD-10-CM

## 2014-05-04 DIAGNOSIS — Z0000 Encounter for general adult medical examination without abnormal findings: Secondary | ICD-10-CM

## 2014-05-04 MED ORDER — CROMOLYN SODIUM 5.2 MG/ACT NA AERS: 2.0000 | INHALATION_SPRAY | Freq: Three times a day (TID) | NASAL | Status: DC

## 2014-05-04 NOTE — Assessment & Plan Note (Signed)
Has not had a good response to nasal steroids. Adding cromolyn., Gluten testing per patient request

## 2014-05-04 NOTE — Patient Instructions (Signed)
Globus Syndrome  Globus Syndrome is a feeling of a lump or a sensation of something caught in your throat. Eating food or drinking fluids does not seem to get rid of it. Yet it is not noticeable during the actual act of swallowing food or liquids. Usually there is nothing physically wrong. It is troublesome because it is an unpleasant sensation which is sometimes difficult to ignore and at times may seem to worsen. The syndrome is quite common. It is estimated 45% of the population experiences features of the condition at some stage during their lives. The symptoms are usually temporary. The largest group of people who feel the need to seek medical treatment is females between the ages of 30 to 60.   CAUSES   Globus Syndrome appears to be triggered by or aggravated by stress, anxiety and depression.  · Tension related to stress could product abnormal muscle spasms in the esophagus which would account for the sensation of a lump or ball in your throat.  · Frequent swallowing or drying of the throat caused by anxiety or other strong emotions can also produce this uncomfortable sensation in your throat.  · Fear and sadness can be expressed by the body in many ways. For instance, if you had a relative with throat cancer you might become overly concerned about your own health and develop uncomfortable sensations in your throat.  · The reaction to a crisis or a trauma event in your life can take the form of a lump in your throat. It is as if you are indirectly saying you can not handle or "swallow" one more thing.  DIAGNOSIS   Usually your caregiver will know what is wrong by talking to you and examining you.  If the condition persists for several days, more testing may be done to make sure there is not another problem present. This is usually not the case.  TREATMENT   · Reassurance is often the best treatment available. Usually the problem leaves without treatment over several days.  · Sometimes anti-anxiety medications  may be prescribed.  · Counseling or talk therapy can also help with strong underlying emotions.  · Note that in most cases this is not something that keeps coming back and you should not be concerned or worried.  Document Released: 11/08/2003 Document Revised: 11/10/2011 Document Reviewed: 04/06/2008  [employer]® Patient Information ©2015 [employer]. This information is not intended to replace advice given to you by your health care provider. Make sure you discuss any questions you have with your health care provider.

## 2014-05-04 NOTE — Assessment & Plan Note (Signed)
Patient will be returning for his physical, I am going to add routine blood work.

## 2014-05-04 NOTE — Progress Notes (Signed)
  Subjective:    CC: Scratchy throat  HPI: Brian Spears returns to see me, he has known allergic rhinitis with postnasal drip, he also has eustachian tube dysfunction and is intolerant of multiple intranasal steroids, he has never tried intranasal mast cell stabilizers. He continues to have scratchiness in his throat, and he also endorses a sensation of fullness in his throat during times of stress. No other URI symptoms.  Past medical history, Surgical history, Family history not pertinant except as noted below, Social history, Allergies, and medications have been entered into the medical record, reviewed, and no changes needed.   Review of Systems: No fevers, chills, night sweats, weight loss, chest pain, or shortness of breath.   Objective:    General: Well Developed, well nourished, and in no acute distress.  Neuro: Alert and oriented x3, extra-ocular muscles intact, sensation grossly intact.  HEENT: Normocephalic, atraumatic, pupils equal round reactive to light, neck supple, no masses, no lymphadenopathy, thyroid nonpalpable. Oropharynx, nasopharynx, external ear canals are mostly unremarkable with the exception of mild erythema in the posterior oropharynx. Skin: Warm and dry, no rashes. Cardiac: Regular rate and rhythm, no murmurs rubs or gallops, no lower extremity edema.  Respiratory: Clear to auscultation bilaterally. Not using accessory muscles, speaking in full sentences.  Impression and Recommendations:

## 2014-05-05 LAB — CBC
HCT: 43 % (ref 39.0–52.0)
Hemoglobin: 14.7 g/dL (ref 13.0–17.0)
MCH: 28.5 pg (ref 26.0–34.0)
MCHC: 34.2 g/dL (ref 30.0–36.0)
MCV: 83.3 fL (ref 78.0–100.0)
Platelets: 282 K/uL (ref 150–400)
RBC: 5.16 MIL/uL (ref 4.22–5.81)
RDW: 13.6 % (ref 11.5–15.5)
WBC: 6.1 10*3/uL (ref 4.0–10.5)

## 2014-05-05 LAB — HEMOGLOBIN A1C
Hgb A1c MFr Bld: 5.8 % — ABNORMAL HIGH (ref <5.7)
Mean Plasma Glucose: 120 mg/dL — ABNORMAL HIGH (ref <117)

## 2014-05-06 LAB — COMPREHENSIVE METABOLIC PANEL WITH GFR
ALT: 41 U/L (ref 0–53)
AST: 24 U/L (ref 0–37)
Albumin: 4.4 g/dL (ref 3.5–5.2)
Alkaline Phosphatase: 53 U/L (ref 39–117)
Calcium: 9.7 mg/dL (ref 8.4–10.5)
Chloride: 103 meq/L (ref 96–112)
Potassium: 4.6 meq/L (ref 3.5–5.3)
Sodium: 139 meq/L (ref 135–145)
Total Protein: 7.1 g/dL (ref 6.0–8.3)

## 2014-05-06 LAB — COMPREHENSIVE METABOLIC PANEL
BUN: 12 mg/dL (ref 6–23)
CO2: 26 mEq/L (ref 19–32)
Creat: 0.83 mg/dL (ref 0.50–1.35)
Glucose, Bld: 94 mg/dL (ref 70–99)
Total Bilirubin: 0.6 mg/dL (ref 0.2–1.2)

## 2014-05-06 LAB — TSH: TSH: 2.196 u[IU]/mL (ref 0.350–4.500)

## 2014-05-06 LAB — LIPID PANEL
Cholesterol: 138 mg/dL (ref 0–200)
HDL: 61 mg/dL (ref >39)
LDL Cholesterol: 65 mg/dL (ref 0–99)
Total CHOL/HDL Ratio: 2.3 Ratio
Triglycerides: 61 mg/dL (ref <150)
VLDL: 12 mg/dL (ref 0–40)

## 2014-05-09 ENCOUNTER — Encounter: Payer: Self-pay | Admitting: Sports Medicine

## 2014-05-09 LAB — GLIA (IGA/G) + TTG IGA
Deamidated Gliadin Abs, IgG: 34.5 U/mL — ABNORMAL HIGH (ref <20)
Gliadin IgA: 5.6 U/mL (ref <20)
Tissue Transglutaminase Ab, IgA: 5 U/mL (ref <20)

## 2014-05-09 LAB — TISSUE TRANSGLUTAMINASE, IGG: t-Transglutaminase (tTG) IgG: 7.8 U/mL (ref <20)

## 2014-05-15 ENCOUNTER — Other Ambulatory Visit: Payer: Self-pay | Admitting: *Deleted

## 2014-05-15 ENCOUNTER — Other Ambulatory Visit: Payer: Self-pay | Admitting: Sports Medicine

## 2014-05-15 MED ORDER — FLUOCINOLONE ACETONIDE 0.01 % OT OIL: 5.0000 [drp] | TOPICAL_OIL | Freq: Two times a day (BID) | OTIC | Status: DC | PRN

## 2014-05-15 MED ORDER — FLUTICASONE-SALMETEROL 100-50 MCG/DOSE IN AEPB: 1.0000 | INHALATION_SPRAY | Freq: Two times a day (BID) | RESPIRATORY_TRACT | Status: DC

## 2014-05-22 ENCOUNTER — Encounter: Payer: Self-pay | Admitting: Sports Medicine

## 2014-05-24 ENCOUNTER — Other Ambulatory Visit: Payer: Self-pay | Admitting: Sports Medicine

## 2014-05-24 DIAGNOSIS — F419 Anxiety disorder, unspecified: Secondary | ICD-10-CM

## 2014-05-25 MED ORDER — ALPRAZOLAM 0.5 MG PO TABS: 0.5000 mg | ORAL_TABLET | Freq: Two times a day (BID) | ORAL | Status: DC | PRN

## 2014-06-01 ENCOUNTER — Ambulatory Visit (INDEPENDENT_AMBULATORY_CARE_PROVIDER_SITE_OTHER): Payer: BC Managed Care – PPO | Admitting: Sports Medicine

## 2014-06-01 ENCOUNTER — Encounter: Payer: Self-pay | Admitting: Sports Medicine

## 2014-06-01 VITALS — BP 131/82 | HR 87 | Ht 68.0 in | Wt 210.0 lb

## 2014-06-01 DIAGNOSIS — F419 Anxiety disorder, unspecified: Secondary | ICD-10-CM | POA: Diagnosis not present

## 2014-06-01 DIAGNOSIS — K900 Celiac disease: Secondary | ICD-10-CM

## 2014-06-01 DIAGNOSIS — Z23 Encounter for immunization: Secondary | ICD-10-CM | POA: Diagnosis not present

## 2014-06-01 DIAGNOSIS — Z0000 Encounter for general adult medical examination without abnormal findings: Secondary | ICD-10-CM

## 2014-06-01 NOTE — Assessment & Plan Note (Signed)
Flu shot

## 2014-06-01 NOTE — Progress Notes (Signed)
  Subjective:    CC: Followup  HPI: Celiac disease: Doing well with avoidance of gluten containing foods.  Stress: He does get a globus type sensation, he has found some biofeedback measures for resolving this, including going for walks and doing pushups.  Preventative measures: Due for a flu shot.  Past medical history, Surgical history, Family history not pertinant except as noted below, Social history, Allergies, and medications have been entered into the medical record, reviewed, and no changes needed.   Review of Systems: No fevers, chills, night sweats, weight loss, chest pain, or shortness of breath.   Objective:    General: Well Developed, well nourished, and in no acute distress.  Neuro: Alert and oriented x3, extra-ocular muscles intact, sensation grossly intact.  HEENT: Normocephalic, atraumatic, pupils equal round reactive to light, neck supple, no masses, no lymphadenopathy, thyroid nonpalpable.  Skin: Warm and dry, no rashes. Cardiac: Regular rate and rhythm, no murmurs rubs or gallops, no lower extremity edema.  Respiratory: Clear to auscultation bilaterally. Not using accessory muscles, speaking in full sentences.  Impression and Recommendations:

## 2014-06-01 NOTE — Addendum Note (Signed)
Addended by: Bo Mcclintock C on: 06/01/2014 09:30 AM   Modules accepted: Orders

## 2014-06-01 NOTE — Assessment & Plan Note (Signed)
This tends to produce a globus sensation. He does have some biofeedback types of stress relief, this tends to work well. I have asked him to minimize use of alprazolam, and further use of biofeedback for stress relief.

## 2014-06-01 NOTE — Assessment & Plan Note (Signed)
With a high anti-gliadin IgG, avoidance of gluten-containing foods has worked well. Return as needed.

## 2014-06-15 ENCOUNTER — Encounter: Payer: Self-pay | Admitting: Sports Medicine

## 2014-06-22 ENCOUNTER — Other Ambulatory Visit: Payer: Self-pay | Admitting: Sports Medicine

## 2014-06-23 ENCOUNTER — Other Ambulatory Visit: Payer: Self-pay | Admitting: *Deleted

## 2014-06-23 DIAGNOSIS — F419 Anxiety disorder, unspecified: Secondary | ICD-10-CM

## 2014-06-23 MED ORDER — ALPRAZOLAM 0.5 MG PO TABS: 0.5000 mg | ORAL_TABLET | Freq: Two times a day (BID) | ORAL | Status: DC | PRN

## 2014-06-23 NOTE — Telephone Encounter (Signed)
My chart rx request. Margette Fast, CMA

## 2014-07-20 ENCOUNTER — Other Ambulatory Visit: Payer: Self-pay | Admitting: Sports Medicine

## 2014-07-20 ENCOUNTER — Encounter: Payer: Self-pay | Admitting: Sports Medicine

## 2014-07-20 DIAGNOSIS — F419 Anxiety disorder, unspecified: Secondary | ICD-10-CM

## 2014-07-21 ENCOUNTER — Other Ambulatory Visit: Payer: Self-pay | Admitting: Sports Medicine

## 2014-07-21 ENCOUNTER — Other Ambulatory Visit: Payer: Self-pay

## 2014-07-21 DIAGNOSIS — E785 Hyperlipidemia, unspecified: Secondary | ICD-10-CM

## 2014-07-21 DIAGNOSIS — F419 Anxiety disorder, unspecified: Secondary | ICD-10-CM

## 2014-07-21 MED ORDER — ATORVASTATIN CALCIUM 40 MG PO TABS: 40.0000 mg | ORAL_TABLET | Freq: Every day | ORAL | Status: DC

## 2014-07-21 MED ORDER — ALPRAZOLAM 0.5 MG PO TABS: 0.5000 mg | ORAL_TABLET | Freq: Two times a day (BID) | ORAL | Status: DC | PRN

## 2014-07-26 ENCOUNTER — Other Ambulatory Visit: Payer: Self-pay

## 2014-07-26 DIAGNOSIS — E785 Hyperlipidemia, unspecified: Secondary | ICD-10-CM

## 2014-07-26 MED ORDER — ATORVASTATIN CALCIUM 40 MG PO TABS: 40.0000 mg | ORAL_TABLET | Freq: Every day | ORAL | Status: DC

## 2014-08-14 ENCOUNTER — Other Ambulatory Visit: Payer: Self-pay | Admitting: *Deleted

## 2014-08-14 ENCOUNTER — Encounter: Payer: Self-pay | Admitting: Sports Medicine

## 2014-08-14 MED ORDER — ALBUTEROL SULFATE HFA 108 (90 BASE) MCG/ACT IN AERS: 2.0000 | INHALATION_SPRAY | RESPIRATORY_TRACT | Status: DC | PRN

## 2014-08-18 ENCOUNTER — Other Ambulatory Visit: Payer: Self-pay | Admitting: Sports Medicine

## 2014-08-21 ENCOUNTER — Other Ambulatory Visit: Payer: Self-pay

## 2014-08-21 DIAGNOSIS — F419 Anxiety disorder, unspecified: Secondary | ICD-10-CM

## 2014-08-21 MED ORDER — ALPRAZOLAM 0.5 MG PO TABS: 0.5000 mg | ORAL_TABLET | Freq: Two times a day (BID) | ORAL | Status: DC | PRN

## 2014-09-12 ENCOUNTER — Encounter: Payer: Self-pay | Admitting: Sports Medicine

## 2014-09-13 ENCOUNTER — Telehealth: Payer: Self-pay | Admitting: Sports Medicine

## 2014-09-13 NOTE — Telephone Encounter (Signed)
FYI Pt called. He was seen at ED for vomiting.  He was given Ondansetron for nausea and wanted to know if he can take Tylenol for headache. I called Triage and spoke with Levada Dy and she said to tell patient it is ok to take Tylenol while on Ondansetron.

## 2014-09-14 NOTE — Telephone Encounter (Signed)
Don't know why this was sent to me because I can not make that determination and If triage was informed why was this sent to me? Timberlee Roblero,CMA

## 2014-09-15 ENCOUNTER — Other Ambulatory Visit: Payer: Self-pay | Admitting: Sports Medicine

## 2014-09-15 DIAGNOSIS — F419 Anxiety disorder, unspecified: Secondary | ICD-10-CM

## 2014-09-15 MED ORDER — ALPRAZOLAM 0.5 MG PO TABS: 0.5000 mg | ORAL_TABLET | Freq: Two times a day (BID) | ORAL | Status: DC | PRN

## 2014-09-30 ENCOUNTER — Encounter: Payer: Self-pay | Admitting: Emergency Medicine

## 2014-09-30 ENCOUNTER — Emergency Department (INDEPENDENT_AMBULATORY_CARE_PROVIDER_SITE_OTHER)
Admission: EM | Admit: 2014-09-30 | Discharge: 2014-09-30 | Disposition: A | Discharge status: Home / Self Care | Payer: BLUE CROSS/BLUE SHIELD | Source: Home / Self Care | Attending: Family Medicine | Admitting: Family Medicine

## 2014-09-30 DIAGNOSIS — R1011 Right upper quadrant pain: Secondary | ICD-10-CM

## 2014-09-30 LAB — POCT CBC W AUTO DIFF (K'VILLE URGENT CARE)

## 2014-09-30 LAB — POCT URINALYSIS DIPSTICK
Bilirubin, UA: NEGATIVE
Blood, UA: NEGATIVE
GLUCOSE UA: NEGATIVE
Ketones, UA: NEGATIVE
Leukocytes, UA: NEGATIVE
Nitrite, UA: NEGATIVE
Protein, UA: NEGATIVE
SPEC GRAV UA: 1.02 (ref 1.005–1.03)
Urobilinogen, UA: 0.2 (ref 0–1)
pH, UA: 5.5 (ref 5–8)

## 2014-09-30 NOTE — ED Notes (Signed)
Reports having severe nausea and vomiting 2 weeks ago; 2 days ago began having intermittent pain lateral/upper right ribs; sometimes positional and noticed after doing push-ups and eating yesterday.

## 2014-09-30 NOTE — Discharge Instructions (Signed)
Eat a low fat diet.  Drink plenty of fluids. If symptoms become significantly worse during the night or over the weekend, proceed to the local emergency room.    Biliary Colic  Biliary colic is a steady or irregular pain in the upper abdomen. It is usually under the right side of the rib cage. It happens when gallstones interfere with the normal flow of bile from the gallbladder. Bile is a liquid that helps to digest fats. Bile is made in the liver and stored in the gallbladder. When you eat a meal, bile passes from the gallbladder through the cystic duct and the common bile duct into the small intestine. There, it mixes with partially digested food. If a gallstone blocks either of these ducts, the normal flow of bile is blocked. The muscle cells in the bile duct contract forcefully to try to move the stone. This causes the pain of biliary colic.  SYMPTOMS   A person with biliary colic usually complains of pain in the upper abdomen. This pain can be:  In the center of the upper abdomen just below the breastbone.  In the upper-right part of the abdomen, near the gallbladder and liver.  Spread back toward the right shoulder blade.  Nausea and vomiting.  The pain usually occurs after eating.  Biliary colic is usually triggered by the digestive system's demand for bile. The demand for bile is high after fatty meals. Symptoms can also occur when a person who has been fasting suddenly eats a very large meal. Most episodes of biliary colic pass after 1 to 5 hours. After the most intense pain passes, your abdomen may continue to ache mildly for about 24 hours. DIAGNOSIS  After you describe your symptoms, your caregiver will perform a physical exam. He or she will pay attention to the upper right portion of your belly (abdomen). This is the area of your liver and gallbladder. An ultrasound will help your caregiver look for gallstones. Specialized scans of the gallbladder may also be done. Blood tests  may be done, especially if you have fever or if your pain persists. PREVENTION  Biliary colic can be prevented by controlling the risk factors for gallstones. Some of these risk factors, such as heredity, increasing age, and pregnancy are a normal part of life. Obesity and a high-fat diet are risk factors you can change through a healthy lifestyle. Women going through menopause who take hormone replacement therapy (estrogen) are also more likely to develop biliary colic. TREATMENT   Pain medication may be prescribed.  You may be encouraged to eat a fat-free diet.  If the first episode of biliary colic is severe, or episodes of colic keep retuning, surgery to remove the gallbladder (cholecystectomy) is usually recommended. This procedure can be done through small incisions using an instrument called a laparoscope. The procedure often requires a brief stay in the hospital. Some people can leave the hospital the same day. It is the most widely used treatment in people troubled by painful gallstones. It is effective and safe, with no complications in more than 90% of cases.  If surgery cannot be done, medication that dissolves gallstones may be used. This medication is expensive and can take months or years to work. Only small stones will dissolve.  Rarely, medication to dissolve gallstones is combined with a procedure called shock-wave lithotripsy. This procedure uses carefully aimed shock waves to break up gallstones. In many people treated with this procedure, gallstones form again within a few years. PROGNOSIS  If gallstones block your cystic duct or common bile duct, you are at risk for repeated episodes of biliary colic. There is also a 25% chance that you will develop a gallbladder infection(acute cholecystitis), or some other complication of gallstones within 10 to 20 years. If you have surgery, schedule it at a time that is convenient for you and at a time when you are not sick. HOME CARE  INSTRUCTIONS   Drink plenty of clear fluids.  Avoid fatty, greasy or fried foods, or any foods that make your pain worse.  Take medications as directed. SEEK MEDICAL CARE IF:   You develop a fever over 100.5 F (38.1 C).  Your pain gets worse over time.  You develop nausea that prevents you from eating and drinking.  You develop vomiting. SEEK IMMEDIATE MEDICAL CARE IF:   You have continuous or severe belly (abdominal) pain which is not relieved with medications.  You develop nausea and vomiting which is not relieved with medications.  You have symptoms of biliary colic and you suddenly develop a fever and shaking chills. This may signal cholecystitis. Call your caregiver immediately.  You develop a yellow color to your skin or the white part of your eyes (jaundice). Document Released: 01/19/2006 Document Revised: 11/10/2011 Document Reviewed: 03/30/2008 [facility] Patient Information 2015 [place], [place]. This information is not intended to replace advice given to you by your health care provider. Make sure you discuss any questions you have with your health care provider.

## 2014-09-30 NOTE — ED Provider Notes (Signed)
CSN: [id number]     Arrival date & time 09/30/14  1726 History   First MD Initiated Contact with Patient 09/30/14 1819     Chief Complaint  Patient presents with  . Chest Pain      HPI Comments: Patient reports have an episode of severe nausea/vomiting about two weeks ago that resolved.  Two days ago he developed intermittent pain in his right upper quadrant and right lower ribs.  The pain is sometimes positional.  While eating yesterday he developed the pain, and last night he was awakened by the pain.  No fevers, chills, and sweats  There is a family history of gall bladder disease in his mother and sister.  Patient is a 39 y.o. male presenting with abdominal pain. The history is provided by the patient and the spouse.  Abdominal Pain Pain location:  RUQ Pain quality: aching   Pain radiates to:  Does not radiate Pain severity:  Mild Onset quality:  Sudden Duration:  2 days Timing:  Intermittent Progression:  Waxing and waning Chronicity:  New Context: eating   Relieved by:  Nothing Worsened by:  Eating Ineffective treatments:  None tried Associated symptoms: anorexia, chest pain and vomiting   Associated symptoms: no belching, no chills, no constipation, no cough, no diarrhea, no dysuria, no fatigue, no fever, no hematemesis, no hematochezia, no hematuria, no melena, no nausea, no shortness of breath and no sore throat     Past Medical History  Diagnosis Date  . Hand fracture   . Normal cardiac stress test 4/10    at [facility] - negative.  terminated for fatigue  . Hyperlipidemia   . Lyme disease    Past Surgical History  Procedure Laterality Date  . Surgery on right hand     Family History  Problem Relation Age of Onset  . Breast cancer Mother   . Hyperlipidemia Mother   . Hypertension Father   . Hyperlipidemia Father   . Other Other     grandfather with alcoholism  . Heart attack Other     grandmother  . Diabetes Other     grandmother  . Colon cancer Other    uncle  . Breast cancer Other     aunt  . Lung cancer Other     grandmother  . Prostate cancer Other     grandfather  . Melanoma Other     uncle   History  Substance Use Topics  . Smoking status: Former Smoker -- 0.20 packs/day    Types: Cigarettes  . Smokeless tobacco: Former Systems developer    Types: Snuff    Quit date: 06/02/2011     Comment: started smoking at age 85, started smokeless tobacco at age 64  . Alcohol Use: No    Review of Systems  Constitutional: Negative for fever, chills and fatigue.  HENT: Negative for sore throat.   Respiratory: Negative for cough and shortness of breath.   Cardiovascular: Positive for chest pain.  Gastrointestinal: Positive for vomiting, abdominal pain and anorexia. Negative for nausea, diarrhea, constipation, melena, hematochezia and hematemesis.  Genitourinary: Negative for dysuria and hematuria.  All other systems reviewed and are negative.   Allergies  Sulfa antibiotics and Naproxen sodium  Home Medications   Prior to Admission medications   Medication Sig Start Date End Date Taking? Authorizing Provider  albuterol (PROVENTIL HFA;VENTOLIN HFA) 108 (90 BASE) MCG/ACT inhaler Inhale 2 puffs into the lungs every 4 (four) hours as needed for wheezing or shortness of breath. 08/14/14  Jade L Breeback, PA-C  ALPRAZolam (XANAX) 0.5 MG tablet Take 1 tablet (0.5 mg total) by mouth 2 (two) times daily as needed for anxiety. 09/15/14   Silverio Decamp, MD  atorvastatin (LIPITOR) 40 MG tablet Take 1 tablet (40 mg total) by mouth daily. 07/26/14   Silverio Decamp, MD  Fluocinolone Acetonide 0.01 % OIL Place 5 drops in ear(s) 2 (two) times daily as needed. 05/15/14   Silverio Decamp, MD  Fluticasone-Salmeterol (ADVAIR DISKUS) 100-50 MCG/DOSE AEPB Inhale 1 puff into the lungs 2 (two) times daily. 05/15/14   Silverio Decamp, MD  Probiotic Product (PROBIOTIC FORMULA PO) Take 2 tablets by mouth 2 (two) times daily.     Historical Provider,  MD   BP 119/75 mmHg  Pulse 82  Temp(Src) 97.5 F (36.4 C) (Oral)  Resp 16  Ht 5\' 8"  (1.727 m)  Wt 198 lb (89.812 kg)  BMI 30.11 kg/m2  SpO2 97% Physical Exam Nursing notes and Vital Signs reviewed. Appearance:  Patient appears stated age, and in no acute distress.  Patient is obese (BMI 30.1) Eyes:  Pupils are equal, round, and reactive to light and accomodation.  Extraocular movement is intact.  Conjunctivae are not inflamed  Ears:  Canals normal.  Tympanic membranes normal.  Nose:  Mildly congested turbinates.  No sinus tenderness.  Pharynx:  Normal Neck:  Supple.  No adenopathy Chest:  No tenderness to palpation  Lungs:  Clear to auscultation.  Breath sounds are equal.  Heart:  Regular rate and rhythm without murmurs, rubs, or gallops.  Abdomen:   Mild tenderness right upper quadrant without masses or hepatosplenomegaly.  Bowel sounds are present.  No CVA or flank tenderness.  Extremities:  No edema.  No calf tenderness Skin:  No rash present.   ED Course  Procedures  None    Labs Reviewed  COMPLETE METABOLIC PANEL WITH GFR  AMYLASE  LIPASE  POCT URINALYSIS DIPSTICK: Negative  POCT CBC W AUTO DIFF ([facility]):  WBC 9.2; LY 38.7; MO 11.8; GR 49.5; Hgb 15.2; Platelets 251       MDM   1. Abdominal pain, right upper quadrant; ?biliary colic    CMP, lipase, amylase pending. Schedule GB ultrasound. Eat a low fat diet.  Drink plenty of fluids. If symptoms become significantly worse during the night or over the weekend, proceed to the local emergency room.  Followup with Family Doctor in about 3 days.    Kandra Nicolas, MD 10/01/14 1352

## 2014-09-30 NOTE — ED Notes (Signed)
Currently in no pain but states it bothered him earlier today.

## 2014-10-01 LAB — COMPLETE METABOLIC PANEL WITH GFR
ALT: 25 U/L (ref 0–53)
AST: 20 U/L (ref 0–37)
Albumin: 4.2 g/dL (ref 3.5–5.2)
Alkaline Phosphatase: 66 U/L (ref 39–117)
BUN: 20 mg/dL (ref 6–23)
CO2: 28 mEq/L (ref 19–32)
Calcium: 9.9 mg/dL (ref 8.4–10.5)
Chloride: 102 mEq/L (ref 96–112)
Creat: 1.1 mg/dL (ref 0.50–1.35)
GFR, Est Non African American: 84 mL/min
Glucose, Bld: 89 mg/dL (ref 70–99)
Potassium: 4.6 mEq/L (ref 3.5–5.3)
SODIUM: 138 meq/L (ref 135–145)
Total Bilirubin: 0.4 mg/dL (ref 0.2–1.2)
Total Protein: 7.2 g/dL (ref 6.0–8.3)

## 2014-10-01 LAB — AMYLASE: AMYLASE: 52 U/L (ref 0–105)

## 2014-10-01 LAB — LIPASE: Lipase: 26 U/L (ref 0–75)

## 2014-10-02 ENCOUNTER — Telehealth: Payer: Self-pay | Admitting: *Deleted

## 2014-10-02 ENCOUNTER — Encounter: Payer: Self-pay | Admitting: Emergency Medicine

## 2014-10-02 ENCOUNTER — Ambulatory Visit (INDEPENDENT_AMBULATORY_CARE_PROVIDER_SITE_OTHER): Payer: BLUE CROSS/BLUE SHIELD

## 2014-10-02 ENCOUNTER — Emergency Department (INDEPENDENT_AMBULATORY_CARE_PROVIDER_SITE_OTHER)
Admission: EM | Admit: 2014-10-02 | Discharge: 2014-10-02 | Disposition: A | Discharge status: Home / Self Care | Payer: BLUE CROSS/BLUE SHIELD | Source: Home / Self Care | Attending: Emergency Medicine | Admitting: Emergency Medicine

## 2014-10-02 DIAGNOSIS — R1011 Right upper quadrant pain: Secondary | ICD-10-CM | POA: Diagnosis not present

## 2014-10-02 DIAGNOSIS — S39011D Strain of muscle, fascia and tendon of abdomen, subsequent encounter: Secondary | ICD-10-CM

## 2014-10-02 DIAGNOSIS — R112 Nausea with vomiting, unspecified: Secondary | ICD-10-CM

## 2014-10-02 NOTE — ED Notes (Signed)
Here to go over GB U/S results ordered for diagnosis flank pain on 09/30/14 here in [facility].

## 2014-10-02 NOTE — ED Provider Notes (Signed)
CSN: [id number]     Arrival date & time 10/02/14  1107 History   First MD Initiated Contact with Patient 10/02/14 1107     Chief Complaint  Patient presents with  . Follow-up   (Consider location/radiation/quality/duration/timing/severity/associated sxs/prior Treatment) HPI Was seen here in urgent care by Dr. Assunta Found for right upper quadrant pain on 09/30/14. CBC and urinalysis was within normal limits.--CMP, amylase, lipase was ordered by Dr. Assunta Found and sent to reference lab. Dr. Assunta Found ordered ultrasound gallbladder, which was completed this morning and patient came over to urgent care to review results. His right upper quadrant pain is improving, still feels sore. He feels is not related to meals. No nausea or vomiting or change of bowel habits. No chest pain or shortness of breath. Further history: Patient recalls that he had a vomiting episode with viral gastroenteritis about 2 weeks ago (in that episode completely resolved that day) and he feels he may have strained abdominal wall muscles at that time. Past Medical History  Diagnosis Date  . Hand fracture   . Normal cardiac stress test 4/10    at [facility] - negative.  terminated for fatigue  . Hyperlipidemia   . Lyme disease    Past Surgical History  Procedure Laterality Date  . Surgery on right hand     Family History  Problem Relation Age of Onset  . Breast cancer Mother   . Hyperlipidemia Mother   . Hypertension Father   . Hyperlipidemia Father   . Other Other     grandfather with alcoholism  . Heart attack Other     grandmother  . Diabetes Other     grandmother  . Colon cancer Other     uncle  . Breast cancer Other     aunt  . Lung cancer Other     grandmother  . Prostate cancer Other     grandfather  . Melanoma Other     uncle   History  Substance Use Topics  . Smoking status: Former Smoker -- 0.20 packs/day    Types: Cigarettes  . Smokeless tobacco: Former Systems developer    Types: Snuff    Quit date: 06/02/2011      Comment: started smoking at age 26, started smokeless tobacco at age 66  . Alcohol Use: No    Review of Systems  All other systems reviewed and are negative.   Allergies  Sulfa antibiotics and Naproxen sodium  Home Medications   Prior to Admission medications   Medication Sig Start Date End Date Taking? Authorizing Provider  albuterol (PROVENTIL HFA;VENTOLIN HFA) 108 (90 BASE) MCG/ACT inhaler Inhale 2 puffs into the lungs every 4 (four) hours as needed for wheezing or shortness of breath. 08/14/14   Jade L Breeback, PA-C  ALPRAZolam (XANAX) 0.5 MG tablet Take 1 tablet (0.5 mg total) by mouth 2 (two) times daily as needed for anxiety. 09/15/14   Silverio Decamp, MD  atorvastatin (LIPITOR) 40 MG tablet Take 1 tablet (40 mg total) by mouth daily. 07/26/14   Silverio Decamp, MD  Fluocinolone Acetonide 0.01 % OIL Place 5 drops in ear(s) 2 (two) times daily as needed. 05/15/14   Silverio Decamp, MD  Fluticasone-Salmeterol (ADVAIR DISKUS) 100-50 MCG/DOSE AEPB Inhale 1 puff into the lungs 2 (two) times daily. 05/15/14   Silverio Decamp, MD  Probiotic Product (PROBIOTIC FORMULA PO) Take 2 tablets by mouth 2 (two) times daily.     Historical Provider, MD   BP 112/74 mmHg  Pulse  82  Temp(Src) 98.5 F (36.9 C) (Oral)  Resp 16  SpO2 97% Physical Exam  Constitutional: He is oriented to person, place, and time. He appears well-developed and well-nourished. No distress.  HENT:  Head: Normocephalic and atraumatic.  Eyes: Conjunctivae and EOM are normal. Pupils are equal, round, and reactive to light. No scleral icterus.  Neck: Normal range of motion.  Cardiovascular: Normal rate.   Pulmonary/Chest: Effort normal.  Abdominal: Soft. He exhibits no distension. There is no tenderness.  Musculoskeletal: Normal range of motion.  Neurological: He is alert and oriented to person, place, and time.  Skin: Skin is warm.  Psychiatric: He has a normal mood and affect.  Nursing note and  vitals reviewed.   ED Course  Procedures (including critical care time) Labs Review Labs Reviewed - No data to display Results for orders placed or performed during the hospital encounter of 09/30/14  COMPLETE METABOLIC PANEL WITH GFR  Result Value Ref Range   Sodium 138 135 - 145 mEq/L   Potassium 4.6 3.5 - 5.3 mEq/L   Chloride 102 96 - 112 mEq/L   CO2 28 19 - 32 mEq/L   Glucose, Bld 89 70 - 99 mg/dL   BUN 20 6 - 23 mg/dL   Creat 1.10 0.50 - 1.35 mg/dL   Total Bilirubin 0.4 0.2 - 1.2 mg/dL   Alkaline Phosphatase 66 39 - 117 U/L   AST 20 0 - 37 U/L   ALT 25 0 - 53 U/L   Total Protein 7.2 6.0 - 8.3 g/dL   Albumin 4.2 3.5 - 5.2 g/dL   Calcium 9.9 8.4 - 10.5 mg/dL   GFR, Est African American >89 mL/min   GFR, Est Non African American 84 mL/min  Amylase  Result Value Ref Range   Amylase 52 0 - 105 U/L  Lipase  Result Value Ref Range   Lipase 26 0 - 75 U/L  POCT urinalysis dipstick  Result Value Ref Range   Color, UA yellow    Clarity, UA clear    Glucose, UA NEG    Bilirubin, UA neg    Ketones, UA neg    Spec Grav, UA 1.020 1.005 - 1.03   Blood, UA neg    pH, UA 5.5 5 - 8   Protein, UA neg    Urobilinogen, UA 0.2 0 - 1   Nitrite, UA neg    Leukocytes, UA Negative   CBC w auto diff ([facility])  Result Value Ref Range   WBC  4.5 - 10.5 K/uL   Lymphocytes relative %  15 - 45 %   Monocytes relative %  2 - 10 %   Neutrophils relative % (GR)  44 - 76 %   Lymphocytes absolute  0.1 - 1.8 K/uL   Monocyes absolute  0.1 - 1 K/uL   Neutrophils absolute (GR#)  1.7 - 7.7 K/uL   RBC  4.2 - 5.8 MIL/uL   Hemoglobin  13 - 17 g/dL   Hematocrit  38.5 - 51 %   MCV  80 - 98 fL   MCH  26.5 - 32.5 pg   MCHC  32.5 - 36.9 g/dL   RDW  11.6 - 14 %   Platelet count  140 - 400 K/uL   MPV  7.8 - 11 fL    Imaging Review US Abdomen Complete  10/02/2014   CLINICAL DATA:  Right upper quadrant abdominal pain over the past 2 days. Recent nausea and vomiting.  EXAM:  ULTRASOUND  ABDOMEN COMPLETE  COMPARISON:  None.  FINDINGS: Gallbladder: No gallstones or wall thickening visualized. No sonographic Murphy sign noted.  Common bile duct: Diameter: 3 mm  Liver: No focal lesion identified. Within normal limits in parenchymal echogenicity.  IVC: Not seen due to overlying bowel gas.  Pancreas: Visualized portions the pancreatic head and body appear normal; pancreatic tail not well seen due to overlying bowel gas.  Spleen: Size and appearance within normal limits.  Right Kidney: Length: 10.3 cm. Echogenicity within normal limits. No mass or hydronephrosis visualized.  Left Kidney: Length: 12.1 cm. Echogenicity within normal limits. No mass or hydronephrosis visualized.  Abdominal aorta: No aneurysm visualized.  Other findings: None.  IMPRESSION: 1. No specific abnormality is identified. Portions of the pancreas and IVC were not well seen due to overlying bowel gas.   Electronically Signed   By: Sherryl Barters M.D.   On: 10/02/2014 11:17     MDM   1. Abdominal pain, right upper quadrant   2. Strain of abdominal muscle, subsequent encounter    Over 15 minutes spent, greater than 50% of the time spent for counseling and coordination of care. Reviewed at length that CBC, CMP, amylase, lipase, urinalysis, abdominal ultrasound all within normal limits. Gave him copies of results at his request. Likely has had epigastric and right upper quadrant muscular discomfort from episode of vomiting related to gastroenteritis 2 weeks ago. He declined any particular treatment. He declined trial of an H2 blocker or PPI. Follow-up with Dr. Dianah Field, his PCP.  Precautions discussed. Red flags discussed. Questions invited and answered. Patient voiced understanding and agreement.     Jacqulyn Cane, MD 10/03/14 [phone]

## 2014-10-02 NOTE — ED Notes (Signed)
Patient states he had no further episodes of right upper/lateral rib pain between 09/30/14 and this morning.

## 2014-10-03 ENCOUNTER — Encounter: Payer: Self-pay | Admitting: Sports Medicine

## 2014-10-03 ENCOUNTER — Ambulatory Visit (INDEPENDENT_AMBULATORY_CARE_PROVIDER_SITE_OTHER): Payer: BLUE CROSS/BLUE SHIELD | Admitting: Sports Medicine

## 2014-10-03 VITALS — BP 114/76 | HR 89 | Ht 68.0 in | Wt 200.0 lb

## 2014-10-03 DIAGNOSIS — E785 Hyperlipidemia, unspecified: Secondary | ICD-10-CM | POA: Diagnosis not present

## 2014-10-03 DIAGNOSIS — G5602 Carpal tunnel syndrome, left upper limb: Secondary | ICD-10-CM | POA: Diagnosis not present

## 2014-10-03 DIAGNOSIS — Z0000 Encounter for general adult medical examination without abnormal findings: Secondary | ICD-10-CM

## 2014-10-03 LAB — COMPREHENSIVE METABOLIC PANEL
ALT: 37 U/L (ref 0–53)
AST: 24 U/L (ref 0–37)
Alkaline Phosphatase: 60 U/L (ref 39–117)
BUN: 11 mg/dL (ref 6–23)
CO2: 26 mEq/L (ref 19–32)
Chloride: 105 mEq/L (ref 96–112)
Glucose, Bld: 103 mg/dL — ABNORMAL HIGH (ref 70–99)
Potassium: 4.8 mEq/L (ref 3.5–5.3)
Total Protein: 7.2 g/dL (ref 6.0–8.3)

## 2014-10-03 LAB — CBC
HCT: 44.6 % (ref 39.0–52.0)
Hemoglobin: 14.7 g/dL (ref 13.0–17.0)
MCH: 27.3 pg (ref 26.0–34.0)
MCHC: 33 g/dL (ref 30.0–36.0)
MCV: 82.9 fL (ref 78.0–100.0)
MPV: 10.3 fL (ref 8.6–12.4)
Platelets: 281 10*3/uL (ref 150–400)
RBC: 5.38 MIL/uL (ref 4.22–5.81)
RDW: 13.4 % (ref 11.5–15.5)
WBC: 6.6 10*3/uL (ref 4.0–10.5)

## 2014-10-03 LAB — LIPID PANEL
Cholesterol: 134 mg/dL (ref 0–200)
HDL: 44 mg/dL (ref >39)
LDL Cholesterol: 76 mg/dL (ref 0–99)
Total CHOL/HDL Ratio: 3 Ratio
Triglycerides: 71 mg/dL (ref <150)
VLDL: 14 mg/dL (ref 0–40)

## 2014-10-03 LAB — COMPREHENSIVE METABOLIC PANEL WITH GFR
Albumin: 4.1 g/dL (ref 3.5–5.2)
Calcium: 9.5 mg/dL (ref 8.4–10.5)
Creat: 0.83 mg/dL (ref 0.50–1.35)
Sodium: 139 meq/L (ref 135–145)
Total Bilirubin: 0.5 mg/dL (ref 0.2–1.2)

## 2014-10-03 LAB — HEMOGLOBIN A1C
Hgb A1c MFr Bld: 5.8 % — ABNORMAL HIGH (ref <5.7)
Mean Plasma Glucose: 120 mg/dL — ABNORMAL HIGH (ref <117)

## 2014-10-03 NOTE — Assessment & Plan Note (Signed)
We had a discussion about the risks, benefits, and limitations of the PSA test and we will proceed per patient request.

## 2014-10-03 NOTE — Progress Notes (Signed)
  Subjective:    CC:  Follow-up  HPI: Carpal tunnel syndrome: Previous median nerve hydrodissection provided 6 months of  relief desires repeat today.  Symptoms are moderate, persistent and localized in the median nerve distribution.  Obesity: good continued weight loss on his own.  Hyperlipidemia: Due to recheck lipids.  Past medical history, Surgical history, Family history not pertinant except as noted below, Social history, Allergies, and medications have been entered into the medical record, reviewed, and no changes needed.   Review of Systems: No fevers, chills, night sweats, weight loss, chest pain, or shortness of breath.   Objective:    General: Well Developed, well nourished, and in no acute distress.  Neuro: Alert and oriented x3, extra-ocular muscles intact, sensation grossly intact.  HEENT: Normocephalic, atraumatic, pupils equal round reactive to light, neck supple, no masses, no lymphadenopathy, thyroid nonpalpable.  Skin: Warm and dry, no rashes. Cardiac: Regular rate and rhythm, no murmurs rubs or gallops, no lower extremity edema.  Respiratory: Clear to auscultation bilaterally. Not using accessory muscles, speaking in full sentences.  Procedure: Real-time Ultrasound Guided Injection of left carpal tunnel/median nerve hydrodissection Device: GE Logiq E  Verbal informed consent obtained.  Time-out conducted.  Noted no overlying erythema, induration, or other signs of local infection.  Skin prepped in a sterile fashion.  Local anesthesia: Topical Ethyl chloride.  With sterile technique and under real time ultrasound guidance: 25-gauge needle advanced into the carpal tunnel,1 cc Kenalog 40, 4 cc lidocaine injected both superficial to and deep to the median nerve Completed without difficulty  Pain immediately resolved suggesting accurate placement of the medication.  Advised to call if fevers/chills, erythema, induration, drainage, or persistent bleeding.   Images permanently stored and available for review in the ultrasound unit.  Impression: Technically successful ultrasound guided injection.  Impression and Recommendations:

## 2014-10-03 NOTE — Assessment & Plan Note (Signed)
Previous median nerve hydrodissection on the left side provide 6 months of relief, repeat today.

## 2014-10-03 NOTE — Assessment & Plan Note (Signed)
Rechecking lipids. 

## 2014-10-04 LAB — PSA, TOTAL AND FREE
PSA, Free Pct: 42 % (ref >25)
PSA, Free: 0.24 ng/mL
PSA: 0.57 ng/mL (ref <=4.00)

## 2014-10-25 ENCOUNTER — Encounter: Payer: Self-pay | Admitting: Sports Medicine

## 2014-11-15 ENCOUNTER — Other Ambulatory Visit: Payer: Self-pay | Admitting: Sports Medicine

## 2014-11-15 DIAGNOSIS — F419 Anxiety disorder, unspecified: Secondary | ICD-10-CM

## 2014-11-16 MED ORDER — ALPRAZOLAM 0.5 MG PO TABS: 0.5000 mg | ORAL_TABLET | Freq: Two times a day (BID) | ORAL | Status: DC | PRN

## 2014-12-01 ENCOUNTER — Ambulatory Visit: Payer: BC Managed Care – PPO | Admitting: Sports Medicine

## 2014-12-05 ENCOUNTER — Ambulatory Visit (INDEPENDENT_AMBULATORY_CARE_PROVIDER_SITE_OTHER): Payer: BLUE CROSS/BLUE SHIELD | Admitting: Sports Medicine

## 2014-12-05 ENCOUNTER — Encounter: Payer: Self-pay | Admitting: Sports Medicine

## 2014-12-05 ENCOUNTER — Ambulatory Visit (INDEPENDENT_AMBULATORY_CARE_PROVIDER_SITE_OTHER): Payer: BLUE CROSS/BLUE SHIELD

## 2014-12-05 VITALS — BP 117/78 | HR 76 | Ht 68.0 in | Wt 195.0 lb

## 2014-12-05 DIAGNOSIS — M19021 Primary osteoarthritis, right elbow: Secondary | ICD-10-CM | POA: Insufficient documentation

## 2014-12-05 DIAGNOSIS — M25521 Pain in right elbow: Secondary | ICD-10-CM

## 2014-12-05 DIAGNOSIS — L603 Nail dystrophy: Secondary | ICD-10-CM

## 2014-12-05 DIAGNOSIS — M19022 Primary osteoarthritis, left elbow: Secondary | ICD-10-CM

## 2014-12-05 NOTE — Assessment & Plan Note (Signed)
Pain is at the joint line, lacks approximately 5 of extension. X-rays, continue elbow sleeve, he likely either has degenerative changes or loose body in the elbow joint. Return for injection.

## 2014-12-05 NOTE — Progress Notes (Signed)
  Subjective:    CC: Follow-up  HPI: Right elbow pain: Has been working out more often than usual, now having pain that he localizes over the anconeus muscle, with inability to fully extend the elbow. This is been present for a couple of months now. No trauma as far she can remember. No burning, numbness tingling, no neck pain or hand paresthesias.  Problem with toenail: Toenail has appear dystrophic for over 3 years now, he has been through 6 months of oral Lamisil. He is amenable to try toenail excision at this point.  Carpal tunnel syndrome: Symptom free post bilateral hydrodissection.  Past medical history, Surgical history, Family history not pertinant except as noted below, Social history, Allergies, and medications have been entered into the medical record, reviewed, and no changes needed.   Review of Systems: No fevers, chills, night sweats, weight loss, chest pain, or shortness of breath.   Objective:    General: Well Developed, well nourished, and in no acute distress.  Neuro: Alert and oriented x3, extra-ocular muscles intact, sensation grossly intact.  HEENT: Normocephalic, atraumatic, pupils equal round reactive to light, neck supple, no masses, no lymphadenopathy, thyroid nonpalpable.  Skin: Warm and dry, no rashes. Cardiac: Regular rate and rhythm, no murmurs rubs or gallops, no lower extremity edema.  Respiratory: Clear to auscultation bilaterally. Not using accessory muscles, speaking in full sentences. Right great toe: The toenail is dystrophic, separating. Right Elbow: Unremarkable to inspection. Inability to fully extend the elbow with approximately 5 of extension lag. Aggressive extension reproduces pain. There is also tenderness to palpation over the anconeus muscle/joint line. Strength is full to all of the above directions Stable to varus, valgus stress. Negative moving valgus stress test. Ulnar nerve does not sublux. Negative cubital tunnel  Tinel's.  Impression and Recommendations:

## 2014-12-05 NOTE — Assessment & Plan Note (Signed)
Right great toenail, will remove for nail plate excision without phenol.

## 2014-12-07 ENCOUNTER — Encounter: Payer: Self-pay | Admitting: Sports Medicine

## 2014-12-07 ENCOUNTER — Ambulatory Visit (INDEPENDENT_AMBULATORY_CARE_PROVIDER_SITE_OTHER): Payer: BLUE CROSS/BLUE SHIELD | Admitting: Sports Medicine

## 2014-12-07 VITALS — BP 116/76 | HR 83 | Wt 197.0 lb

## 2014-12-07 DIAGNOSIS — F419 Anxiety disorder, unspecified: Secondary | ICD-10-CM | POA: Diagnosis not present

## 2014-12-07 DIAGNOSIS — M19021 Primary osteoarthritis, right elbow: Secondary | ICD-10-CM

## 2014-12-07 DIAGNOSIS — L603 Nail dystrophy: Secondary | ICD-10-CM

## 2014-12-07 DIAGNOSIS — Z789 Other specified health status: Secondary | ICD-10-CM

## 2014-12-07 MED ORDER — HYDROCODONE-ACETAMINOPHEN 10-325 MG PO TABS: 1.0000 | ORAL_TABLET | Freq: Three times a day (TID) | ORAL | Status: DC | PRN

## 2014-12-07 NOTE — Assessment & Plan Note (Signed)
X-ray does confirm osteoarthritis and he lacks approximately 5 of extension. Continue elbow sleeve, and we can do an elbow joint injection at the next visit.

## 2014-12-07 NOTE — Patient Instructions (Signed)
Toenail Removal Toenails may need to be removed because of injury, infections, or to correct abnormal growth. A special non-stick bandage will likely be put tightly on your toe to prevent bleeding. Often times a new nail will grow back. Sometimes the new nail may be deformed. Most of the time when a nail is lost, it will gradually heal, but may be sensitive for a long time. HOME CARE INSTRUCTIONS   Keep your foot elevated to relieve pain and swelling. This will require lying in bed or on a couch with the leg on pillows or sitting in a recliner with the leg up. Walking or letting your leg dangle may increase swelling, slow healing, and cause throbbing pain.  Keep your bandage dry and clean.  Change your bandage in 24 hours.  After your bandage is changed, soak your foot in warm, soapy water for 10 to 20 minutes. Do this 3 times per day. This helps reduce pain and swelling. After soaking your foot apply a clean, dry bandage. Change your bandage if it is wet or dirty.  Only take over-the-counter or prescription medicines for pain, discomfort, or fever as directed by your caregiver.  See your caregiver as needed for problems. You might need a tetanus shot now if:  You have no idea when you had the last one.  You have never had a tetanus shot before.  The injured area had dirt in it. If you need a tetanus shot, and you decide not to get one, there is a rare chance of getting tetanus. Sickness from tetanus can be serious. If you did get a tetanus shot, your arm may swell, get red and warm to the touch at the shot site. This is common and not a problem. SEEK IMMEDIATE MEDICAL CARE IF:   You have increased pain, swelling, redness, warmth, drainage, or bleeding.  You have a fever.  You have swelling that spreads from your toe into your foot. Document Released: 05/17/2003 Document Revised: 11/10/2011 Document Reviewed: 08/28/2008 [facility] Patient Information 2015 [place], [place]. This  information is not intended to replace advice given to you by your health care provider. Make sure you discuss any questions you have with your health care provider.

## 2014-12-07 NOTE — Assessment & Plan Note (Signed)
Ativan given intravenous for preprocedural anxiolysis.

## 2014-12-07 NOTE — Progress Notes (Signed)
  Subjective:    CC: follow-up  HPI: Osteoarthritis of the right elbow: Amenable to injection at a future visit.  Onychodystrophy: Persistent despite many months of Lamisil. Would like nail plate removal, with chemical sparing of the nail matrix.  Past medical history, Surgical history, Family history not pertinant except as noted below, Social history, Allergies, and medications have been entered into the medical record, reviewed, and no changes needed.   Review of Systems: No fevers, chills, night sweats, weight loss, chest pain, or shortness of breath.   Objective:    General: Well Developed, well nourished, and in no acute distress.  Neuro: Alert and oriented x3, extra-ocular muscles intact, sensation grossly intact.  HEENT: Normocephalic, atraumatic, pupils equal round reactive to light, neck supple, no masses, no lymphadenopathy, thyroid nonpalpable.  Skin: Warm and dry, no rashes. Cardiac: Regular rate and rhythm, no murmurs rubs or gallops, no lower extremity edema.  Respiratory: Clear to auscultation bilaterally. Not using accessory muscles, speaking in full sentences.  Prior to the procedure Xzavien had great anxiety, I was able to give him 2 mg of Ativan via a slow push into his dorsal venous plexus on the right hand, the needle was placed myself. He relaxed immediately and the procedure was able to proceed.  Procedure:  Removal of right great toenail. Risks, benefits, alternatives explained to patient. Consent obtained. Time out conducted. Noted no overlying induration or erythema at site of injection. Toe cleaned with alcohol, then a total of 10cc lidocaine 2% infiltrated at adjacent webspaces at the location of the bifurcation of the common digital nerve to proper digital nerves.  Some lidocaine also infiltrated at hyponychium and under nail bed.  Adequate anesthesia ensured. Toe prepped and draped in a sterile fashion. Nail elevator used to separate nail plate from nail  bed. Hemostat then used to separate nail fragment from surrounding structures. Antibiotic ointment applied. Toe dressed. Advised to return if increased redness, swelling, drainage, fevers, or chills.  Impression and Recommendations:    I spent 40 minutes with this patient, greater than 50% was face-to-face time counseling regarding the above diagnoses as well as anxiety, and separate from the procedure.

## 2014-12-07 NOTE — Addendum Note (Signed)
Addended by: Silverio Decamp on: 12/07/2014 05:57 PM   Modules accepted: Level of Service

## 2014-12-07 NOTE — Assessment & Plan Note (Signed)
Removal of the left great toenail with 2 mg of Ativan for sedation. Return in one week for a wound check.

## 2014-12-08 ENCOUNTER — Telehealth: Payer: Self-pay

## 2014-12-08 MED ORDER — LORAZEPAM 2 MG/ML IJ SOLN
2.0000 mg | Freq: Once | INTRAMUSCULAR | Status: AC
  Administered 2014-12-07: 2 mg via INTRAVENOUS

## 2014-12-08 MED ORDER — LORAZEPAM 2 MG/ML IJ SOLN: 2.0000 mg | Freq: Once | INTRAMUSCULAR | Status: DC

## 2014-12-08 NOTE — Telephone Encounter (Signed)
That's fine, also on his checkout sheet I included some information on postsurgical care.

## 2014-12-08 NOTE — Telephone Encounter (Signed)
Patient called and asked if he could take Ibuprofen as needed for the pain. Advised yes. He also was asking about wound care such as when to wash the area. I advised; Change your bandage in 24 hours after placement. After your bandage is changed, soak your foot in warm, soapy water for 10 to 20 minutes. Do this 3 times per day. This helps reduce pain and swelling. After soaking your foot apply a clean, dry bandage.

## 2014-12-08 NOTE — Addendum Note (Signed)
Addended by: Doree Albee on: 12/08/2014 03:09 PM   Modules accepted: Orders, Medications

## 2014-12-14 ENCOUNTER — Ambulatory Visit (INDEPENDENT_AMBULATORY_CARE_PROVIDER_SITE_OTHER): Payer: BLUE CROSS/BLUE SHIELD | Admitting: Sports Medicine

## 2014-12-14 ENCOUNTER — Encounter: Payer: Self-pay | Admitting: Sports Medicine

## 2014-12-14 VITALS — BP 109/66 | HR 94 | Wt 198.0 lb

## 2014-12-14 DIAGNOSIS — L603 Nail dystrophy: Secondary | ICD-10-CM

## 2014-12-14 DIAGNOSIS — M19021 Primary osteoarthritis, right elbow: Secondary | ICD-10-CM | POA: Diagnosis not present

## 2014-12-14 NOTE — Progress Notes (Signed)
  Subjective:    CC: Follow-up  HPI: Toenail excision: Doing extremely well.  Right elbow primary osteoarthritis: Desires injection, pain is moderate, persistent without radiation, with morning stiffness and he does continue to lack about 5 of extension terminally.  Past medical history, Surgical history, Family history not pertinant except as noted below, Social history, Allergies, and medications have been entered into the medical record, reviewed, and no changes needed.   Review of Systems: No fevers, chills, night sweats, weight loss, chest pain, or shortness of breath.   Objective:    General: Well Developed, well nourished, and in no acute distress.  Neuro: Alert and oriented x3, extra-ocular muscles intact, sensation grossly intact.  HEENT: Normocephalic, atraumatic, pupils equal round reactive to light, neck supple, no masses, no lymphadenopathy, thyroid nonpalpable.  Skin: Warm and dry, no rashes. Cardiac: Regular rate and rhythm, no murmurs rubs or gallops, no lower extremity edema.  Respiratory: Clear to auscultation bilaterally. Not using accessory muscles, speaking in full sentences. Great toe: Visible granulation tissue with no signs of infection.  Procedure: Real-time Ultrasound Guided Injection of right elbow joint Device: GE Logiq E  Verbal informed consent obtained.  Time-out conducted.  Noted no overlying erythema, induration, or other signs of local infection.  Skin prepped in a sterile fashion.  Local anesthesia: Topical Ethyl chloride.  With sterile technique and under real time ultrasound guidance:  I visualized the traversing brachial artery and its recurrent branches, avoiding these, I used a 25-gauge needle, and advanced into the humeroulnar joint, 1 mL kenalog 40, 3 mL lidocaine injected easily. Completed without difficulty  Pain immediately resolved suggesting accurate placement of the medication.  Advised to call if fevers/chills, erythema, induration,  drainage, or persistent bleeding.  Images permanently stored and available for review in the ultrasound unit.  Impression: Technically successful ultrasound guided injection.  Impression and Recommendations:

## 2014-12-14 NOTE — Assessment & Plan Note (Signed)
X-ray does confirm primary osteoarthritis of the right elbow without loose bodies. We avoided injection at a previous visit due to the toenail removal, this is now healing well, elbow injection performed today.

## 2014-12-14 NOTE — Assessment & Plan Note (Signed)
Continues to do extremely well after left great toenail removal. We did not use phenol.

## 2015-01-11 ENCOUNTER — Ambulatory Visit: Payer: BLUE CROSS/BLUE SHIELD | Admitting: Sports Medicine

## 2015-01-11 ENCOUNTER — Encounter: Payer: Self-pay | Admitting: Sports Medicine

## 2015-01-11 ENCOUNTER — Ambulatory Visit (INDEPENDENT_AMBULATORY_CARE_PROVIDER_SITE_OTHER): Payer: BLUE CROSS/BLUE SHIELD | Admitting: Sports Medicine

## 2015-01-11 VITALS — BP 131/81 | HR 80 | Wt 193.0 lb

## 2015-01-11 DIAGNOSIS — K900 Celiac disease: Secondary | ICD-10-CM | POA: Diagnosis not present

## 2015-01-11 DIAGNOSIS — M19021 Primary osteoarthritis, right elbow: Secondary | ICD-10-CM | POA: Diagnosis not present

## 2015-01-11 MED ORDER — DOXYCYCLINE HYCLATE 100 MG PO TABS: 100.0000 mg | ORAL_TABLET | Freq: Two times a day (BID) | ORAL | Status: AC

## 2015-01-11 NOTE — Assessment & Plan Note (Signed)
Symptoms have resolved after intra-articular injection into the elbow.

## 2015-01-11 NOTE — Assessment & Plan Note (Signed)
Possible exposure to gluten. Adding doxycycline to use if rash persists, I do expect it to resolve as exposure has ceased.

## 2015-01-11 NOTE — Progress Notes (Signed)
  Subjective:    CC: Follow-up  HPI: Right elbow osteoarthritis: Completely pain-free now 1 month after injection into the joint.  Onychodystrophy: Doing well. He is post-nail plate excision of the left great toenail without phenol treatment.  Gluten intolerance: Occasionally he and he will get on sensations and rashes on his hands with exposure to gluten, he thinks he did have an accidental exposure, he tells me that his dermatologist has used doxycycline in the past, and desires a prescription.  Past medical history, Surgical history, Family history not pertinant except as noted below, Social history, Allergies, and medications have been entered into the medical record, reviewed, and no changes needed.   Review of Systems: No fevers, chills, night sweats, weight loss, chest pain, or shortness of breath.   Objective:    General: Well Developed, well nourished, and in no acute distress.  Neuro: Alert and oriented x3, extra-ocular muscles intact, sensation grossly intact.  HEENT: Normocephalic, atraumatic, pupils equal round reactive to light, neck supple, no masses, no lymphadenopathy, thyroid nonpalpable.  Skin: Warm and dry, no rashes. Only minimal dryness and flaking on both hands. Cardiac: Regular rate and rhythm, no murmurs rubs or gallops, no lower extremity edema.  Respiratory: Clear to auscultation bilaterally. Not using accessory muscles, speaking in full sentences.  Impression and Recommendations:

## 2015-01-30 ENCOUNTER — Encounter: Payer: Self-pay | Admitting: Sports Medicine

## 2015-02-07 ENCOUNTER — Other Ambulatory Visit: Payer: Self-pay | Admitting: Sports Medicine

## 2015-02-07 DIAGNOSIS — F419 Anxiety disorder, unspecified: Secondary | ICD-10-CM

## 2015-02-09 ENCOUNTER — Other Ambulatory Visit: Payer: Self-pay | Admitting: Sports Medicine

## 2015-02-09 MED ORDER — ALPRAZOLAM 0.5 MG PO TABS: 0.5000 mg | ORAL_TABLET | Freq: Two times a day (BID) | ORAL | Status: DC | PRN

## 2015-02-09 NOTE — Addendum Note (Signed)
Addended by: Narda Rutherford on: 02/09/2015 01:45 PM   Modules accepted: Orders

## 2015-02-23 ENCOUNTER — Encounter: Payer: Self-pay | Admitting: Sports Medicine

## 2015-03-02 ENCOUNTER — Encounter: Payer: Self-pay | Admitting: Sports Medicine

## 2015-03-02 ENCOUNTER — Ambulatory Visit (INDEPENDENT_AMBULATORY_CARE_PROVIDER_SITE_OTHER): Payer: BLUE CROSS/BLUE SHIELD | Admitting: Sports Medicine

## 2015-03-02 VITALS — BP 112/73 | HR 82 | Ht 68.0 in | Wt 192.0 lb

## 2015-03-02 DIAGNOSIS — G5602 Carpal tunnel syndrome, left upper limb: Secondary | ICD-10-CM | POA: Diagnosis not present

## 2015-03-02 DIAGNOSIS — E291 Testicular hypofunction: Secondary | ICD-10-CM

## 2015-03-02 DIAGNOSIS — E785 Hyperlipidemia, unspecified: Secondary | ICD-10-CM

## 2015-03-02 LAB — LIPID PANEL
Cholesterol: 121 mg/dL (ref 0–200)
HDL: 53 mg/dL (ref >=40)
LDL Cholesterol: 57 mg/dL (ref 0–99)
Total CHOL/HDL Ratio: 2.3 Ratio
Triglycerides: 57 mg/dL (ref <150)
VLDL: 11 mg/dL (ref 0–40)

## 2015-03-02 LAB — HEMOGLOBIN A1C
Hgb A1c MFr Bld: 6 % — ABNORMAL HIGH (ref <5.7)
Mean Plasma Glucose: 126 mg/dL — ABNORMAL HIGH (ref <117)

## 2015-03-02 LAB — COMPREHENSIVE METABOLIC PANEL WITH GFR
ALT: 33 U/L (ref 0–53)
AST: 26 U/L (ref 0–37)
Alkaline Phosphatase: 71 U/L (ref 39–117)
BUN: 14 mg/dL (ref 6–23)
Calcium: 9.5 mg/dL (ref 8.4–10.5)
Chloride: 100 meq/L (ref 96–112)
Potassium: 4.7 meq/L (ref 3.5–5.3)
Total Bilirubin: 0.7 mg/dL (ref 0.2–1.2)

## 2015-03-02 LAB — COMPREHENSIVE METABOLIC PANEL
Albumin: 4.4 g/dL (ref 3.5–5.2)
CO2: 28 mEq/L (ref 19–32)
Creat: 0.8 mg/dL (ref 0.50–1.35)
Glucose, Bld: 88 mg/dL (ref 70–99)
Sodium: 140 mEq/L (ref 135–145)
Total Protein: 7.2 g/dL (ref 6.0–8.3)

## 2015-03-02 LAB — CBC
HCT: 45.4 % (ref 39.0–52.0)
Hemoglobin: 15 g/dL (ref 13.0–17.0)
MCH: 27.8 pg (ref 26.0–34.0)
MCHC: 33 g/dL (ref 30.0–36.0)
MCV: 84.1 fL (ref 78.0–100.0)
MPV: 11 fL (ref 8.6–12.4)
Platelets: 258 10*3/uL (ref 150–400)
RBC: 5.4 MIL/uL (ref 4.22–5.81)
RDW: 13.5 % (ref 11.5–15.5)
WBC: 5.2 10*3/uL (ref 4.0–10.5)

## 2015-03-02 LAB — TSH: TSH: 2.531 u[IU]/mL (ref 0.350–4.500)

## 2015-03-02 MED ORDER — ATORVASTATIN CALCIUM 40 MG PO TABS: 40.0000 mg | ORAL_TABLET | Freq: Every day | ORAL | Status: DC

## 2015-03-02 MED ORDER — CREATINE MONOHYDRATE POWD: Status: DC

## 2015-03-02 NOTE — Progress Notes (Signed)
  Subjective:    CC: Follow-up  HPI: Left carpal tunnel syndrome: 5 months post previous injection, desires a repeat. Symptoms are moderate, persistent without radiation.  Hyperlipidemia: Needs blood work drawn for upcoming physical.  Past medical history, Surgical history, Family history not pertinant except as noted below, Social history, Allergies, and medications have been entered into the medical record, reviewed, and no changes needed.   Review of Systems: No fevers, chills, night sweats, weight loss, chest pain, or shortness of breath.   Objective:    General: Well Developed, well nourished, and in no acute distress.  Neuro: Alert and oriented x3, extra-ocular muscles intact, sensation grossly intact.  HEENT: Normocephalic, atraumatic, pupils equal round reactive to light, neck supple, no masses, no lymphadenopathy, thyroid nonpalpable.  Skin: Warm and dry, no rashes. Cardiac: Regular rate and rhythm, no murmurs rubs or gallops, no lower extremity edema.  Respiratory: Clear to auscultation bilaterally. Not using accessory muscles, speaking in full sentences.  Procedure: Real-time Ultrasound Guided Injection of left median nerve hydrodissection Device: GE Logiq E  Verbal informed consent obtained.  Time-out conducted.  Noted no overlying erythema, induration, or other signs of local infection.  Skin prepped in a sterile fashion.  Local anesthesia: Topical Ethyl chloride.  With sterile technique and under real time ultrasound guidance:  I injected medication both superficial to and deep to the median nerve freeing it up from surrounding structures, total of 1 mL kenalog 40, 5 mL lidocaine was used. Completed without difficulty  Pain immediately resolved suggesting accurate placement of the medication.  Advised to call if fevers/chills, erythema, induration, drainage, or persistent bleeding.  Images permanently stored and available for review in the ultrasound unit.  Impression:  Technically successful ultrasound guided injection.  Impression and Recommendations:    I spent 40 minutes with this patient, greater than 50% was face-to-face time counseling regarding the above diagnoses

## 2015-03-02 NOTE — Assessment & Plan Note (Signed)
5 months response to previous left-sided median nerve hydrodissection. Repeated today.

## 2015-03-02 NOTE — Assessment & Plan Note (Addendum)
Checking routine blood work in anticipation of upcoming physical.  Decrease atorvastatin to 20 mg.

## 2015-03-03 LAB — VITAMIN D 25 HYDROXY (VIT D DEFICIENCY, FRACTURES): Vit D, 25-Hydroxy: 65 ng/mL (ref 30–100)

## 2015-03-04 NOTE — Addendum Note (Signed)
Addended by: Silverio Decamp on: 03/04/2015 02:30 PM   Modules accepted: Orders

## 2015-03-06 ENCOUNTER — Ambulatory Visit: Payer: BLUE CROSS/BLUE SHIELD | Admitting: Sports Medicine

## 2015-03-26 ENCOUNTER — Ambulatory Visit: Payer: BLUE CROSS/BLUE SHIELD | Admitting: Sports Medicine

## 2015-03-27 ENCOUNTER — Other Ambulatory Visit: Payer: Self-pay | Admitting: Sports Medicine

## 2015-03-28 ENCOUNTER — Other Ambulatory Visit: Payer: Self-pay

## 2015-03-28 DIAGNOSIS — F419 Anxiety disorder, unspecified: Secondary | ICD-10-CM

## 2015-03-28 MED ORDER — ALPRAZOLAM 0.5 MG PO TABS: 0.5000 mg | ORAL_TABLET | Freq: Two times a day (BID) | ORAL | Status: DC | PRN

## 2015-04-03 ENCOUNTER — Ambulatory Visit: Payer: BLUE CROSS/BLUE SHIELD | Admitting: Sports Medicine

## 2015-04-19 ENCOUNTER — Encounter: Payer: Self-pay | Admitting: Sports Medicine

## 2015-04-19 ENCOUNTER — Ambulatory Visit (INDEPENDENT_AMBULATORY_CARE_PROVIDER_SITE_OTHER): Payer: BLUE CROSS/BLUE SHIELD | Admitting: Sports Medicine

## 2015-04-19 VITALS — BP 118/72 | HR 71 | Wt 187.0 lb

## 2015-04-19 DIAGNOSIS — R51 Headache: Secondary | ICD-10-CM | POA: Diagnosis not present

## 2015-04-19 DIAGNOSIS — W28XXXA Contact with powered lawn mower, initial encounter: Secondary | ICD-10-CM | POA: Diagnosis not present

## 2015-04-19 DIAGNOSIS — R42 Dizziness and giddiness: Secondary | ICD-10-CM | POA: Diagnosis not present

## 2015-04-19 NOTE — Progress Notes (Signed)
  Subjective:    CC: Fall from a lawnmower  HPI: This is a pleasant 39 year old male, yesterday he was driving his lawnmower, he was on an incline, the lawnmower slipped backwards, and he fell off, impacted the occiput on the ground, he had immediate pain, dizziness, fogginess. No nausea. No loss of consciousness. Today he feels persistent dizziness, fogginess, does not feel off balance, he does have a moderate headache. No focal symptoms.  Past medical history, Surgical history, Family history not pertinant except as noted below, Social history, Allergies, and medications have been entered into the medical record, reviewed, and no changes needed.   Review of Systems: No fevers, chills, night sweats, weight loss, chest pain, or shortness of breath.   Objective:    General: Well Developed, well nourished, and in no acute distress.  Neuro: Alert and oriented x3, extra-ocular muscles intact, sensation grossly intact. Cranial nerves II through XII are intact, patient endorses normal smell. Motor, sensory, and coordinative functions are all intact. Negative battle sign, tympanic membranes are unremarkable. Negative Romberg sign. No finger dysmetria or dysdiadochokinesis. HEENT: Normocephalic, atraumatic, pupils equal round reactive to light, neck supple, no masses, no lymphadenopathy, thyroid nonpalpable.  Skin: Warm and dry, no rashes. Cardiac: Regular rate and rhythm, no murmurs rubs or gallops, no lower extremity edema.  Respiratory: Clear to auscultation bilaterally. Not using accessory muscles, speaking in full sentences.  Impression and Recommendations:   I spent 25 minutes with this patient, greater than 50% was face-to-face time counseling regarding the above diagnoses

## 2015-04-19 NOTE — Patient Instructions (Signed)
Concussion  A concussion, or closed-head injury, is a brain injury caused by a direct blow to the head or by a quick and sudden movement (jolt) of the head or neck. Concussions are usually not life-threatening. Even so, the effects of a concussion can be serious. If you have had a concussion before, you are more likely to experience concussion-like symptoms after a direct blow to the head.   CAUSES  · Direct blow to the head, such as from running into another player during a soccer game, being hit in a fight, or hitting your head on a hard surface.  · A jolt of the head or neck that causes the brain to move back and forth inside the skull, such as in a car crash.  SIGNS AND SYMPTOMS  The signs of a concussion can be hard to notice. Early on, they may be missed by you, family members, and health care providers. You may look fine but act or feel differently.  Symptoms are usually temporary, but they may last for days, weeks, or even longer. Some symptoms may appear right away while others may not show up for hours or days. Every head injury is different. Symptoms include:  · Mild to moderate headaches that will not go away.  · A feeling of pressure inside your head.  · Having more trouble than usual:  ¨ Learning or remembering things you have heard.  ¨ Answering questions.  ¨ Paying attention or concentrating.  ¨ Organizing daily tasks.  ¨ Making decisions and solving problems.  · Slowness in thinking, acting or reacting, speaking, or reading.  · Getting lost or being easily confused.  · Feeling tired all the time or lacking energy (fatigued).  · Feeling drowsy.  · Sleep disturbances.  ¨ Sleeping more than usual.  ¨ Sleeping less than usual.  ¨ Trouble falling asleep.  ¨ Trouble sleeping (insomnia).  · Loss of balance or feeling lightheaded or dizzy.  · Nausea or vomiting.  · Numbness or tingling.  · Increased sensitivity to:  ¨ Sounds.  ¨ Lights.  ¨ Distractions.  · Vision problems or eyes that tire  easily.  · Diminished sense of taste or smell.  · Ringing in the ears.  · Mood changes such as feeling sad or anxious.  · Becoming easily irritated or angry for little or no reason.  · Lack of motivation.  · Seeing or hearing things other people do not see or hear (hallucinations).  DIAGNOSIS  Your health care provider can usually diagnose a concussion based on a description of your injury and symptoms. He or she will ask whether you passed out (lost consciousness) and whether you are having trouble remembering events that happened right before and during your injury.  Your evaluation might include:  · A brain scan to look for signs of injury to the brain. Even if the test shows no injury, you may still have a concussion.  · Blood tests to be sure other problems are not present.  TREATMENT  · Concussions are usually treated in an emergency department, in urgent care, or at a clinic. You may need to stay in the hospital overnight for further treatment.  · Tell your health care provider if you are taking any medicines, including prescription medicines, over-the-counter medicines, and natural remedies. Some medicines, such as blood thinners (anticoagulants) and aspirin, may increase the chance of complications. Also tell your health care provider whether you have had alcohol or are taking illegal drugs. This information   may affect treatment.  · Your health care provider will send you home with important instructions to follow.  · How fast you will recover from a concussion depends on many factors. These factors include how severe your concussion is, what part of your brain was injured, your age, and how healthy you were before the concussion.  · Most people with mild injuries recover fully. Recovery can take time. In general, recovery is slower in older persons. Also, persons who have had a concussion in the past or have other medical problems may find that it takes longer to recover from their current injury.  HOME  CARE INSTRUCTIONS  General Instructions  · Carefully follow the directions your health care provider gave you.  · Only take over-the-counter or prescription medicines for pain, discomfort, or fever as directed by your health care provider.  · Take only those medicines that your health care provider has approved.  · Do not drink alcohol until your health care provider says you are well enough to do so. Alcohol and certain other drugs may slow your recovery and can put you at risk of further injury.  · If it is harder than usual to remember things, write them down.  · If you are easily distracted, try to do one thing at a time. For example, do not try to watch TV while fixing dinner.  · Talk with family members or close friends when making important decisions.  · Keep all follow-up appointments. Repeated evaluation of your symptoms is recommended for your recovery.  · Watch your symptoms and tell others to do the same. Complications sometimes occur after a concussion. Older adults with a brain injury may have a higher risk of serious complications, such as a blood clot on the brain.  · Tell your teachers, school nurse, school counselor, coach, athletic trainer, or work manager about your injury, symptoms, and restrictions. Tell them about what you can or cannot do. They should watch for:  ¨ Increased problems with attention or concentration.  ¨ Increased difficulty remembering or learning new information.  ¨ Increased time needed to complete tasks or assignments.  ¨ Increased irritability or decreased ability to cope with stress.  ¨ Increased symptoms.  · Rest. Rest helps the brain to heal. Make sure you:  ¨ Get plenty of sleep at night. Avoid staying up late at night.  ¨ Keep the same bedtime hours on weekends and weekdays.  ¨ Rest during the day. Take daytime naps or rest breaks when you feel tired.  · Limit activities that require a lot of thought or concentration. These include:  ¨ Doing homework or job-related  work.  ¨ Watching TV.  ¨ Working on the computer.  · Avoid any situation where there is potential for another head injury (football, hockey, soccer, basketball, martial arts, downhill snow sports and horseback riding). Your condition will get worse every time you experience a concussion. You should avoid these activities until you are evaluated by the appropriate follow-up health care providers.  Returning To Your Regular Activities  You will need to return to your normal activities slowly, not all at once. You must give your body and brain enough time for recovery.  · Do not return to sports or other athletic activities until your health care provider tells you it is safe to do so.  · Ask your health care provider when you can drive, ride a bicycle, or operate heavy machinery. Your ability to react may be slower after a   brain injury. Never do these activities if you are dizzy.  · Ask your health care provider about when you can return to work or school.  Preventing Another Concussion  It is very important to avoid another brain injury, especially before you have recovered. In rare cases, another injury can lead to permanent brain damage, brain swelling, or death. The risk of this is greatest during the first 7-10 days after a head injury. Avoid injuries by:  · Wearing a seat belt when riding in a car.  · Drinking alcohol only in moderation.  · Wearing a helmet when biking, skiing, skateboarding, skating, or doing similar activities.  · Avoiding activities that could lead to a second concussion, such as contact or recreational sports, until your health care provider says it is okay.  · Taking safety measures in your home.  ¨ Remove clutter and tripping hazards from floors and stairways.  ¨ Use grab bars in bathrooms and handrails by stairs.  ¨ Place non-slip mats on floors and in bathtubs.  ¨ Improve lighting in dim areas.  SEEK MEDICAL CARE IF:  · You have increased problems paying attention or  concentrating.  · You have increased difficulty remembering or learning new information.  · You need more time to complete tasks or assignments than before.  · You have increased irritability or decreased ability to cope with stress.  · You have more symptoms than before.  Seek medical care if you have any of the following symptoms for more than 2 weeks after your injury:  · Lasting (chronic) headaches.  · Dizziness or balance problems.  · Nausea.  · Vision problems.  · Increased sensitivity to noise or light.  · Depression or mood swings.  · Anxiety or irritability.  · Memory problems.  · Difficulty concentrating or paying attention.  · Sleep problems.  · Feeling tired all the time.  SEEK IMMEDIATE MEDICAL CARE IF:  · You have severe or worsening headaches. These may be a sign of a blood clot in the brain.  · You have weakness (even if only in one hand, leg, or part of the face).  · You have numbness.  · You have decreased coordination.  · You vomit repeatedly.  · You have increased sleepiness.  · One pupil is larger than the other.  · You have convulsions.  · You have slurred speech.  · You have increased confusion. This may be a sign of a blood clot in the brain.  · You have increased restlessness, agitation, or irritability.  · You are unable to recognize people or places.  · You have neck pain.  · It is difficult to wake you up.  · You have unusual behavior changes.  · You lose consciousness.  MAKE SURE YOU:  · Understand these instructions.  · Will watch your condition.  · Will get help right away if you are not doing well or get worse.  Document Released: 11/08/2003 Document Revised: 08/23/2013 Document Reviewed: 03/10/2013  [employer]® Patient Information ©2015 [employer]. This information is not intended to replace advice given to you by your health care provider. Make sure you discuss any questions you have with your health care provider.

## 2015-04-19 NOTE — Assessment & Plan Note (Signed)
Ejected from a powered lawnmower yesterday. His head on the ground, now having concussive symptoms, neurologic exam is nonfocal. No CT, no neck imaging needed. We discussed physical and cognitive rest, return to usual activities slowly.

## 2015-05-07 ENCOUNTER — Other Ambulatory Visit: Payer: Self-pay | Admitting: Sports Medicine

## 2015-05-07 DIAGNOSIS — F419 Anxiety disorder, unspecified: Secondary | ICD-10-CM

## 2015-05-08 ENCOUNTER — Other Ambulatory Visit: Payer: Self-pay | Admitting: Sports Medicine

## 2015-05-08 MED ORDER — ALPRAZOLAM 0.5 MG PO TABS: 0.5000 mg | ORAL_TABLET | Freq: Two times a day (BID) | ORAL | Status: DC | PRN

## 2015-07-04 ENCOUNTER — Encounter: Payer: Self-pay | Admitting: Sports Medicine

## 2015-07-04 ENCOUNTER — Other Ambulatory Visit: Payer: Self-pay | Admitting: Sports Medicine

## 2015-07-04 DIAGNOSIS — F419 Anxiety disorder, unspecified: Secondary | ICD-10-CM

## 2015-07-05 MED ORDER — ALPRAZOLAM 0.5 MG PO TABS: 0.5000 mg | ORAL_TABLET | Freq: Two times a day (BID) | ORAL | Status: DC | PRN

## 2015-08-21 ENCOUNTER — Other Ambulatory Visit: Payer: Self-pay | Admitting: Sports Medicine

## 2015-08-24 ENCOUNTER — Telehealth: Payer: Self-pay

## 2015-08-24 DIAGNOSIS — F419 Anxiety disorder, unspecified: Secondary | ICD-10-CM

## 2015-08-24 MED ORDER — ALPRAZOLAM 0.5 MG PO TABS: 0.5000 mg | ORAL_TABLET | Freq: Two times a day (BID) | ORAL | Status: DC | PRN

## 2015-08-24 NOTE — Telephone Encounter (Signed)
Patient request refill for Xanax. Patient advised appointment needed for further refills. Brian Spears,CMA

## 2015-09-08 ENCOUNTER — Other Ambulatory Visit: Payer: Self-pay | Admitting: Sports Medicine

## 2015-09-11 ENCOUNTER — Encounter: Payer: Self-pay | Admitting: Sports Medicine

## 2015-09-17 ENCOUNTER — Ambulatory Visit (INDEPENDENT_AMBULATORY_CARE_PROVIDER_SITE_OTHER): Payer: Self-pay | Admitting: Sports Medicine

## 2015-09-17 ENCOUNTER — Encounter: Payer: Self-pay | Admitting: Sports Medicine

## 2015-09-17 VITALS — BP 135/78 | HR 85 | Temp 97.8°F | Ht 68.0 in | Wt 176.0 lb

## 2015-09-17 DIAGNOSIS — Z23 Encounter for immunization: Secondary | ICD-10-CM

## 2015-09-17 NOTE — Addendum Note (Signed)
Addended by: Laurey Arrow on: 09/17/2015 08:44 AM   Modules accepted: Medications

## 2015-10-03 ENCOUNTER — Encounter: Payer: Self-pay | Admitting: Sports Medicine

## 2015-10-03 ENCOUNTER — Ambulatory Visit (INDEPENDENT_AMBULATORY_CARE_PROVIDER_SITE_OTHER): Payer: Self-pay | Admitting: Sports Medicine

## 2015-10-03 VITALS — BP 117/77 | HR 81 | Temp 98.2°F | Resp 18 | Wt 177.5 lb

## 2015-10-03 DIAGNOSIS — M19021 Primary osteoarthritis, right elbow: Secondary | ICD-10-CM

## 2015-10-03 DIAGNOSIS — K900 Celiac disease: Secondary | ICD-10-CM

## 2015-10-03 DIAGNOSIS — M19022 Primary osteoarthritis, left elbow: Secondary | ICD-10-CM

## 2015-10-03 DIAGNOSIS — E291 Testicular hypofunction: Secondary | ICD-10-CM

## 2015-10-03 DIAGNOSIS — Z0000 Encounter for general adult medical examination without abnormal findings: Secondary | ICD-10-CM

## 2015-10-03 DIAGNOSIS — E785 Hyperlipidemia, unspecified: Secondary | ICD-10-CM

## 2015-10-03 LAB — HEMOGLOBIN A1C
Hgb A1c MFr Bld: 5.7 % — ABNORMAL HIGH (ref <5.7)
Mean Plasma Glucose: 117 mg/dL — ABNORMAL HIGH (ref <117)

## 2015-10-03 LAB — TSH: TSH: 1.887 u[IU]/mL (ref 0.350–4.500)

## 2015-10-03 LAB — CBC
HCT: 42.8 % (ref 39.0–52.0)
Hemoglobin: 14.1 g/dL (ref 13.0–17.0)
MCH: 27.5 pg (ref 26.0–34.0)
MCHC: 32.9 g/dL (ref 30.0–36.0)
MCV: 83.4 fL (ref 78.0–100.0)
MPV: 10.1 fL (ref 8.6–12.4)
Platelets: 244 10*3/uL (ref 150–400)
RBC: 5.13 MIL/uL (ref 4.22–5.81)
RDW: 13.4 % (ref 11.5–15.5)
WBC: 4.8 10*3/uL (ref 4.0–10.5)

## 2015-10-03 LAB — COMPREHENSIVE METABOLIC PANEL WITH GFR
Albumin: 4.3 g/dL (ref 3.6–5.1)
BUN: 18 mg/dL (ref 7–25)
Chloride: 103 mmol/L (ref 98–110)
Total Protein: 7.2 g/dL (ref 6.1–8.1)

## 2015-10-03 LAB — COMPREHENSIVE METABOLIC PANEL
ALT: 22 U/L (ref 9–46)
AST: 21 U/L (ref 10–40)
Alkaline Phosphatase: 57 U/L (ref 40–115)
CO2: 27 mmol/L (ref 20–31)
Calcium: 9.5 mg/dL (ref 8.6–10.3)
Creat: 0.79 mg/dL (ref 0.60–1.35)
Glucose, Bld: 87 mg/dL (ref 65–99)
Potassium: 4.6 mmol/L (ref 3.5–5.3)
Sodium: 138 mmol/L (ref 135–146)
Total Bilirubin: 0.6 mg/dL (ref 0.2–1.2)

## 2015-10-03 LAB — LIPID PANEL
Cholesterol: 135 mg/dL (ref 125–200)
HDL: 59 mg/dL (ref >=40)
LDL Cholesterol: 65 mg/dL (ref <130)
Total CHOL/HDL Ratio: 2.3 Ratio (ref <=5.0)
Triglycerides: 53 mg/dL (ref <150)
VLDL: 11 mg/dL (ref <30)

## 2015-10-03 NOTE — Assessment & Plan Note (Signed)
Doing well with regards to celiac disease. He did have a positive anti-gliadin and IgG. Avoidance of gluten has resulted and 70 pound weight loss, and greater sense of well-being.

## 2015-10-03 NOTE — Assessment & Plan Note (Signed)
Fantastic response to a right elbow injection 7 months ago, persistent relief. Next line having similar symptoms on the left but desires to wait before considering interventional treatment.

## 2015-10-03 NOTE — Assessment & Plan Note (Signed)
Annual physical as above.  

## 2015-10-03 NOTE — Assessment & Plan Note (Signed)
Rechecking labs including lipids. 

## 2015-10-03 NOTE — Progress Notes (Signed)
  Subjective:    CC: Follow-up  HPI: Celiac disease: Continues to do well with good avoidance, has lost 70 pounds.  Elbow pain: Left-sided, good response to a right-sided elbow injection, x-rays did show osteoarthritis, does not desire to proceed with an injection just yet.  Prediabetes: Needs a recheck on blood work.  Hyperlipidemia: Stable on current medications.  Hypogonadism: Did have some polycythemia on testosterone supplementation, does desire to try to restart.  Past medical history, Surgical history, Family history not pertinant except as noted below, Social history, Allergies, and medications have been entered into the medical record, reviewed, and no changes needed.   Review of Systems: No fevers, chills, night sweats, weight loss, chest pain, or shortness of breath.   Objective:    General: Well Developed, well nourished, and in no acute distress.  Neuro: Alert and oriented x3, extra-ocular muscles intact, sensation grossly intact.  HEENT: Normocephalic, atraumatic, pupils equal round reactive to light, neck supple, no masses, no lymphadenopathy, thyroid nonpalpable.  Skin: Warm and dry, no rashes. Cardiac: Regular rate and rhythm, no murmurs rubs or gallops, no lower extremity edema.  Respiratory: Clear to auscultation bilaterally. Not using accessory muscles, speaking in full sentences.  Impression and Recommendations:

## 2015-10-03 NOTE — Assessment & Plan Note (Signed)
Desires to go back on testosterone supplementation, he did develop some polycythemia with every 2 week dosing.

## 2015-10-04 ENCOUNTER — Other Ambulatory Visit: Payer: Self-pay | Admitting: Sports Medicine

## 2015-10-31 ENCOUNTER — Ambulatory Visit (INDEPENDENT_AMBULATORY_CARE_PROVIDER_SITE_OTHER): Payer: BLUE CROSS/BLUE SHIELD | Admitting: Sports Medicine

## 2015-10-31 ENCOUNTER — Encounter: Payer: Self-pay | Admitting: Sports Medicine

## 2015-10-31 VITALS — BP 124/82 | HR 79 | Resp 18 | Wt 181.7 lb

## 2015-10-31 DIAGNOSIS — M19021 Primary osteoarthritis, right elbow: Secondary | ICD-10-CM

## 2015-10-31 DIAGNOSIS — G5602 Carpal tunnel syndrome, left upper limb: Secondary | ICD-10-CM

## 2015-10-31 DIAGNOSIS — M19022 Primary osteoarthritis, left elbow: Secondary | ICD-10-CM | POA: Diagnosis not present

## 2015-10-31 NOTE — Progress Notes (Signed)
  Subjective:    CC: Elbow and hand pain  HPI: Carpal tunnel syndrome: 7 month response to previous left-sided median nerve hydrodissection, desires repeat today.  Bilateral elbow osteoarthritis: Good response to right elbow joint injection months and months ago, desires left elbow injection. Pain is moderate, persistent without radiation.  Past medical history, Surgical history, Family history not pertinant except as noted below, Social history, Allergies, and medications have been entered into the medical record, reviewed, and no changes needed.   Review of Systems: No fevers, chills, night sweats, weight loss, chest pain, or shortness of breath.   Objective:    General: Well Developed, well nourished, and in no acute distress.  Neuro: Alert and oriented x3, extra-ocular muscles intact, sensation grossly intact.  HEENT: Normocephalic, atraumatic, pupils equal round reactive to light, neck supple, no masses, no lymphadenopathy, thyroid nonpalpable.  Skin: Warm and dry, no rashes. Cardiac: Regular rate and rhythm, no murmurs rubs or gallops, no lower extremity edema.  Respiratory: Clear to auscultation bilaterally. Not using accessory muscles, speaking in full sentences.  Procedure: Real-time Ultrasound Guided left median nerve hydrodissection Device: GE Logiq E  Verbal informed consent obtained.  Time-out conducted.  Noted no overlying erythema, induration, or other signs of local infection.  Skin prepped in a sterile fashion.  Local anesthesia: Topical Ethyl chloride.  With sterile technique and under real time ultrasound guidance:  Taking care to avoid intraneural injection I was able to surround the median nerve in the carpal tunnel with a total of 1 mL kenalog 40, 5 mL lidocaine, so medication was redirected deep into the carpal tunnel around the flexors Completed without difficulty  Pain immediately resolved suggesting accurate placement of the medication.  Advised to call if  fevers/chills, erythema, induration, drainage, or persistent bleeding.  Images permanently stored and available for review in the ultrasound unit.  Impression: Technically successful ultrasound guided injection.  Procedure: Real-time Ultrasound Guided Injection of left elbow joint Device: GE Logiq E  Verbal informed consent obtained.  Time-out conducted.  Noted no overlying erythema, induration, or other signs of local infection.  Skin prepped in a sterile fashion.  Local anesthesia: Topical Ethyl chloride.  With sterile technique and under real time ultrasound guidance:  From a posterior lateral approach I pierced the anconeus muscle and entered the elbow joint, 1 mL kenalog 40, 1 mL lidocaine, 1 mL Marcaine injected easily. Completed without difficulty  Pain immediately resolved suggesting accurate placement of the medication.  Advised to call if fevers/chills, erythema, induration, drainage, or persistent bleeding.  Images permanently stored and available for review in the ultrasound unit.  Impression: Technically successful ultrasound guided injection.  Impression and Recommendations:    I spent 25 minutes with this patient, greater than 50% was face-to-face time counseling regarding the above diagnoses, this was separate from the time spent performing the procedures

## 2015-10-31 NOTE — Assessment & Plan Note (Signed)
7 month response to previous left-sided median nerve hydrodissection, repeated today.

## 2015-10-31 NOTE — Assessment & Plan Note (Signed)
Fantastic response to right elbow injection, left elbow joint injection as above.

## 2015-11-06 ENCOUNTER — Other Ambulatory Visit: Payer: Self-pay | Admitting: Sports Medicine

## 2015-11-06 DIAGNOSIS — F419 Anxiety disorder, unspecified: Secondary | ICD-10-CM

## 2015-11-08 MED ORDER — ALPRAZOLAM 0.5 MG PO TABS: 0.5000 mg | ORAL_TABLET | Freq: Two times a day (BID) | ORAL | Status: DC | PRN

## 2015-11-08 NOTE — Addendum Note (Signed)
Addended by: Silverio Decamp on: 11/08/2015 09:37 AM   Modules accepted: Orders

## 2015-11-13 ENCOUNTER — Encounter: Payer: Self-pay | Admitting: Sports Medicine

## 2015-12-05 DIAGNOSIS — M9902 Segmental and somatic dysfunction of thoracic region: Secondary | ICD-10-CM | POA: Diagnosis not present

## 2015-12-05 DIAGNOSIS — M542 Cervicalgia: Secondary | ICD-10-CM | POA: Diagnosis not present

## 2015-12-05 DIAGNOSIS — M9901 Segmental and somatic dysfunction of cervical region: Secondary | ICD-10-CM | POA: Diagnosis not present

## 2015-12-05 DIAGNOSIS — M9905 Segmental and somatic dysfunction of pelvic region: Secondary | ICD-10-CM | POA: Diagnosis not present

## 2015-12-05 DIAGNOSIS — M546 Pain in thoracic spine: Secondary | ICD-10-CM | POA: Diagnosis not present

## 2015-12-05 DIAGNOSIS — M9903 Segmental and somatic dysfunction of lumbar region: Secondary | ICD-10-CM | POA: Diagnosis not present

## 2015-12-12 DIAGNOSIS — M542 Cervicalgia: Secondary | ICD-10-CM | POA: Diagnosis not present

## 2015-12-12 DIAGNOSIS — M546 Pain in thoracic spine: Secondary | ICD-10-CM | POA: Diagnosis not present

## 2015-12-12 DIAGNOSIS — M9901 Segmental and somatic dysfunction of cervical region: Secondary | ICD-10-CM | POA: Diagnosis not present

## 2015-12-12 DIAGNOSIS — M9902 Segmental and somatic dysfunction of thoracic region: Secondary | ICD-10-CM | POA: Diagnosis not present

## 2015-12-19 DIAGNOSIS — M9901 Segmental and somatic dysfunction of cervical region: Secondary | ICD-10-CM | POA: Diagnosis not present

## 2015-12-19 DIAGNOSIS — M9902 Segmental and somatic dysfunction of thoracic region: Secondary | ICD-10-CM | POA: Diagnosis not present

## 2015-12-19 DIAGNOSIS — M546 Pain in thoracic spine: Secondary | ICD-10-CM | POA: Diagnosis not present

## 2015-12-19 DIAGNOSIS — M542 Cervicalgia: Secondary | ICD-10-CM | POA: Diagnosis not present

## 2016-01-02 DIAGNOSIS — M542 Cervicalgia: Secondary | ICD-10-CM | POA: Diagnosis not present

## 2016-01-02 DIAGNOSIS — M9901 Segmental and somatic dysfunction of cervical region: Secondary | ICD-10-CM | POA: Diagnosis not present

## 2016-01-02 DIAGNOSIS — M9902 Segmental and somatic dysfunction of thoracic region: Secondary | ICD-10-CM | POA: Diagnosis not present

## 2016-01-02 DIAGNOSIS — M545 Low back pain: Secondary | ICD-10-CM | POA: Diagnosis not present

## 2016-01-02 DIAGNOSIS — M9903 Segmental and somatic dysfunction of lumbar region: Secondary | ICD-10-CM | POA: Diagnosis not present

## 2016-01-08 ENCOUNTER — Other Ambulatory Visit: Payer: Self-pay | Admitting: Sports Medicine

## 2016-01-08 DIAGNOSIS — F419 Anxiety disorder, unspecified: Secondary | ICD-10-CM

## 2016-01-08 MED ORDER — ALPRAZOLAM 0.5 MG PO TABS: 0.5000 mg | ORAL_TABLET | Freq: Two times a day (BID) | ORAL | Status: DC | PRN

## 2016-01-16 DIAGNOSIS — M9903 Segmental and somatic dysfunction of lumbar region: Secondary | ICD-10-CM | POA: Diagnosis not present

## 2016-01-16 DIAGNOSIS — M542 Cervicalgia: Secondary | ICD-10-CM | POA: Diagnosis not present

## 2016-01-16 DIAGNOSIS — M546 Pain in thoracic spine: Secondary | ICD-10-CM | POA: Diagnosis not present

## 2016-01-16 DIAGNOSIS — M545 Low back pain: Secondary | ICD-10-CM | POA: Diagnosis not present

## 2016-01-16 DIAGNOSIS — M9901 Segmental and somatic dysfunction of cervical region: Secondary | ICD-10-CM | POA: Diagnosis not present

## 2016-01-16 DIAGNOSIS — M9902 Segmental and somatic dysfunction of thoracic region: Secondary | ICD-10-CM | POA: Diagnosis not present

## 2016-01-21 ENCOUNTER — Encounter: Payer: Self-pay | Admitting: Sports Medicine

## 2016-01-21 ENCOUNTER — Ambulatory Visit (INDEPENDENT_AMBULATORY_CARE_PROVIDER_SITE_OTHER): Payer: BLUE CROSS/BLUE SHIELD | Admitting: Sports Medicine

## 2016-01-21 VITALS — BP 116/77 | HR 92 | Resp 16 | Wt 180.1 lb

## 2016-01-21 DIAGNOSIS — M19021 Primary osteoarthritis, right elbow: Secondary | ICD-10-CM | POA: Diagnosis not present

## 2016-01-21 DIAGNOSIS — M19022 Primary osteoarthritis, left elbow: Secondary | ICD-10-CM | POA: Diagnosis not present

## 2016-01-21 DIAGNOSIS — N5312 Painful ejaculation: Secondary | ICD-10-CM | POA: Diagnosis not present

## 2016-01-21 LAB — CBC WITH DIFFERENTIAL/PLATELET
Basophils Absolute: 78 cells/uL (ref 0–200)
Basophils Relative: 1 %
Eosinophils Absolute: 234 cells/uL (ref 15–500)
Eosinophils Relative: 3 %
HCT: 42.8 % (ref 38.5–50.0)
Hemoglobin: 13.9 g/dL (ref 13.2–17.1)
Lymphocytes Relative: 25 %
Lymphs Abs: 1950 {cells}/uL (ref 850–3900)
MCH: 26.8 pg — ABNORMAL LOW (ref 27.0–33.0)
MCHC: 32.5 g/dL (ref 32.0–36.0)
MCV: 82.5 fL (ref 80.0–100.0)
MPV: 10.3 fL (ref 7.5–12.5)
Monocytes Absolute: 624 cells/uL (ref 200–950)
Monocytes Relative: 8 %
Neutro Abs: 4914 {cells}/uL (ref 1500–7800)
Neutrophils Relative %: 63 %
Platelets: 287 K/uL (ref 140–400)
RBC: 5.19 MIL/uL (ref 4.20–5.80)
RDW: 13.7 % (ref 11.0–15.0)
WBC: 7.8 K/uL (ref 3.8–10.8)

## 2016-01-21 LAB — URINALYSIS
Bilirubin Urine: NEGATIVE
Glucose, UA: NEGATIVE
Hgb urine dipstick: NEGATIVE
Ketones, ur: NEGATIVE
Leukocytes, UA: NEGATIVE
Nitrite: NEGATIVE
Protein, ur: NEGATIVE
Specific Gravity, Urine: 1.027 (ref 1.001–1.035)
pH: 5 (ref 5.0–8.0)

## 2016-01-21 NOTE — Assessment & Plan Note (Signed)
Good response to previous left elbow injection, right elbow joint injection as above, he did have a bit of medial epicondylitis, we can inject the common flexor tendon origin and future visit if insufficient response to this injection.

## 2016-01-21 NOTE — Assessment & Plan Note (Signed)
Question prostatitis versus seminal vesiculitis. Ejaculate we'll get appear darker per patient. Checking a PSA, referral to urology, he will probably need an ultrasound.

## 2016-01-21 NOTE — Progress Notes (Signed)
  Subjective:    CC: Right elbow pain  HPI: This is a pleasant 40 year old male with known elbow osteoarthritis, his left elbow was injected recently, his right elbow has been injected for many months, he is having recurrence of pain that he localizes along the medial epicondyle, desires a repeat injection today.  Painful ejaculation: Has pain with ejaculation that he localizes in his perineum, he also notes some darkening of the color of the ejaculate.  Past medical history, Surgical history, Family history not pertinant except as noted below, Social history, Allergies, and medications have been entered into the medical record, reviewed, and no changes needed.   Review of Systems: No fevers, chills, night sweats, weight loss, chest pain, or shortness of breath.   Objective:    General: Well Developed, well nourished, and in no acute distress.  Neuro: Alert and oriented x3, extra-ocular muscles intact, sensation grossly intact.  HEENT: Normocephalic, atraumatic, pupils equal round reactive to light, neck supple, no masses, no lymphadenopathy, thyroid nonpalpable.  Skin: Warm and dry, no rashes. Cardiac: Regular rate and rhythm, no murmurs rubs or gallops, no lower extremity edema.  Respiratory: Clear to auscultation bilaterally. Not using accessory muscles, speaking in full sentences. Rectal: Smooth prostate that is nontender, seminal vesicles are nonpalpable and nontender, tone is good. No visible hemorrhoids. Right Elbow: Unremarkable to inspection. Range of motion full pronation, supination, flexion, extension. Strength is full to all of the above directions Stable to varus, valgus stress. Negative moving valgus stress test. Tender to palpation at the common flexor tendon origin Ulnar nerve does not sublux. Negative cubital tunnel Tinel's.  Procedure: Real-time Ultrasound Guided Injection of right elbow joint Device: GE Logiq E  Verbal informed consent obtained.  Time-out  conducted.  Noted no overlying erythema, induration, or other signs of local infection.  Skin prepped in a sterile fashion.  Local anesthesia: Topical Ethyl chloride.  With sterile technique and under real time ultrasound guidance:  25-gauge needle advanced through the anconeus muscle into the elbow joint and 1 mL lidocaine, 1 mL Marcaine, 1 mL kenalog 40 injected easily. Completed without difficulty  Pain immediately resolved suggesting accurate placement of the medication.  Advised to call if fevers/chills, erythema, induration, drainage, or persistent bleeding.  Images permanently stored and available for review in the ultrasound unit.  Impression: Technically successful ultrasound guided injection.  Impression and Recommendations:

## 2016-01-22 ENCOUNTER — Encounter: Payer: Self-pay | Admitting: Sports Medicine

## 2016-01-22 LAB — PSA, TOTAL AND FREE
PSA, Free Pct: 45 % (ref >25)
PSA, Free: 0.25 ng/mL
PSA: 0.55 ng/mL (ref <=4.00)

## 2016-01-23 ENCOUNTER — Telehealth: Payer: Self-pay | Admitting: Sports Medicine

## 2016-01-23 DIAGNOSIS — M9903 Segmental and somatic dysfunction of lumbar region: Secondary | ICD-10-CM | POA: Diagnosis not present

## 2016-01-23 DIAGNOSIS — M9902 Segmental and somatic dysfunction of thoracic region: Secondary | ICD-10-CM | POA: Diagnosis not present

## 2016-01-23 DIAGNOSIS — M546 Pain in thoracic spine: Secondary | ICD-10-CM | POA: Diagnosis not present

## 2016-01-23 DIAGNOSIS — M542 Cervicalgia: Secondary | ICD-10-CM | POA: Diagnosis not present

## 2016-01-23 DIAGNOSIS — M545 Low back pain: Secondary | ICD-10-CM | POA: Diagnosis not present

## 2016-01-23 DIAGNOSIS — M9901 Segmental and somatic dysfunction of cervical region: Secondary | ICD-10-CM | POA: Diagnosis not present

## 2016-01-23 DIAGNOSIS — N5312 Painful ejaculation: Secondary | ICD-10-CM

## 2016-01-23 LAB — URINE CULTURE
Colony Count: NO GROWTH
Organism ID, Bacteria: NO GROWTH

## 2016-01-23 NOTE — Telephone Encounter (Signed)
Swoops, referral placed.

## 2016-01-23 NOTE — Telephone Encounter (Signed)
Pt stated you were going to refer him to Urologist but there is no referral in there for him . Thanks

## 2016-01-30 DIAGNOSIS — M9902 Segmental and somatic dysfunction of thoracic region: Secondary | ICD-10-CM | POA: Diagnosis not present

## 2016-01-30 DIAGNOSIS — M546 Pain in thoracic spine: Secondary | ICD-10-CM | POA: Diagnosis not present

## 2016-01-30 DIAGNOSIS — M545 Low back pain: Secondary | ICD-10-CM | POA: Diagnosis not present

## 2016-01-30 DIAGNOSIS — M542 Cervicalgia: Secondary | ICD-10-CM | POA: Diagnosis not present

## 2016-01-30 DIAGNOSIS — M9901 Segmental and somatic dysfunction of cervical region: Secondary | ICD-10-CM | POA: Diagnosis not present

## 2016-01-30 DIAGNOSIS — M9903 Segmental and somatic dysfunction of lumbar region: Secondary | ICD-10-CM | POA: Diagnosis not present

## 2016-02-07 ENCOUNTER — Encounter: Payer: Self-pay | Admitting: Sports Medicine

## 2016-02-18 ENCOUNTER — Encounter: Payer: Self-pay | Admitting: Family Medicine

## 2016-02-18 ENCOUNTER — Ambulatory Visit (INDEPENDENT_AMBULATORY_CARE_PROVIDER_SITE_OTHER): Payer: BLUE CROSS/BLUE SHIELD | Admitting: Family Medicine

## 2016-02-18 VITALS — BP 142/76 | HR 71 | Wt 180.0 lb

## 2016-02-18 DIAGNOSIS — M7701 Medial epicondylitis, right elbow: Secondary | ICD-10-CM

## 2016-02-18 DIAGNOSIS — G5602 Carpal tunnel syndrome, left upper limb: Secondary | ICD-10-CM

## 2016-02-18 NOTE — Progress Notes (Signed)
Brian Spears is a 40 y.o. male who presents to [facility] today for left hand carpal tunnel syndrome and right medial elbow pain.   1) Left hand numbness: She has been dealing with carpal tunnel syndrome on the left hand intermittently off and on for months now. He's had an injection which helped until recently. He notes numbness into the thumb and index middle and the radial side of the ring finger on the left hand. He denies any weakness fevers or chills. He would like a repeat injection possible.  2) right medial elbow pain: Patient has noted right medial elbow pain off and on for months as well. Symptoms are worse with weight lifting and better with rest. He denies significant radiating pain weakness or numbness or injury.   Past Medical History  Diagnosis Date  . Hand fracture   . Normal cardiac stress test 4/10    at [facility] - negative.  terminated for fatigue  . Hyperlipidemia   . Lyme disease    Past Surgical History  Procedure Laterality Date  . Surgery on right hand     Social History  Substance Use Topics  . Smoking status: Former Smoker -- 0.20 packs/day    Types: Cigarettes  . Smokeless tobacco: Former Systems developer    Types: Snuff    Quit date: 06/02/2011     Comment: started smoking at age 64, started smokeless tobacco at age 41  . Alcohol Use: No   family history includes Breast cancer in his mother and other; Colon cancer in his other; Diabetes in his other; Heart attack in his other; Hyperlipidemia in his father and mother; Hypertension in his father; Lung cancer in his other; Melanoma in his other; Other in his other; Prostate cancer in his other.  ROS:  No headache, visual changes, nausea, vomiting, diarrhea, constipation, dizziness, abdominal pain, skin rash, fevers, chills, night sweats, weight loss, swollen lymph nodes, body aches, joint swelling, muscle aches, chest pain, shortness of breath, mood changes, visual or auditory  hallucinations.    Medications: Current Outpatient Prescriptions  Medication Sig Dispense Refill  . atorvastatin (LIPITOR) 20 MG tablet Take 20 mg by mouth daily.  1  . Probiotic Product (PROBIOTIC FORMULA PO) Take 2 tablets by mouth 2 (two) times daily. Reported on 10/31/2015    . ALPRAZolam (XANAX) 0.5 MG tablet Take 1 tablet (0.5 mg total) by mouth 2 (two) times daily as needed for anxiety. (Patient not taking: Reported on 02/18/2016) 30 tablet 0   No current facility-administered medications for this visit.   Allergies  Allergen Reactions  . Sulfa Antibiotics Other (See Comments)    headaches  . Naproxen Sodium      Exam:  BP 142/76 mmHg  Pulse 71  Wt 180 lb (81.647 kg) General: Well Developed, well nourished, and in no acute distress.  Neuro/Psych: Alert and oriented x3, extra-ocular muscles intact, able to move all 4 extremities, sensation grossly intact. Skin: Warm and dry, no rashes noted.  Respiratory: Not using accessory muscles, speaking in full sentences, trachea midline.  Cardiovascular: Pulses palpable, no extremity edema. Abdomen: Does not appear distended. MSK:  Left wrist normal-appearing nontender positive Tinel's Right elbow normal-appearing. Tender palpation medial epicondyle. Pain with resisted wrist flexion.  Procedure: Real-time Ultrasound Guided Injection of Right Medial Epicondyl  Device: GE Logiq E  Images permanently stored and available for review in the ultrasound unit. Verbal informed consent obtained. Discussed risks and benefits of procedure. Warned about infection  bleeding damage to structures skin hypopigmentation and fat atrophy among others. Patient expresses understanding and agreement Time-out conducted.  Noted no overlying erythema, induration, or other signs of local infection.  Skin prepped in a sterile fashion.  Local anesthesia: Topical Ethyl chloride.  With sterile technique and under real time ultrasound guidance: 40 mg of  Kenalog and 2 mL of lidocaine injected easily.  Completed without difficulty  Pain immediately resolved suggesting accurate placement of the medication.  Advised to call if fevers/chills, erythema, induration, drainage, or persistent bleeding.  Images permanently stored and available for review in the ultrasound unit.  Impression: Technically successful ultrasound guided injection.   Procedure: Real-time Ultrasound Guided hydrodissection of the left median nerve at the carpal tunnel Device: GE Logiq E  Images permanently stored and available for review in the ultrasound unit. Verbal informed consent obtained. Discussed risks and benefits of procedure. Warned about infection bleeding damage to structures skin hypopigmentation and fat atrophy among others. Patient expresses understanding and agreement Time-out conducted.  Noted no overlying erythema, induration, or other signs of local infection.  Skin prepped in a sterile fashion.  Local anesthesia: Topical Ethyl chloride.  With sterile technique and under real time ultrasound guidance: 40 mg of Kenalog and 3 mL of lidocaine injected easily.  Completed without difficulty   Advised to call if fevers/chills, erythema, induration, drainage, or persistent bleeding.  Images permanently stored and available for review in the ultrasound unit.  Impression: Technically successful ultrasound guided injection.    No results found for this or any previous visit (from the past 24 hour(s)). No results found.   Assessment and Plan: 40 y.o. male with  1) right medial epicondylitis. Treat with steroid injection and home exercise program. Follow-up with PCP as needed. 2) left carpal tunnel syndrome: Becoming persistent. Trial of repeat injection. If not improved would suggest consideration of nerve conduction study for surgical planning   CC: Aundria Mems, MD

## 2016-02-18 NOTE — Patient Instructions (Signed)
Thank you for coming in today. Call or go to the ER if you develop a large red swollen joint with extreme pain or oozing puss.  Do the exercise like we discussed.  Return to Dr T if not better.   Carpal Tunnel Syndrome Carpal tunnel syndrome is a condition that causes pain in your hand and arm. The carpal tunnel is a narrow area located on the palm side of your wrist. Repeated wrist motion or certain diseases may cause swelling within the tunnel. This swelling pinches the main nerve in the wrist (median nerve). CAUSES  This condition may be caused by:   Repeated wrist motions.  Wrist injuries.  Arthritis.  A cyst or tumor in the carpal tunnel.  Fluid buildup during pregnancy. Sometimes the cause of this condition is not known.  RISK FACTORS This condition is more likely to develop in:   People who have jobs that cause them to repeatedly move their wrists in the same motion, such as butchers and cashiers.  Women.  People with certain conditions, such as:  Diabetes.  Obesity.  An underactive thyroid (hypothyroidism).  Kidney failure. SYMPTOMS  Symptoms of this condition include:   A tingling feeling in your fingers, especially in your thumb, index, and middle fingers.  Tingling or numbness in your hand.  An aching feeling in your entire arm, especially when your wrist and elbow are bent for long periods of time.  Wrist pain that goes up your arm to your shoulder.  Pain that goes down into your palm or fingers.  A weak feeling in your hands. You may have trouble grabbing and holding items. Your symptoms may feel worse during the night.  DIAGNOSIS  This condition is diagnosed with a medical history and physical exam. You may also have tests, including:   An electromyogram (EMG). This test measures electrical signals sent by your nerves into the muscles.  X-rays. TREATMENT  Treatment for this condition includes:  Lifestyle changes. It is important to stop doing  or modify the activity that caused your condition.  Physical or occupational therapy.  Medicines for pain and inflammation. This may include medicine that is injected into your wrist.  A wrist splint.  Surgery. HOME CARE INSTRUCTIONS  If You Have a Splint:  Wear it as told by your health care provider. Remove it only as told by your health care provider.  Loosen the splint if your fingers become numb and tingle, or if they turn cold and blue.  Keep the splint clean and dry. General Instructions  Take over-the-counter and prescription medicines only as told by your health care provider.  Rest your wrist from any activity that may be causing your pain. If your condition is work related, talk to your employer about changes that can be made, such as getting a wrist pad to use while typing.  If directed, apply ice to the painful area:  Put ice in a plastic bag.  Place a towel between your skin and the bag.  Leave the ice on for 20 minutes, 2-3 times per day.  Keep all follow-up visits as told by your health care provider. This is important.  Do any exercises as told by your health care provider, physical therapist, or occupational therapist. [place] IF:   You have new symptoms.  Your pain is not controlled with medicines.  Your symptoms get worse.   This information is not intended to replace advice given to you by your health care provider. Make  sure you discuss any questions you have with your health care provider.   Document Released: 08/15/2000 Document Revised: 05/09/2015 Document Reviewed: 01/03/2015 Elsevier Interactive Patient Education 2016 [employer].   Medial Epicondylitis With Rehab Medial epicondylitis involves inflammation and pain around the inner (medial) portion of the elbow. This pain is caused by inflammation of the tendons in the forearm that flex (bring down) the wrist. Medial epicondylitis is also called golfer's elbow, because it is  common among golfers. However, it may occur in any individual who flexes the wrist regularly. If medial epicondylitis is left untreated, it may become a chronic problem. SYMPTOMS   Pain, tenderness, or inflammation over the inner (medial) side of the elbow.  Pain or weakness with gripping activities.  Pain that increases with wrist twisting motions (using a screwdriver, playing golf, bowling). CAUSES  Medial epicondylitis is caused by inflammation of the tendons that flex the wrist. Causes of injury may include:  Chronic, repetitive stress and strain to the tendons that run from the wrist and forearm to the elbow.  Sudden strain on the forearm, including wrist snap when serving balls with racquet sports, or throwing a baseball. RISK INCREASES WITH:  Sports or occupations that require repetitive and/or strenuous forearm and wrist movements (pitching a baseball, golfing, carpentry).  Poor wrist and forearm strength and flexibility.  Failure to warm up properly before activity.  Resuming activity before healing, rehabilitation, and conditioning are complete. PREVENTION   Warm up and stretch properly before activity.  Maintain physical fitness:  Strength, flexibility, and endurance.  Cardiovascular fitness.  Wear and use properly fitted equipment.  Learn and use proper technique and have a coach correct improper technique.  Wear a tennis elbow (counterforce) brace. PROGNOSIS  The course of this condition depends on the degree of the injury. If treated properly, acute cases (symptoms lasting less than 4 weeks) are often resolved in 2 to 6 weeks. Chronic (longer lasting cases) often resolve in 3 to 6 months, but may require physical therapy. RELATED COMPLICATIONS   Frequently recurring symptoms, resulting in a chronic problem. Properly treating the problem the first time decreases frequency of recurrence.  Chronic inflammation, scarring, and partial tendon tear, requiring  surgery.  Delayed healing or resolution of symptoms. TREATMENT  Treatment first involves the use of ice and medicine, to reduce pain and inflammation. Strengthening and stretching exercises may reduce discomfort, if performed regularly. These exercises may be performed at home, if the condition is an acute injury. Chronic cases may require a referral to a physical therapist for evaluation and treatment. Your caregiver may advise a corticosteroid injection to help reduce inflammation. Rarely, surgery is needed. MEDICATION  If pain medicine is needed, nonsteroidal anti-inflammatory medicines (aspirin and ibuprofen), or other minor pain relievers (acetaminophen), are often advised.  Do not take pain medicine for 7 days before surgery.  Prescription pain relievers may be given, if your caregiver thinks they are needed. Use only as directed and only as much as you need.  Corticosteroid injections may be recommended. These injections should be reserved only for the most severe cases, because they can only be given a certain number of times. HEAT AND COLD  Cold treatment (icing) should be applied for 10 to 15 minutes every 2 to 3 hours for inflammation and pain, and immediately after activity that aggravates your symptoms. Use ice packs or an ice massage.  Heat treatment may be used before performing stretching and strengthening activities prescribed by your caregiver, physical therapist, or athletic  trainer. Use a heat pack or a warm water soak. SEEK MEDICAL CARE IF: Symptoms get worse or do not improve in 2 weeks, despite treatment. EXERCISES  RANGE OF MOTION (ROM) AND STRETCHING EXERCISES - Epicondylitis, Medial (Golfer's Elbow) These exercises may help you when beginning to rehabilitate your injury. Your symptoms may go away with or without further involvement from your physician, physical therapist or athletic trainer. While completing these exercises, remember:   Restoring tissue flexibility  helps normal motion to return to the joints. This allows healthier, less painful movement and activity.  An effective stretch should be held for at least 30 seconds.  A stretch should never be painful. You should only feel a gentle lengthening or release in the stretched tissue. RANGE OF MOTION - Wrist Flexion, Active-Assisted  Extend your right / left elbow with your fingers pointing down.*  Gently pull the back of your hand towards you, until you feel a gentle stretch on the top of your forearm.  Hold this position for __________ seconds. Repeat __________ times. Complete this exercise __________ times per day.  *If directed by your physician, physical therapist or athletic trainer, complete this stretch with your elbow bent, rather than extended. RANGE OF MOTION - Wrist Extension, Active-Assisted  Extend your right / left elbow and turn your palm upwards.*  Gently pull your palm and fingertips back, so your wrist extends and your fingers point more toward the ground.  You should feel a gentle stretch on the inside of your forearm.  Hold this position for __________ seconds. Repeat __________ times. Complete this exercise __________ times per day. *If directed by your physician, physical therapist or athletic trainer, complete this stretch with your elbow bent, rather than extended. STRETCH - Wrist Extension   Place your right / left fingertips on a tabletop leaving your elbow slightly bent. Your fingers should point backwards.  Gently press your fingers and palm down onto the table, by straightening your elbow. You should feel a stretch on the inside of your forearm.  Hold this position for __________ seconds. Repeat __________ times. Complete this stretch __________ times per day.  STRENGTHENING EXERCISES - Epicondylitis, Medial (Golfer's Elbow) These exercises may help you when beginning to rehabilitate your injury. They may resolve your symptoms with or without further  involvement from your physician, physical therapist or athletic trainer. While completing these exercises, remember:   Muscles can gain both the endurance and the strength needed for everyday activities through controlled exercises.  Complete these exercises as instructed by your physician, physical therapist or athletic trainer. Increase the resistance and repetitions only as guided.  You may experience muscle soreness or fatigue, but the pain or discomfort you are trying to eliminate should never worsen during these exercises. If this pain does get worse, stop and make sure you are following the directions exactly. If the pain is still present after adjustments, discontinue the exercise until you can discuss the trouble with your caregiver. STRENGTH - Wrist Flexors  Sit with your right / left forearm palm-up, and fully supported on a table or countertop. Your elbow should be resting below the height of your shoulder. Allow your wrist to extend over the edge of the surface.  Loosely holding a __________ weight, or a piece of rubber exercise band or tubing, slowly curl your hand up toward your forearm.  Hold this position for __________ seconds. Slowly lower the wrist back to the starting position in a controlled manner. Repeat __________ times. Complete this exercise __________  times per day.  STRENGTH - Wrist Extensors  Sit with your right / left forearm palm-down and fully supported. Your elbow should be resting below the height of your shoulder. Allow your wrist to extend over the edge of the surface.  Loosely holding a __________ weight, or a piece of rubber exercise band or tubing, slowly curl your hand up toward your forearm.  Hold this position for __________ seconds. Slowly lower the wrist back to the starting position in a controlled manner. Repeat __________ times. Complete this exercise __________ times per day.  STRENGTH - Ulnar Deviators  Stand with a ____________________  weight in your right / left hand, or sit while holding a rubber exercise band or tubing, with your healthy arm supported on a table or countertop.  Move your wrist so that your pinkie travels toward your forearm and your thumb moves away from your forearm.  Hold this position for __________ seconds and then slowly lower the wrist back to the starting position. Repeat __________ times. Complete this exercise __________ times per day STRENGTH - Grip   Grasp a tennis ball, a dense sponge, or a large, rolled sock in your hand.  Squeeze as hard as you can, without increasing any pain.  Hold this position for __________ seconds. Release your grip slowly. Repeat __________ times. Complete this exercise __________ times per day.  STRENGTH - Forearm Supinators   Sit with your right / left forearm supported on a table, keeping your elbow below shoulder height. Rest your hand over the edge, palm down.  Gently grip a hammer or a soup ladle.  Without moving your elbow, slowly turn your palm and hand upward to a "thumbs-up" position.  Hold this position for __________ seconds. Slowly return to the starting position. Repeat __________ times. Complete this exercise __________ times per day.  STRENGTH - Forearm Pronators  Sit with your right / left forearm supported on a table, keeping your elbow below shoulder height. Rest your hand over the edge, palm up.  Gently grip a hammer or a soup ladle.  Without moving your elbow, slowly turn your palm and hand upward to a "thumbs-up" position.  Hold this position for __________ seconds. Slowly return to the starting position. Repeat __________ times. Complete this exercise __________ times per day.    This information is not intended to replace advice given to you by your health care provider. Make sure you discuss any questions you have with your health care provider.   Document Released: 08/18/2005 Document Revised: 09/08/2014 Document Reviewed:  11/30/2008 Elsevier Interactive Patient Education [employer].

## 2016-03-10 ENCOUNTER — Other Ambulatory Visit: Payer: Self-pay | Admitting: Sports Medicine

## 2016-03-14 ENCOUNTER — Encounter: Payer: Self-pay | Admitting: Sports Medicine

## 2016-03-17 DIAGNOSIS — K594 Anal spasm: Secondary | ICD-10-CM | POA: Diagnosis not present

## 2016-03-20 ENCOUNTER — Encounter: Payer: Self-pay | Admitting: Sports Medicine

## 2016-03-20 DIAGNOSIS — E785 Hyperlipidemia, unspecified: Secondary | ICD-10-CM

## 2016-05-14 DIAGNOSIS — M545 Low back pain: Secondary | ICD-10-CM | POA: Diagnosis not present

## 2016-05-14 DIAGNOSIS — M542 Cervicalgia: Secondary | ICD-10-CM | POA: Diagnosis not present

## 2016-05-14 DIAGNOSIS — M9903 Segmental and somatic dysfunction of lumbar region: Secondary | ICD-10-CM | POA: Diagnosis not present

## 2016-05-14 DIAGNOSIS — M9901 Segmental and somatic dysfunction of cervical region: Secondary | ICD-10-CM | POA: Diagnosis not present

## 2016-05-21 DIAGNOSIS — M545 Low back pain: Secondary | ICD-10-CM | POA: Diagnosis not present

## 2016-05-21 DIAGNOSIS — M9903 Segmental and somatic dysfunction of lumbar region: Secondary | ICD-10-CM | POA: Diagnosis not present

## 2016-05-21 DIAGNOSIS — M542 Cervicalgia: Secondary | ICD-10-CM | POA: Diagnosis not present

## 2016-05-21 DIAGNOSIS — M9901 Segmental and somatic dysfunction of cervical region: Secondary | ICD-10-CM | POA: Diagnosis not present

## 2016-05-31 ENCOUNTER — Encounter: Payer: Self-pay | Admitting: Emergency Medicine

## 2016-05-31 ENCOUNTER — Emergency Department (INDEPENDENT_AMBULATORY_CARE_PROVIDER_SITE_OTHER)
Admission: EM | Admit: 2016-05-31 | Discharge: 2016-05-31 | Disposition: A | Discharge status: Home / Self Care | Payer: BLUE CROSS/BLUE SHIELD | Source: Home / Self Care | Attending: Family Medicine | Admitting: Family Medicine

## 2016-05-31 DIAGNOSIS — R590 Localized enlarged lymph nodes: Secondary | ICD-10-CM

## 2016-05-31 MED ORDER — AZITHROMYCIN 250 MG PO TABS: ORAL_TABLET | ORAL | 0 refills | Status: DC

## 2016-05-31 NOTE — ED Triage Notes (Signed)
Pt c/o pressure in neck and right ear x 5 days, worsen today.  Has taken OTC Sudafed PE w/no relief.

## 2016-05-31 NOTE — Discharge Instructions (Signed)
If cough develops, take plain guaifenesin (1200mg  extended release tabs such as Mucinex) twice daily, with plenty of water, for cough and congestion.  May add Pseudoephedrine (30mg , one or two every 4 to 6 hours) for sinus congestion.  Get adequate rest.   May use Afrin nasal spray (or generic oxymetazoline) twice daily for about 5 days and then discontinue.  Also recommend using saline nasal spray several times daily and saline nasal irrigation (AYR is a common brand).  Use Flonase nasal spray each morning after using Afrin nasal spray and saline nasal irrigation. Try warm salt water gargles for sore throat.  Stop all antihistamines for now, and other non-prescription cough/cold preparations. May take Ibuprofen 200mg , 4 tabs every 8 hours with food for earache, sore throat, etc. Begin Azithromycin if not improving about one week or if persistent fever develops   Follow-up with family doctor if not improving about10 days.

## 2016-05-31 NOTE — ED Provider Notes (Signed)
Brian Spears C[facility]    CSN: [id number] Arrival date & time: 05/31/16  1753     History   Chief Complaint Chief Complaint  Patient presents with  . Otalgia    HPI Brian Spears is a 40 y.o. male.   Patient complains of onset of sinus congestion about five days ago, followed by nausea/vomiting and diarrhea for two days, now resolved.  For the past 3 to 4 days his right ear has felt clogged and now painful.   The history is provided by the patient.    Past Medical History:  Diagnosis Date  . Hand fracture   . Hyperlipidemia   . Lyme disease   . Normal cardiac stress test 4/10   at [facility] - negative.  terminated for fatigue    Patient Active Problem List   Diagnosis Date Noted  . Medial epicondylitis of right elbow 02/18/2016  . Painful ejaculation 01/21/2016  . Onychodystrophy 12/05/2014  . Primary osteoarthritis of both elbows 12/05/2014  . Anxiety 08/18/2013  . Elevated hemoglobin ([place]) 08/01/2013  . Male hypogonadism 06/01/2013  . GERD (gastroesophageal reflux disease) 03/01/2013  . Epigastric pain 07/08/2012  . History of smoking 06/07/2012  . Annual physical exam 06/07/2012  . Celiac disease 06/30/2011  . Hyperlipidemia 09/27/2010  . Bilateral carpal tunnel syndrome 09/27/2010    Past Surgical History:  Procedure Laterality Date  . surgery on right hand         Home Medications    Prior to Admission medications   Medication Sig Start Date End Date Taking? Authorizing Provider  Multiple Vitamin (MULTIVITAMIN) tablet Take 1 tablet by mouth daily.   Yes Historical Provider, MD  azithromycin (ZITHROMAX Z-PAK) 250 MG tablet Take 2 tabs today; then begin one tab once daily for 4 more days. (Rx void after 06/08/16) 05/31/16   Kandra Nicolas, MD    Family History Family History  Problem Relation Age of Onset  . Breast cancer Mother   . Hyperlipidemia Mother   . Hypertension Father   . Hyperlipidemia Father   . Other Other     grandfather with  alcoholism  . Heart attack Other     grandmother  . Diabetes Other     grandmother  . Colon cancer Other     uncle  . Breast cancer Other     aunt  . Lung cancer Other     grandmother  . Prostate cancer Other     grandfather  . Melanoma Other     uncle    Social History Social History  Substance Use Topics  . Smoking status: Former Smoker    Packs/day: 0.20    Types: Cigarettes  . Smokeless tobacco: Former Systems developer    Types: Snuff    Quit date: 06/02/2011     Comment: started smoking at age 62, started smokeless tobacco at age 60  . Alcohol use No     Allergies   Sulfa antibiotics and Naproxen sodium   Review of Systems Review of Systems No sore throat No cough No pleuritic pain No wheezing + nasal congestion + post-nasal drainage + sinus pain/pressure No itchy/red eyes ? earache No hemoptysis No SOB No fever/chills + nausea, resolved + vomiting, resolved No abdominal pain + diarrhea, resolved No urinary symptoms No skin rash + fatigue No myalgias No headache Used OTC meds without relief   Physical Exam Triage Vital Signs ED Triage Vitals  Enc Vitals Group     BP 05/31/16 1809 114/77  Pulse Rate 05/31/16 1809 61     Resp --      Temp 05/31/16 1809 98 F (36.7 C)     Temp Source 05/31/16 1809 Oral     SpO2 05/31/16 1809 100 %     Weight 05/31/16 1809 176 lb 8 oz (80.1 kg)     Height 05/31/16 1809 5\' 8"  (1.727 m)     Head Circumference --      Peak Flow --      Pain Score 05/31/16 1811 8     Pain Loc --      Pain Edu? --      Excl. in [place]? --    No data found.   Updated Vital Signs BP 114/77 (BP Location: Left Arm)   Pulse 61   Temp 98 F (36.7 C) (Oral)   Ht 5\' 8"  (1.727 m)   Wt 176 lb 8 oz (80.1 kg)   SpO2 100%   BMI 26.84 kg/m   Visual Acuity Right Eye Distance:   Left Eye Distance:   Bilateral Distance:    Right Eye Near:   Left Eye Near:    Bilateral Near:     Physical Exam Nursing notes and Vital Signs  reviewed. Appearance:  Patient appears stated age, and in no acute distress  Eyes:  Pupils are equal, round, and reactive to light and accomodation.  Extraocular movement is intact.  Conjunctivae are not inflamed  Ears:  Canals normal.  Tympanic membranes normal.  Nose:  Mildly congested turbinates.  No sinus tenderness.  Pharynx:  Normal Neck:  Supple.  Tender enlarged posterior/lateral nodes are palpated bilaterally, more pronounced on the right. Lungs:  Clear to auscultation.  Breath sounds are equal.  Moving air well. Heart:  Regular rate and rhythm without murmurs, rubs, or gallops.  Abdomen:  Nontender without masses or hepatosplenomegaly.  Bowel sounds are present.  No CVA or flank tenderness.  Extremities:  No edema.  Skin:  No rash present.    UC Treatments / Results  Labs (all labs ordered are listed, but only abnormal results are displayed)  Labs Reviewed -  Tympanometry:  Right ear tympanogram high peak height; Left ear tympanogram "noisy"  EKG  EKG Interpretation None       Radiology No results found.  Procedures Procedures (including critical care time)  Medications Ordered in UC Medications - No data to display   Initial Impression / Assessment and Plan / UC Course  I have reviewed the triage vital signs and the nursing notes.  Pertinent labs & imaging results that were available during my care of the patient were reviewed by me and considered in my medical decision making (see chart for details).  Clinical Course  There is no evidence of bacterial infection today.  Suspect early viral URI. If cough develops, take plain guaifenesin (1200mg  extended release tabs such as Mucinex) twice daily, with plenty of water, for cough and congestion.  May add Pseudoephedrine (30mg , one or two every 4 to 6 hours) for sinus congestion.  Get adequate rest.   May use Afrin nasal spray (or generic oxymetazoline) twice daily for about 5 days and then discontinue.  Also  recommend using saline nasal spray several times daily and saline nasal irrigation (AYR is a common brand).  Use Flonase nasal spray each morning after using Afrin nasal spray and saline nasal irrigation. Try warm salt water gargles for sore throat.  Stop all antihistamines for now, and other non-prescription cough/cold preparations. May  take Ibuprofen 200mg , 4 tabs every 8 hours with food for earache, sore throat, etc. Begin Azithromycin if not improving about one week or if persistent fever develops (Given a prescription to hold, with an expiration date)  Follow-up with family doctor if not improving about10 days.      Final Clinical Impressions(s) / UC Diagnoses   Final diagnoses:  Cervical lymphadenopathy    New Prescriptions New Prescriptions   AZITHROMYCIN (ZITHROMAX Z-PAK) 250 MG TABLET    Take 2 tabs today; then begin one tab once daily for 4 more days. (Rx void after 06/08/16)     Kandra Nicolas, MD 06/02/16 2225

## 2016-06-02 ENCOUNTER — Telehealth: Payer: Self-pay | Admitting: Emergency Medicine

## 2016-06-09 ENCOUNTER — Ambulatory Visit (INDEPENDENT_AMBULATORY_CARE_PROVIDER_SITE_OTHER): Payer: BLUE CROSS/BLUE SHIELD | Admitting: Sports Medicine

## 2016-06-09 ENCOUNTER — Encounter: Payer: Self-pay | Admitting: Sports Medicine

## 2016-06-09 ENCOUNTER — Other Ambulatory Visit: Payer: Self-pay

## 2016-06-09 DIAGNOSIS — E785 Hyperlipidemia, unspecified: Secondary | ICD-10-CM

## 2016-06-09 DIAGNOSIS — H9311 Tinnitus, right ear: Secondary | ICD-10-CM | POA: Insufficient documentation

## 2016-06-09 DIAGNOSIS — G5601 Carpal tunnel syndrome, right upper limb: Secondary | ICD-10-CM

## 2016-06-09 DIAGNOSIS — H6981 Other specified disorders of Eustachian tube, right ear: Secondary | ICD-10-CM | POA: Diagnosis not present

## 2016-06-09 DIAGNOSIS — H8109 Meniere's disease, unspecified ear: Secondary | ICD-10-CM | POA: Insufficient documentation

## 2016-06-09 DIAGNOSIS — H6980 Other specified disorders of Eustachian tube, unspecified ear: Secondary | ICD-10-CM | POA: Insufficient documentation

## 2016-06-09 DIAGNOSIS — H6991 Unspecified Eustachian tube disorder, right ear: Secondary | ICD-10-CM

## 2016-06-09 LAB — LIPID PANEL
Cholesterol: 200 mg/dL (ref 125–200)
HDL: 76 mg/dL (ref >=40)
LDL Cholesterol: 113 mg/dL (ref <130)
Total CHOL/HDL Ratio: 2.6 Ratio (ref <=5.0)
Triglycerides: 55 mg/dL (ref <150)
VLDL: 11 mg/dL (ref <30)

## 2016-06-09 LAB — LDL CHOLESTEROL, DIRECT: Direct LDL: 113 mg/dL (ref <130)

## 2016-06-09 MED ORDER — FLUTICASONE PROPIONATE 50 MCG/ACT NA SUSP: NASAL | 3 refills | Status: DC

## 2016-06-09 NOTE — Assessment & Plan Note (Signed)
Repeat left median nerve hydrodissection.

## 2016-06-09 NOTE — Progress Notes (Signed)
Subjective:    CC: R ear pressure, tinnitus   HPI: 40 yo with history of eustachian tube dysfunction presenting with 10 days of R ear pressure and tinnitus.  He says 10 days ago he developed "muffled hearing" in his R ear as well as pain radiating down the right side of his neck.  These symptoms were associated with nausea, vomiting, and abdominal pain.  He was diagnosed with a viral URI at Urgent Care and instructed to take Afrin, Pseudoephedrine.  He took Afrin for 2 days and pseudoephedrine for a week, with some relief.  He says his GI symptoms have resolved, but he continues to endorse R ear tinnitus, pressure, and neck pain.  He also developed a sore throat that began yesterday, as well as some "facial sinus pressure" below his eyes.  Denies any cough, runny nose, SOB, CP, fevers, myalgias.  No pain or tinnitus in his L ear.     He is also requesting a carpal tunnel injection to his R hand. Last injection was 7 months ago.   Past medical history:  Negative.  See flowsheet/record as well for more information.  Surgical history: Negative.  See flowsheet/record as well for more information.  Family history: Negative.  See flowsheet/record as well for more information.  Social history: Negative.  See flowsheet/record as well for more information.  Allergies, and medications have been entered into the medical record, reviewed, and no changes needed.   Review of Systems: No fevers, chills, night sweats, weight loss, chest pain, or shortness of breath.   Objective:    General: Well Developed, well nourished, and in no acute distress.  Neuro: Alert and oriented x3, extra-ocular muscles intact, sensation grossly intact.  HEENT: Normocephalic, atraumatic, pupils equal round reactive to light, neck supple, no masses, no lymphadenopathy, thyroid nonpalpable  Mild tonsillar hypertrophy and erythema. Otosclerosis from prior ear tubes bilaterally.  Normal TMs, no erythema or fluid.  No swelling behind  mastoid.     Skin: Warm and dry, no rashes. Cardiac: Regular rate and rhythm, no murmurs rubs or gallops, no lower extremity edema.  Respiratory: Clear to auscultation bilaterally. Not using accessory muscles, speaking in full sentences.  Procedure: Real-time Ultrasound Guided Injection of left median nerve hydrodissection Device: GE Logiq E  Verbal informed consent obtained.  Time-out conducted.  Noted no overlying erythema, induration, or other signs of local infection.  Skin prepped in a sterile fashion.  Local anesthesia: Topical Ethyl chloride.  With sterile technique and under real time ultrasound guidance:  Using 1 mL kenalog 40, 5 mL lidocaine injected medication both superficial to and deep to the median nerve in the carpal tunnel, needle was then redirected and medication injected deep into the tunnel. Completed without difficulty  Pain immediately resolved suggesting accurate placement of the medication.  Advised to call if fevers/chills, erythema, induration, drainage, or persistent bleeding.  Images permanently stored and available for review in the ultrasound unit.  Impression: Technically successful ultrasound guided injection.  Impression and Recommendations:    1. R ear tinnitus and pressure: likely eustachian tube dysfunction given normal ear exam and history of eustachian tube dysfunction in the past -daily Flonase -daily Claritin -return in 1 week if symptoms persist    2. L Carpal tunnel syndrome -median nerve hydrodissection today   I have seen and examined the below patient, I agree with the med student's findings, assessment, and plan. ___________________________________________ Gwen Her. Dianah Field, M.D., ABFM., CAQSM. Primary Care and [place]  Adjunct Instructor of [employer]

## 2016-06-09 NOTE — Assessment & Plan Note (Signed)
No evidence of otitis or mastoiditis. Flonase, return as needed, also should do daily second-generation antihistamines.

## 2016-06-11 DIAGNOSIS — M542 Cervicalgia: Secondary | ICD-10-CM | POA: Diagnosis not present

## 2016-06-11 DIAGNOSIS — M545 Low back pain: Secondary | ICD-10-CM | POA: Diagnosis not present

## 2016-06-11 DIAGNOSIS — M9901 Segmental and somatic dysfunction of cervical region: Secondary | ICD-10-CM | POA: Diagnosis not present

## 2016-06-11 DIAGNOSIS — M9903 Segmental and somatic dysfunction of lumbar region: Secondary | ICD-10-CM | POA: Diagnosis not present

## 2016-06-25 DIAGNOSIS — M9901 Segmental and somatic dysfunction of cervical region: Secondary | ICD-10-CM | POA: Diagnosis not present

## 2016-06-25 DIAGNOSIS — M545 Low back pain: Secondary | ICD-10-CM | POA: Diagnosis not present

## 2016-06-25 DIAGNOSIS — M9903 Segmental and somatic dysfunction of lumbar region: Secondary | ICD-10-CM | POA: Diagnosis not present

## 2016-06-25 DIAGNOSIS — M542 Cervicalgia: Secondary | ICD-10-CM | POA: Diagnosis not present

## 2016-07-16 DIAGNOSIS — S39012A Strain of muscle, fascia and tendon of lower back, initial encounter: Secondary | ICD-10-CM | POA: Diagnosis not present

## 2016-07-16 DIAGNOSIS — M542 Cervicalgia: Secondary | ICD-10-CM | POA: Diagnosis not present

## 2016-07-16 DIAGNOSIS — M9901 Segmental and somatic dysfunction of cervical region: Secondary | ICD-10-CM | POA: Diagnosis not present

## 2016-07-16 DIAGNOSIS — S161XXA Strain of muscle, fascia and tendon at neck level, initial encounter: Secondary | ICD-10-CM | POA: Diagnosis not present

## 2016-08-11 ENCOUNTER — Encounter: Payer: Self-pay | Admitting: Sports Medicine

## 2016-08-11 DIAGNOSIS — R5383 Other fatigue: Secondary | ICD-10-CM

## 2016-08-13 DIAGNOSIS — S161XXA Strain of muscle, fascia and tendon at neck level, initial encounter: Secondary | ICD-10-CM | POA: Diagnosis not present

## 2016-08-13 DIAGNOSIS — M9901 Segmental and somatic dysfunction of cervical region: Secondary | ICD-10-CM | POA: Diagnosis not present

## 2016-08-13 DIAGNOSIS — M546 Pain in thoracic spine: Secondary | ICD-10-CM | POA: Diagnosis not present

## 2016-08-13 DIAGNOSIS — M542 Cervicalgia: Secondary | ICD-10-CM | POA: Diagnosis not present

## 2016-08-13 DIAGNOSIS — M9903 Segmental and somatic dysfunction of lumbar region: Secondary | ICD-10-CM | POA: Diagnosis not present

## 2016-08-13 DIAGNOSIS — M9902 Segmental and somatic dysfunction of thoracic region: Secondary | ICD-10-CM | POA: Diagnosis not present

## 2016-08-13 DIAGNOSIS — M545 Low back pain: Secondary | ICD-10-CM | POA: Diagnosis not present

## 2016-08-13 DIAGNOSIS — S39012A Strain of muscle, fascia and tendon of lower back, initial encounter: Secondary | ICD-10-CM | POA: Diagnosis not present

## 2016-08-18 DIAGNOSIS — R5383 Other fatigue: Secondary | ICD-10-CM | POA: Diagnosis not present

## 2016-08-18 LAB — CBC
HCT: 46.1 % (ref 38.5–50.0)
Hemoglobin: 15.5 g/dL (ref 13.2–17.1)
MCH: 28.3 pg (ref 27.0–33.0)
MCHC: 33.6 g/dL (ref 32.0–36.0)
MCV: 84.1 fL (ref 80.0–100.0)
MPV: 10.1 fL (ref 7.5–12.5)
Platelets: 238 10*3/uL (ref 140–400)
RBC: 5.48 MIL/uL (ref 4.20–5.80)
RDW: 14.1 % (ref 11.0–15.0)
WBC: 4.5 K/uL (ref 3.8–10.8)

## 2016-08-19 LAB — COMPREHENSIVE METABOLIC PANEL WITH GFR
AST: 28 U/L (ref 10–40)
Albumin: 4.4 g/dL (ref 3.6–5.1)
Alkaline Phosphatase: 54 U/L (ref 40–115)
Creat: 0.95 mg/dL (ref 0.60–1.35)
Sodium: 138 mmol/L (ref 135–146)
Total Bilirubin: 0.7 mg/dL (ref 0.2–1.2)
Total Protein: 7.6 g/dL (ref 6.1–8.1)

## 2016-08-19 LAB — TESTOSTERONE TOTAL,FREE,BIO, MALES
Albumin: 4.4 g/dL (ref 3.6–5.1)
Sex Hormone Binding: 45 nmol/L (ref 10–50)
Testosterone, Bioavailable: 90.4 ng/dL — ABNORMAL LOW (ref 110.0–575.0)
Testosterone, Free: 44.9 pg/mL — ABNORMAL LOW (ref 46.0–224.0)
Testosterone: 440 ng/dL (ref 250–827)

## 2016-08-19 LAB — VITAMIN B12: Vitamin B-12: 798 pg/mL (ref 200–1100)

## 2016-08-19 LAB — COMPREHENSIVE METABOLIC PANEL
ALT: 32 U/L (ref 9–46)
BUN: 14 mg/dL (ref 7–25)
CO2: 27 mmol/L (ref 20–31)
Calcium: 9.8 mg/dL (ref 8.6–10.3)
Chloride: 103 mmol/L (ref 98–110)
Glucose, Bld: 106 mg/dL — ABNORMAL HIGH (ref 65–99)
Potassium: 4.7 mmol/L (ref 3.5–5.3)

## 2016-08-19 LAB — VITAMIN D 25 HYDROXY (VIT D DEFICIENCY, FRACTURES): Vit D, 25-Hydroxy: 64 ng/mL (ref 30–100)

## 2016-08-19 LAB — TSH: TSH: 2.29 m[IU]/L (ref 0.40–4.50)

## 2016-08-22 ENCOUNTER — Encounter: Payer: Self-pay | Admitting: Sports Medicine

## 2016-08-22 MED ORDER — DOXYCYCLINE HYCLATE 100 MG PO TABS: 100.0000 mg | ORAL_TABLET | Freq: Two times a day (BID) | ORAL | 0 refills | Status: AC

## 2016-08-27 DIAGNOSIS — M542 Cervicalgia: Secondary | ICD-10-CM | POA: Diagnosis not present

## 2016-08-27 DIAGNOSIS — S39012A Strain of muscle, fascia and tendon of lower back, initial encounter: Secondary | ICD-10-CM | POA: Diagnosis not present

## 2016-08-27 DIAGNOSIS — S161XXA Strain of muscle, fascia and tendon at neck level, initial encounter: Secondary | ICD-10-CM | POA: Diagnosis not present

## 2016-08-27 DIAGNOSIS — M9901 Segmental and somatic dysfunction of cervical region: Secondary | ICD-10-CM | POA: Diagnosis not present

## 2016-08-31 ENCOUNTER — Encounter: Payer: Self-pay | Admitting: Sports Medicine

## 2016-09-02 MED ORDER — TRIAMCINOLONE ACETONIDE 0.5 % EX CREA: 1.0000 "application " | TOPICAL_CREAM | Freq: Two times a day (BID) | CUTANEOUS | 3 refills | Status: DC

## 2016-09-03 DIAGNOSIS — M9901 Segmental and somatic dysfunction of cervical region: Secondary | ICD-10-CM | POA: Diagnosis not present

## 2016-09-03 DIAGNOSIS — S39012A Strain of muscle, fascia and tendon of lower back, initial encounter: Secondary | ICD-10-CM | POA: Diagnosis not present

## 2016-09-03 DIAGNOSIS — M542 Cervicalgia: Secondary | ICD-10-CM | POA: Diagnosis not present

## 2016-09-03 DIAGNOSIS — S161XXA Strain of muscle, fascia and tendon at neck level, initial encounter: Secondary | ICD-10-CM | POA: Diagnosis not present

## 2016-09-10 DIAGNOSIS — M9901 Segmental and somatic dysfunction of cervical region: Secondary | ICD-10-CM | POA: Diagnosis not present

## 2016-09-10 DIAGNOSIS — M9903 Segmental and somatic dysfunction of lumbar region: Secondary | ICD-10-CM | POA: Diagnosis not present

## 2016-09-10 DIAGNOSIS — M546 Pain in thoracic spine: Secondary | ICD-10-CM | POA: Diagnosis not present

## 2016-09-10 DIAGNOSIS — S161XXA Strain of muscle, fascia and tendon at neck level, initial encounter: Secondary | ICD-10-CM | POA: Diagnosis not present

## 2016-09-10 DIAGNOSIS — S39012A Strain of muscle, fascia and tendon of lower back, initial encounter: Secondary | ICD-10-CM | POA: Diagnosis not present

## 2016-09-10 DIAGNOSIS — M9902 Segmental and somatic dysfunction of thoracic region: Secondary | ICD-10-CM | POA: Diagnosis not present

## 2016-09-10 DIAGNOSIS — M542 Cervicalgia: Secondary | ICD-10-CM | POA: Diagnosis not present

## 2016-09-10 DIAGNOSIS — M545 Low back pain: Secondary | ICD-10-CM | POA: Diagnosis not present

## 2016-09-17 ENCOUNTER — Ambulatory Visit: Payer: Self-pay | Admitting: Sports Medicine

## 2016-09-22 ENCOUNTER — Encounter: Payer: Self-pay | Admitting: Sports Medicine

## 2016-09-22 ENCOUNTER — Ambulatory Visit (INDEPENDENT_AMBULATORY_CARE_PROVIDER_SITE_OTHER): Payer: BLUE CROSS/BLUE SHIELD | Admitting: Sports Medicine

## 2016-09-22 DIAGNOSIS — R232 Flushing: Secondary | ICD-10-CM

## 2016-09-22 DIAGNOSIS — G5602 Carpal tunnel syndrome, left upper limb: Secondary | ICD-10-CM

## 2016-09-22 NOTE — Assessment & Plan Note (Signed)
Left median nerve hydrodissection as above.

## 2016-09-22 NOTE — Progress Notes (Signed)
  Subjective:    CC: Carpal tunnel  HPI: This is a pleasant 41 year old male with known carpal tunnel syndrome, he recently took a great deal of supplements and developed some flushing, he also had some palpitations, all of which are resolving. He is also having recurrence of carpal tunnel type symptoms on the left and  desires repeat median nerve hydrodissection. It is a moderate, persistent with radiation into the hand.  Past medical history:  Negative.  See flowsheet/record as well for more information.  Surgical history: Negative.  See flowsheet/record as well for more information.  Family history: Negative.  See flowsheet/record as well for more information.  Social history: Negative.  See flowsheet/record as well for more information.  Allergies, and medications have been entered into the medical record, reviewed, and no changes needed.   Review of Systems: No fevers, chills, night sweats, weight loss, chest pain, or shortness of breath.   Objective:    General: Well Developed, well nourished, and in no acute distress.  Neuro: Alert and oriented x3, extra-ocular muscles intact, sensation grossly intact.  HEENT: Normocephalic, atraumatic, pupils equal round reactive to light, neck supple, no masses, no lymphadenopathy, thyroid nonpalpable.  Skin: Warm and dry, no rashes. Cardiac: Regular rate and rhythm, no murmurs rubs or gallops, no lower extremity edema.  Respiratory: Clear to auscultation bilaterally. Not using accessory muscles, speaking in full sentences.  Procedure: Real-time Ultrasound Guided Injection of left median nerve hydrodissection Device: GE Logiq E  Verbal informed consent obtained.  Time-out conducted.  Noted no overlying erythema, induration, or other signs of local infection.  Skin prepped in a sterile fashion.  Local anesthesia: Topical Ethyl chloride.  With sterile technique and under real time ultrasound guidance:  Injected medication both superficial to and  deep to the median nerve in the carpal tunnel for a total of 1 mL Kenalog 40, 5 mL Marcaine. Completed without difficulty  Pain immediately resolved suggesting accurate placement of the medication.  Advised to call if fevers/chills, erythema, induration, drainage, or persistent bleeding.  Images permanently stored and available for review in the ultrasound unit.  Impression: Technically successful ultrasound guided injection.  Impression and Recommendations:    Bilateral carpal tunnel syndrome Left median nerve hydrodissection as above.  Flushing After taking large doses of vitamin supplements, I do suspect that this is related to excessive doses of vitamin B2 and B3.  I spent 25 minutes with this patient, greater than 50% was face-to-face time counseling regarding the above diagnoses, this was separate from the time spent performing the procedure

## 2016-09-22 NOTE — Assessment & Plan Note (Signed)
After taking large doses of vitamin supplements, I do suspect that this is related to excessive doses of vitamin B2 and B3.

## 2016-09-24 DIAGNOSIS — S39012A Strain of muscle, fascia and tendon of lower back, initial encounter: Secondary | ICD-10-CM | POA: Diagnosis not present

## 2016-09-24 DIAGNOSIS — S161XXA Strain of muscle, fascia and tendon at neck level, initial encounter: Secondary | ICD-10-CM | POA: Diagnosis not present

## 2016-09-24 DIAGNOSIS — M542 Cervicalgia: Secondary | ICD-10-CM | POA: Diagnosis not present

## 2016-09-24 DIAGNOSIS — M9901 Segmental and somatic dysfunction of cervical region: Secondary | ICD-10-CM | POA: Diagnosis not present

## 2016-10-08 DIAGNOSIS — S161XXA Strain of muscle, fascia and tendon at neck level, initial encounter: Secondary | ICD-10-CM | POA: Diagnosis not present

## 2016-10-08 DIAGNOSIS — M9903 Segmental and somatic dysfunction of lumbar region: Secondary | ICD-10-CM | POA: Diagnosis not present

## 2016-10-08 DIAGNOSIS — M542 Cervicalgia: Secondary | ICD-10-CM | POA: Diagnosis not present

## 2016-10-08 DIAGNOSIS — M9901 Segmental and somatic dysfunction of cervical region: Secondary | ICD-10-CM | POA: Diagnosis not present

## 2016-10-08 DIAGNOSIS — S39012A Strain of muscle, fascia and tendon of lower back, initial encounter: Secondary | ICD-10-CM | POA: Diagnosis not present

## 2016-10-08 DIAGNOSIS — M9902 Segmental and somatic dysfunction of thoracic region: Secondary | ICD-10-CM | POA: Diagnosis not present

## 2016-10-28 ENCOUNTER — Encounter: Payer: Self-pay | Admitting: Sports Medicine

## 2016-10-28 ENCOUNTER — Ambulatory Visit (INDEPENDENT_AMBULATORY_CARE_PROVIDER_SITE_OTHER): Payer: BLUE CROSS/BLUE SHIELD | Admitting: Sports Medicine

## 2016-10-28 DIAGNOSIS — H6991 Unspecified Eustachian tube disorder, right ear: Secondary | ICD-10-CM

## 2016-10-28 DIAGNOSIS — H6981 Other specified disorders of Eustachian tube, right ear: Secondary | ICD-10-CM

## 2016-10-28 MED ORDER — PREDNISONE 50 MG PO TABS: ORAL_TABLET | ORAL | 0 refills | Status: DC

## 2016-10-28 MED ORDER — PHENYLEPHRINE HCL 10 MG PO TABS: 10.0000 mg | ORAL_TABLET | Freq: Three times a day (TID) | ORAL | 0 refills | Status: DC

## 2016-10-28 NOTE — Progress Notes (Signed)
  Subjective:    CC: Right ear problem  HPI: For the past several days this pleasant 41 year old male has noted increasing difficulty caring from the right ear, occasional dizziness, ringing. Symptoms are moderate, worsening. He does have a brewing viral upper respiratory type infection with sore throat, Raynaud's, cough. Symptoms are moderate, persistent.  Past medical history:  Negative.  See flowsheet/record as well for more information.  Surgical history: Negative.  See flowsheet/record as well for more information.  Family history: Negative.  See flowsheet/record as well for more information.  Social history: Negative.  See flowsheet/record as well for more information.  Allergies, and medications have been entered into the medical record, reviewed, and no changes needed.   Review of Systems: No fevers, chills, night sweats, weight loss, chest pain, or shortness of breath.   Objective:    General: Well Developed, well nourished, and in no acute distress.  Neuro: Alert and oriented x3, extra-ocular muscles intact, sensation grossly intact.  HEENT: Normocephalic, atraumatic, pupils equal round reactive to light, neck supple, no masses, no lymphadenopathy, thyroid nonpalpable. Oropharynx, nasopharynx, ear canals unremarkable, there is only minimal right tympanic membrane sclerosis. Canal is normal. Skin: Warm and dry, no rashes. Cardiac: Regular rate and rhythm, no murmurs rubs or gallops, no lower extremity edema.  Respiratory: Clear to auscultation bilaterally. Not using accessory muscles, speaking in full sentences.  Impression and Recommendations:    Eustachian tube dysfunction No evidence of otitis or mastoiditis, recurrence of this in the ear, difficulty hearing, ringing, and dizziness, he is currently having a viral URI. Adding prednisone, he will get over-the-counter phenylephrine. Return to see me as needed.  I spent 25 minutes with this patient, greater than 50% was  face-to-face time counseling regarding the above diagnoses

## 2016-10-28 NOTE — Assessment & Plan Note (Signed)
No evidence of otitis or mastoiditis, recurrence of this in the ear, difficulty hearing, ringing, and dizziness, he is currently having a viral URI. Adding prednisone, he will get over-the-counter phenylephrine. Return to see me as needed.

## 2016-11-03 ENCOUNTER — Encounter: Payer: Self-pay | Admitting: Sports Medicine

## 2016-11-03 MED ORDER — AZITHROMYCIN 250 MG PO TABS: ORAL_TABLET | ORAL | 0 refills | Status: DC

## 2016-11-17 ENCOUNTER — Encounter: Payer: Self-pay | Admitting: Sports Medicine

## 2016-11-17 DIAGNOSIS — H6980 Other specified disorders of Eustachian tube, unspecified ear: Secondary | ICD-10-CM

## 2016-11-17 DIAGNOSIS — H6990 Unspecified Eustachian tube disorder, unspecified ear: Secondary | ICD-10-CM

## 2016-11-26 DIAGNOSIS — M26621 Arthralgia of right temporomandibular joint: Secondary | ICD-10-CM | POA: Diagnosis not present

## 2016-11-26 DIAGNOSIS — H93293 Other abnormal auditory perceptions, bilateral: Secondary | ICD-10-CM | POA: Diagnosis not present

## 2016-12-23 DIAGNOSIS — M9901 Segmental and somatic dysfunction of cervical region: Secondary | ICD-10-CM | POA: Diagnosis not present

## 2016-12-23 DIAGNOSIS — S39012A Strain of muscle, fascia and tendon of lower back, initial encounter: Secondary | ICD-10-CM | POA: Diagnosis not present

## 2016-12-23 DIAGNOSIS — S161XXA Strain of muscle, fascia and tendon at neck level, initial encounter: Secondary | ICD-10-CM | POA: Diagnosis not present

## 2016-12-23 DIAGNOSIS — M542 Cervicalgia: Secondary | ICD-10-CM | POA: Diagnosis not present

## 2017-01-03 DIAGNOSIS — M9903 Segmental and somatic dysfunction of lumbar region: Secondary | ICD-10-CM | POA: Diagnosis not present

## 2017-01-03 DIAGNOSIS — M545 Low back pain: Secondary | ICD-10-CM | POA: Diagnosis not present

## 2017-01-03 DIAGNOSIS — M546 Pain in thoracic spine: Secondary | ICD-10-CM | POA: Diagnosis not present

## 2017-01-03 DIAGNOSIS — M542 Cervicalgia: Secondary | ICD-10-CM | POA: Diagnosis not present

## 2017-01-03 DIAGNOSIS — M9902 Segmental and somatic dysfunction of thoracic region: Secondary | ICD-10-CM | POA: Diagnosis not present

## 2017-01-03 DIAGNOSIS — M9901 Segmental and somatic dysfunction of cervical region: Secondary | ICD-10-CM | POA: Diagnosis not present

## 2017-01-09 ENCOUNTER — Encounter: Payer: Self-pay | Admitting: Sports Medicine

## 2017-01-09 ENCOUNTER — Ambulatory Visit (INDEPENDENT_AMBULATORY_CARE_PROVIDER_SITE_OTHER): Payer: BLUE CROSS/BLUE SHIELD | Admitting: Sports Medicine

## 2017-01-09 DIAGNOSIS — E291 Testicular hypofunction: Secondary | ICD-10-CM

## 2017-01-09 DIAGNOSIS — G5602 Carpal tunnel syndrome, left upper limb: Secondary | ICD-10-CM | POA: Diagnosis not present

## 2017-01-09 DIAGNOSIS — M7701 Medial epicondylitis, right elbow: Secondary | ICD-10-CM | POA: Diagnosis not present

## 2017-01-09 MED ORDER — CLOMIPHENE CITRATE 50 MG PO TABS: ORAL_TABLET | ORAL | 3 refills | Status: DC

## 2017-01-09 NOTE — Progress Notes (Signed)
Subjective:    CC: Multiple issues  HPI: Fatigue: Known hypogonadism, felt somewhat better with testosterone supplementation but he did get some polycythemia.  Carpal tunnel syndrome, left: Previous median nerve hydrodissection was 4 months ago, having recurrence of symptoms, desires repeat.  Right elbow pain: Localized at the medial epicondyle. Pain is moderate, persistent and has been many months since his last injection, desires repeat.    Past medical history:  Negative.  See flowsheet/record as well for more information.  Surgical history: Negative.  See flowsheet/record as well for more information.  Family history: Negative.  See flowsheet/record as well for more information.  Social history: Negative.  See flowsheet/record as well for more information.  Allergies, and medications have been entered into the medical record, reviewed, and no changes needed.   Review of Systems: No fevers, chills, night sweats, weight loss, chest pain, or shortness of breath.   Objective:    General: Well Developed, well nourished, and in no acute distress.  Neuro: Alert and oriented x3, extra-ocular muscles intact, sensation grossly intact.  HEENT: Normocephalic, atraumatic, pupils equal round reactive to light, neck supple, no masses, no lymphadenopathy, thyroid nonpalpable.  Skin: Warm and dry, no rashes. Cardiac: Regular rate and rhythm, no murmurs rubs or gallops, no lower extremity edema.  Respiratory: Clear to auscultation bilaterally. Not using accessory muscles, speaking in full sentences.  Procedure: Real-time Ultrasound Guided Injection of left median nerve hydrodissection Device: GE Logiq E  Verbal informed consent obtained.  Time-out conducted.  Noted no overlying erythema, induration, or other signs of local infection.  Skin prepped in a sterile fashion.  Local anesthesia: Topical Ethyl chloride.  With sterile technique and under real time ultrasound guidance:  Injected  medication both superficial to and deep to the median nerve in the carpal tunnel for a total of 1 mL Kenalog 40, 5 mL lidocaine. Completed without difficulty  Pain immediately resolved suggesting accurate placement of the medication.  Advised to call if fevers/chills, erythema, induration, drainage, or persistent bleeding.  Images permanently stored and available for review in the ultrasound unit.  Impression: Technically successful ultrasound guided injection.  Procedure: Real-time Ultrasound Guided Injection of right common flexor tendon origin Device: GE Logiq E  Verbal informed consent obtained.  Time-out conducted.  Noted no overlying erythema, induration, or other signs of local infection.  Skin prepped in a sterile fashion.  Local anesthesia: Topical Ethyl chloride.  With sterile technique and under real time ultrasound guidance:  Injected medication both superficial to and deep to the common flexor tendon at the medial epicondyle with total of 1 mL kenalog 40, 1 mL lidocaine, 1 mL bupivacaine. Completed without difficulty  Pain immediately resolved suggesting accurate placement of the medication.  Advised to call if fevers/chills, erythema, induration, drainage, or persistent bleeding.  Images permanently stored and available for review in the ultrasound unit.  Impression: Technically successful ultrasound guided injection.  Impression and Recommendations:    Medial epicondylitis of right elbow Right common extensor tendon origin injection.  Bilateral carpal tunnel syndrome I performed a median nerve hydrodissection of the left back in January, repeating today.  Male hypogonadism Did get some polycythemia in the past with every 2 week dosing. Checking testosterone, FSH, LH.  Starting Clomid in the hopes that we can do the testosterone up without polycythemia. Starting with one quarter pill daily, recheck in one month.  I spent 40 minutes with this patient, greater than 50%  was face-to-face time counseling regarding the above diagnoses, this was  separate from the time spent performing the procedures.

## 2017-01-09 NOTE — Assessment & Plan Note (Signed)
Did get some polycythemia in the past with every 2 week dosing. Checking testosterone, FSH, LH.  Starting Clomid in the hopes that we can do the testosterone up without polycythemia. Starting with one quarter pill daily, recheck in one month.

## 2017-01-09 NOTE — Assessment & Plan Note (Signed)
I performed a median nerve hydrodissection of the left back in January, repeating today.

## 2017-01-09 NOTE — Assessment & Plan Note (Signed)
Right common extensor tendon origin injection.

## 2017-01-10 LAB — FOLLICLE STIMULATING HORMONE: FSH: 4.4 m[IU]/mL (ref 1.6–8.0)

## 2017-01-10 LAB — LUTEINIZING HORMONE: LH: 1.4 m[IU]/mL — ABNORMAL LOW (ref 1.5–9.3)

## 2017-01-12 ENCOUNTER — Telehealth: Payer: Self-pay | Admitting: Sports Medicine

## 2017-01-12 LAB — TESTOSTERONE TOTAL,FREE,BIO, MALES
Albumin: 4.3 g/dL (ref 3.6–5.1)
Sex Hormone Binding: 32 nmol/L (ref 10–50)
Testosterone: 246 ng/dL — ABNORMAL LOW (ref 250–827)

## 2017-01-12 NOTE — Telephone Encounter (Signed)
Pt called and wanted to know if you wanted him to go ahead and start the clomid.

## 2017-01-12 NOTE — Telephone Encounter (Signed)
Not just yet, I'm waiting to see if his testosterone levels come back low, it will probably be back tomorrow. I have some of the other blood work but we will let him know when we have it all.

## 2017-01-14 DIAGNOSIS — M542 Cervicalgia: Secondary | ICD-10-CM | POA: Diagnosis not present

## 2017-01-14 DIAGNOSIS — S161XXA Strain of muscle, fascia and tendon at neck level, initial encounter: Secondary | ICD-10-CM | POA: Diagnosis not present

## 2017-01-14 DIAGNOSIS — S39012A Strain of muscle, fascia and tendon of lower back, initial encounter: Secondary | ICD-10-CM | POA: Diagnosis not present

## 2017-01-14 DIAGNOSIS — M9901 Segmental and somatic dysfunction of cervical region: Secondary | ICD-10-CM | POA: Diagnosis not present

## 2017-02-03 ENCOUNTER — Encounter: Payer: Self-pay | Admitting: Sports Medicine

## 2017-02-05 ENCOUNTER — Ambulatory Visit (INDEPENDENT_AMBULATORY_CARE_PROVIDER_SITE_OTHER): Payer: BLUE CROSS/BLUE SHIELD | Admitting: Sports Medicine

## 2017-02-05 DIAGNOSIS — E291 Testicular hypofunction: Secondary | ICD-10-CM | POA: Diagnosis not present

## 2017-02-05 DIAGNOSIS — I951 Orthostatic hypotension: Secondary | ICD-10-CM | POA: Diagnosis not present

## 2017-02-05 DIAGNOSIS — E7800 Pure hypercholesterolemia, unspecified: Secondary | ICD-10-CM | POA: Diagnosis not present

## 2017-02-05 LAB — COMPREHENSIVE METABOLIC PANEL
ALT: 27 U/L (ref 9–46)
Albumin: 4.1 g/dL (ref 3.6–5.1)
Alkaline Phosphatase: 45 U/L (ref 40–115)
Chloride: 105 mmol/L (ref 98–110)
Creat: 0.87 mg/dL (ref 0.60–1.35)
Sodium: 139 mmol/L (ref 135–146)
Total Bilirubin: 0.6 mg/dL (ref 0.2–1.2)
Total Protein: 7.1 g/dL (ref 6.1–8.1)

## 2017-02-05 LAB — URINALYSIS
Bilirubin Urine: NEGATIVE
Glucose, UA: NEGATIVE
Hgb urine dipstick: NEGATIVE
Ketones, ur: NEGATIVE
Leukocytes, UA: NEGATIVE
Nitrite: NEGATIVE
Protein, ur: NEGATIVE
Specific Gravity, Urine: 1.022 (ref 1.001–1.035)
pH: 5 (ref 5.0–8.0)

## 2017-02-05 LAB — CBC
HCT: 48.4 % (ref 38.5–50.0)
Hemoglobin: 15.8 g/dL (ref 13.2–17.1)
MCH: 29 pg (ref 27.0–33.0)
MCHC: 32.6 g/dL (ref 32.0–36.0)
MCV: 88.8 fL (ref 80.0–100.0)
MPV: 10.2 fL (ref 7.5–12.5)
Platelets: 235 K/uL (ref 140–400)
RBC: 5.45 MIL/uL (ref 4.20–5.80)
RDW: 13 % (ref 11.0–15.0)
WBC: 5 K/uL (ref 3.8–10.8)

## 2017-02-05 LAB — COMPREHENSIVE METABOLIC PANEL WITH GFR
AST: 23 U/L (ref 10–40)
BUN: 17 mg/dL (ref 7–25)
CO2: 25 mmol/L (ref 20–31)
Calcium: 9.2 mg/dL (ref 8.6–10.3)
Glucose, Bld: 97 mg/dL (ref 65–99)
Potassium: 4.7 mmol/L (ref 3.5–5.3)

## 2017-02-05 LAB — LIPID PANEL W/REFLEX DIRECT LDL
Cholesterol: 202 mg/dL — ABNORMAL HIGH (ref <200)
HDL: 82 mg/dL (ref >40)
LDL-Cholesterol: 106 mg/dL — ABNORMAL HIGH
Non-HDL Cholesterol (Calc): 120 mg/dL (ref <130)
Total CHOL/HDL Ratio: 2.5 ratio (ref <5.0)
Triglycerides: 54 mg/dL (ref <150)

## 2017-02-05 LAB — TSH: TSH: 2.43 mIU/L (ref 0.40–4.50)

## 2017-02-05 NOTE — Assessment & Plan Note (Addendum)
Had been working outside in the heat profusely sweating, drinking energy, then developed some orthostasis with standing. Overall this is improving, orthostatic vitals are normal today. Brian Spears check some blood work including a urinalysis for specific gravity. Advised when he rehydrate himself he should not use simple water he try to use a salt-containing beverage.

## 2017-02-05 NOTE — Assessment & Plan Note (Signed)
Unable to tolerate Clomid, discontinuing this, no further treatment needed.

## 2017-02-05 NOTE — Patient Instructions (Signed)
Orthostatic Hypotension Orthostatic hypotension is a sudden drop in blood pressure that happens when you quickly change positions, such as when you get up from a seated or lying position. Blood pressure is a measurement of how strongly, or weakly, your blood is pressing against the walls of your arteries. Arteries are blood vessels that carry blood from your heart throughout your body. When blood pressure is too low, you may not get enough blood to your brain or to the rest of your organs. This can cause weakness, light-headedness, rapid heartbeat, and fainting. This can last for just a few seconds or for up to a few minutes. Orthostatic hypotension is usually not a serious problem. However, if it happens frequently or gets worse, it may be a sign of something more serious. What are the causes? This condition may be caused by:  Sudden changes in posture, such as standing up quickly after you have been sitting or lying down.  Blood loss.  Loss of body fluids (dehydration).  Heart problems.  Hormone (endocrine) problems.  Pregnancy.  Severe infection.  Lack of certain nutrients.  Severe allergic reactions (anaphylaxis).  Certain medicines, such as blood pressure medicine or medicines that make the body lose excess fluids (diuretics). Sometimes, this condition can be caused by not taking medicine as directed, such as taking too much of a certain medicine.  What increases the risk? Certain factors can make you more likely to develop orthostatic hypotension, including:  Age. Risk increases as you get older.  Conditions that affect the heart or the central nervous system.  Taking certain medicines, such as blood pressure medicine or diuretics.  Being pregnant.  What are the signs or symptoms? Symptoms of this condition may include:  Weakness.  Light-headedness.  Dizziness.  Blurred vision.  Fatigue.  Rapid heartbeat.  Fainting, in severe cases.  How is this  diagnosed? This condition is diagnosed based on:  Your medical history.  Your symptoms.  Your blood pressure measurement. Your health care provider will check your blood pressure when you are: ? Lying down. ? Sitting. ? Standing.  A blood pressure reading is recorded as two numbers, such as "120 over 80" (or 120/80). The first ("top") number is called the systolic pressure. It is a measure of the pressure in your arteries as your heart beats. The second ("bottom") number is called the diastolic pressure. It is a measure of the pressure in your arteries when your heart relaxes between beats. Blood pressure is measured in a unit called mm Hg. Healthy blood pressure for adults is 120/80. If your blood pressure is below 90/60, you may be diagnosed with hypotension. Other information or tests that may be used to diagnose orthostatic hypotension include:  Your other vital signs, such as your heart rate and temperature.  Blood tests.  Tilt table test. For this test, you will be safely secured to a table that moves you from a lying position to an upright position. Your heart rhythm and blood pressure will be monitored during the test.  How is this treated? Treatment for this condition may include:  Changing your diet. This may involve eating more salt (sodium) or drinking more water.  Taking medicines to raise your blood pressure.  Changing the dosage of certain medicines you are taking that might be lowering your blood pressure.  Wearing compression stockings. These stockings help to prevent blood clots and reduce swelling in your legs.  In some cases, you may need to go to the hospital for:    Fluid replacement. This means you will receive fluids through an IV tube.  Blood replacement. This means you will receive donated blood through an IV tube (transfusion).  Treating an infection or heart problems, if this applies.  Monitoring. You may need to be monitored while medicines that you  are taking wear off.  Follow these instructions at home: Eating and drinking   Drink enough fluid to keep your urine clear or pale yellow.  Eat a healthy diet and follow instructions from your health care provider about eating or drinking restrictions. A healthy diet includes: ? Fresh fruits and vegetables. ? Whole grains. ? Lean meats. ? Low-fat dairy products.  Eat extra salt only as directed. Do not add extra salt to your diet unless your health care provider told you to do that.  Eat frequent, small meals.  Avoid standing up suddenly after eating. Medicines  Take over-the-counter and prescription medicines only as told by your health care provider. ? Follow instructions from your health care provider about changing the dosage of your current medicines, if this applies. ? Do not stop or adjust any of your medicines on your own. General instructions  Wear compression stockings as told by your health care provider.  Get up slowly from lying down or sitting positions. This gives your blood pressure a chance to adjust.  Avoid hot showers and excessive heat as directed by your health care provider.  Return to your normal activities as told by your health care provider. Ask your health care provider what activities are safe for you.  Do not use any products that contain nicotine or tobacco, such as cigarettes and e-cigarettes. If you need help quitting, ask your health care provider.  Keep all follow-up visits as told by your health care provider. This is important. Contact a health care provider if:  You vomit.  You have diarrhea.  You have a fever for more than 2-3 days.  You feel more thirsty than usual.  You feel weak and tired. Get help right away if:  You have chest pain.  You have a fast or irregular heartbeat.  You develop numbness in any part of your body.  You cannot move your arms or your legs.  You have trouble speaking.  You become sweaty or feel  lightheaded.  You faint.  You feel short of breath.  You have trouble staying awake.  You feel confused. This information is not intended to replace advice given to you by your health care provider. Make sure you discuss any questions you have with your health care provider. Document Released: 08/08/2002 Document Revised: 05/06/2016 Document Reviewed: 02/08/2016 Elsevier Interactive Patient Education  2018 [employer]  

## 2017-02-05 NOTE — Progress Notes (Signed)
  Subjective:    CC: Feeling dizzy  HPI: This is a pleasant 41 year old male, he was working hard outside in the heat, drank a monster energy drink, eventually he started to become lightheaded and orthostatic with standing.  Overall the symptoms have improved significantly, still has a bit of lightheadedness.  Hyperlipidemia: Due to recheck.  Male hypogonadism: Was unable to tolerate Clomid, discontinued.  Past medical history:  Negative.  See flowsheet/record as well for more information.  Surgical history: Negative.  See flowsheet/record as well for more information.  Family history: Negative.  See flowsheet/record as well for more information.  Social history: Negative.  See flowsheet/record as well for more information.  Allergies, and medications have been entered into the medical record, reviewed, and no changes needed.   Review of Systems: No fevers, chills, night sweats, weight loss, chest pain, or shortness of breath.   Objective:    General: Well Developed, well nourished, and in no acute distress.  Neuro: Alert and oriented x3, extra-ocular muscles intact, sensation grossly intact.  HEENT: Normocephalic, atraumatic, pupils equal round reactive to light, neck supple, no masses, no lymphadenopathy, thyroid nonpalpable.  Skin: Warm and dry, no rashes. Cardiac: Regular rate and rhythm, no murmurs rubs or gallops, no lower extremity edema.  Respiratory: Clear to auscultation bilaterally. Not using accessory muscles, speaking in full sentences.  Impression and Recommendations:    Orthostatic hypotension Had been working outside in the heat profusely sweating, drinking energy, then developed some orthostasis with standing. Overall this is improving, orthostatic vitals are normal today. Elberta Fortis check some blood work including a urinalysis for specific gravity. Advised when he rehydrate himself he should not use simple water he try to use a salt-containing  beverage.  Hyperlipidemia Checking routine blood work as well.  Male hypogonadism Unable to tolerate Clomid, discontinuing this, no further treatment needed.

## 2017-02-05 NOTE — Assessment & Plan Note (Signed)
Checking routine blood work as well.

## 2017-02-06 ENCOUNTER — Encounter: Payer: Self-pay | Admitting: Sports Medicine

## 2017-02-06 DIAGNOSIS — F419 Anxiety disorder, unspecified: Secondary | ICD-10-CM

## 2017-02-06 LAB — TESTOSTERONE TOTAL,FREE,BIO, MALES
Albumin: 4.1 g/dL (ref 3.6–5.1)
Sex Hormone Binding: 39 nmol/L (ref 10–50)
Testosterone, Bioavailable: 80.3 ng/dL — ABNORMAL LOW (ref 110.0–575.0)
Testosterone, Free: 42.6 pg/mL — ABNORMAL LOW (ref 46.0–224.0)
Testosterone: 370 ng/dL (ref 250–827)

## 2017-02-06 LAB — HEMOGLOBIN A1C
Hgb A1c MFr Bld: 5.3 % (ref <5.7)
Mean Plasma Glucose: 105 mg/dL

## 2017-02-06 MED ORDER — ALPRAZOLAM 0.5 MG PO TABS
0.5000 mg | ORAL_TABLET | Freq: Two times a day (BID) | ORAL | 0 refills | Status: DC | PRN
Start: 2017-02-06 — End: 2017-06-01

## 2017-03-09 DIAGNOSIS — M9902 Segmental and somatic dysfunction of thoracic region: Secondary | ICD-10-CM | POA: Diagnosis not present

## 2017-03-09 DIAGNOSIS — M9903 Segmental and somatic dysfunction of lumbar region: Secondary | ICD-10-CM | POA: Diagnosis not present

## 2017-03-09 DIAGNOSIS — M542 Cervicalgia: Secondary | ICD-10-CM | POA: Diagnosis not present

## 2017-03-09 DIAGNOSIS — M545 Low back pain: Secondary | ICD-10-CM | POA: Diagnosis not present

## 2017-03-09 DIAGNOSIS — S161XXA Strain of muscle, fascia and tendon at neck level, initial encounter: Secondary | ICD-10-CM | POA: Diagnosis not present

## 2017-03-09 DIAGNOSIS — S39012A Strain of muscle, fascia and tendon of lower back, initial encounter: Secondary | ICD-10-CM | POA: Diagnosis not present

## 2017-03-09 DIAGNOSIS — M9901 Segmental and somatic dysfunction of cervical region: Secondary | ICD-10-CM | POA: Diagnosis not present

## 2017-03-24 ENCOUNTER — Ambulatory Visit (INDEPENDENT_AMBULATORY_CARE_PROVIDER_SITE_OTHER): Payer: BLUE CROSS/BLUE SHIELD | Admitting: Sports Medicine

## 2017-03-24 ENCOUNTER — Encounter: Payer: Self-pay | Admitting: Sports Medicine

## 2017-03-24 DIAGNOSIS — S39012A Strain of muscle, fascia and tendon of lower back, initial encounter: Secondary | ICD-10-CM | POA: Diagnosis not present

## 2017-03-24 DIAGNOSIS — M9901 Segmental and somatic dysfunction of cervical region: Secondary | ICD-10-CM | POA: Diagnosis not present

## 2017-03-24 DIAGNOSIS — G5602 Carpal tunnel syndrome, left upper limb: Secondary | ICD-10-CM

## 2017-03-24 DIAGNOSIS — M542 Cervicalgia: Secondary | ICD-10-CM | POA: Diagnosis not present

## 2017-03-24 DIAGNOSIS — S161XXA Strain of muscle, fascia and tendon at neck level, initial encounter: Secondary | ICD-10-CM | POA: Diagnosis not present

## 2017-03-24 NOTE — Progress Notes (Signed)
  Subjective:    CC: Left hand numbness and tingling  HPI: This is a pleasant 41 year old male, he has known carpal tunnel syndrome, having a recurrence of symptoms after his previous hydrodissection and requests repeat procedure. Symptoms are moderate, persistent.  Past medical history:  Negative.  See flowsheet/record as well for more information.  Surgical history: Negative.  See flowsheet/record as well for more information.  Family history: Negative.  See flowsheet/record as well for more information.  Social history: Negative.  See flowsheet/record as well for more information.  Allergies, and medications have been entered into the medical record, reviewed, and no changes needed.   Review of Systems: No fevers, chills, night sweats, weight loss, chest pain, or shortness of breath.   Objective:    General: Well Developed, well nourished, and in no acute distress.  Neuro: Alert and oriented x3, extra-ocular muscles intact, sensation grossly intact.  HEENT: Normocephalic, atraumatic, pupils equal round reactive to light, neck supple, no masses, no lymphadenopathy, thyroid nonpalpable.  Skin: Warm and dry, no rashes. Cardiac: Regular rate and rhythm, no murmurs rubs or gallops, no lower extremity edema.  Respiratory: Clear to auscultation bilaterally. Not using accessory muscles, speaking in full sentences.  Procedure: Real-time Ultrasound Guided Injection of left median nerve hydrodissection Device: GE Logiq E  Verbal informed consent obtained.  Time-out conducted.  Noted no overlying erythema, induration, or other signs of local infection.  Skin prepped in a sterile fashion.  Local anesthesia: Topical Ethyl chloride.  With sterile technique and under real time ultrasound guidance:  Injected medication both superficial to and deep to the median nerve in the carpal tunnel for a total of 1 mL Kenalog 40, 5 mL lidocaine. Completed without difficulty  Pain immediately resolved  suggesting accurate placement of the medication.  Advised to call if fevers/chills, erythema, induration, drainage, or persistent bleeding.  Images permanently stored and available for review in the ultrasound unit.  Impression: Technically successful ultrasound guided injection.  Impression and Recommendations:    Bilateral carpal tunnel syndrome Left median nerve hydrodissection as above, return as needed.

## 2017-03-24 NOTE — Assessment & Plan Note (Signed)
Left median nerve hydrodissection as above, return as needed.

## 2017-04-14 ENCOUNTER — Emergency Department
Admission: EM | Admit: 2017-04-14 | Discharge: 2017-04-14 | Disposition: A | Discharge status: Home / Self Care | Payer: BLUE CROSS/BLUE SHIELD | Source: Home / Self Care | Attending: Family Medicine | Admitting: Family Medicine

## 2017-04-14 ENCOUNTER — Emergency Department (INDEPENDENT_AMBULATORY_CARE_PROVIDER_SITE_OTHER): Payer: BLUE CROSS/BLUE SHIELD

## 2017-04-14 DIAGNOSIS — R0981 Nasal congestion: Secondary | ICD-10-CM

## 2017-04-14 DIAGNOSIS — J0101 Acute recurrent maxillary sinusitis: Secondary | ICD-10-CM | POA: Diagnosis not present

## 2017-04-14 DIAGNOSIS — J329 Chronic sinusitis, unspecified: Secondary | ICD-10-CM | POA: Diagnosis not present

## 2017-04-14 MED ORDER — PREDNISONE 20 MG PO TABS: ORAL_TABLET | ORAL | 0 refills | Status: DC

## 2017-04-14 MED ORDER — AMOXICILLIN 875 MG PO TABS: 875.0000 mg | ORAL_TABLET | Freq: Two times a day (BID) | ORAL | 0 refills | Status: DC

## 2017-04-14 NOTE — ED Triage Notes (Signed)
Pt has been sick for about 2 weeks.  Started with a sore throat, felt bad over the weekend, and yesterday and today has a headache, pressure and yellow phlegm.

## 2017-04-14 NOTE — Discharge Instructions (Signed)
Try plain guaifenesin (1200mg  extended release tabs such as Mucinex) twice daily, with plenty of water, for congestion.  May add Pseudoephedrine (30mg , one or two every 4 to 6 hours).  Get adequate rest.   May use Afrin nasal spray (or generic oxymetazoline) each morning for about 5 days and then discontinue.  Also recommend using saline nasal spray several times daily and saline nasal irrigation (AYR is a common brand).  Use Flonase nasal spray each morning after using Afrin nasal spray and saline nasal irrigation.   Stop all antihistamines for now, and other non-prescription cough/cold preparations.

## 2017-04-14 NOTE — ED Provider Notes (Signed)
[name]    CSN: [id number] Arrival date & time: 04/14/17  1103     History   Chief Complaint Chief Complaint  Patient presents with  . Headache  . Nasal Congestion    HPI Brian Spears is a 41 y.o. male.   About two weeks ago patient developed a sore throat, sinus congestion, myalgias, chills, and headache but no cough.  During the past four days he has developed increased sinus congestion with facial pain.  His symptoms have not responded to Claritin and pseudophed. He has a history of sinusitis.   The history is provided by the patient.    Past Medical History:  Diagnosis Date  . Hand fracture   . Hyperlipidemia   . Lyme disease   . Normal cardiac stress test 4/10   at [facility] - negative.  terminated for fatigue    Patient Active Problem List   Diagnosis Date Noted  . Orthostatic hypotension 02/05/2017  . Flushing 09/22/2016  . Eustachian tube dysfunction 06/09/2016  . Medial epicondylitis of right elbow 02/18/2016  . Painful ejaculation 01/21/2016  . Onychodystrophy 12/05/2014  . Primary osteoarthritis of both elbows 12/05/2014  . Anxiety 08/18/2013  . Elevated hemoglobin (Delta) 08/01/2013  . Male hypogonadism 06/01/2013  . GERD (gastroesophageal reflux disease) 03/01/2013  . Epigastric pain 07/08/2012  . History of smoking 06/07/2012  . Annual physical exam 06/07/2012  . Celiac disease 06/30/2011  . Hyperlipidemia 09/27/2010  . Bilateral carpal tunnel syndrome 09/27/2010    Past Surgical History:  Procedure Laterality Date  . surgery on right hand         Home Medications    Prior to Admission medications   Medication Sig Start Date End Date Taking? Authorizing Provider  ALPRAZolam Duanne Moron) 0.5 MG tablet Take 1 tablet (0.5 mg total) by mouth 2 (two) times daily as needed for anxiety. 02/06/17   Silverio Decamp, MD  amoxicillin (AMOXIL) 875 MG tablet Take 1 tablet (875 mg total) by mouth 2 (two) times daily. 04/14/17   Kandra Nicolas, MD  fluticasone (FLONASE) 50 MCG/ACT nasal spray One spray in each nostril twice a day, use left hand for right nostril, and right hand for left nostril. 06/09/16   Silverio Decamp, MD  loratadine (CLARITIN) 10 MG tablet Take 10 mg by mouth daily.    [provider]  predniSONE (DELTASONE) 20 MG tablet Take one tab by mouth twice daily for 4 days, then one daily for 3 days. Take with food. 04/14/17   Kandra Nicolas, MD    Family History Family History  Problem Relation Age of Onset  . Breast cancer Mother   . Hyperlipidemia Mother   . Hypertension Father   . Hyperlipidemia Father   . Other Other        grandfather with alcoholism  . Heart attack Other        grandmother  . Diabetes Other        grandmother  . Colon cancer Other        uncle  . Breast cancer Other        aunt  . Lung cancer Other        grandmother  . Prostate cancer Other        grandfather  . Melanoma Other        uncle    Social History Social History  Substance Use Topics  . Smoking status: Former Smoker    Packs/day: 0.20  Types: Cigarettes  . Smokeless tobacco: Former Systems developer    Types: Snuff    Quit date: 06/02/2011     Comment: started smoking at age 54, started smokeless tobacco at age 26  . Alcohol use No     Allergies   Sulfa antibiotics and Naproxen sodium   Review of Systems Review of Systems + sore throat No cough No pleuritic pain No wheezing + nasal congestion + post-nasal drainage + sinus pain/pressure No itchy/red eyes ? earache No hemoptysis No SOB No fever, + chills No nausea No vomiting No abdominal pain + loose stools No urinary symptoms No skin rash + fatigue + myalgias + headache Used OTC meds without relief   Physical Exam Triage Vital Signs ED Triage Vitals  Enc Vitals Group     BP 04/14/17 1130 110/73     Pulse Rate 04/14/17 1130 79     Resp --      Temp 04/14/17 1130 98.2 F (36.8 C)     Temp src --      SpO2 04/14/17  1130 98 %     Weight 04/14/17 1130 188 lb (85.3 kg)     Height 04/14/17 1130 5\' 8"  (1.727 m)     Head Circumference --      Peak Flow --      Pain Score 04/14/17 1131 8     Pain Loc --      Pain Edu? --      Excl. in [place]? --    No data found.   Updated Vital Signs BP 110/73 (BP Location: Left Arm)   Pulse 79   Temp 98.2 F (36.8 C)   Ht 5\' 8"  (1.727 m)   Wt 188 lb (85.3 kg)   SpO2 98%   BMI 28.59 kg/m   Visual Acuity Right Eye Distance:   Left Eye Distance:   Bilateral Distance:    Right Eye Near:   Left Eye Near:    Bilateral Near:     Physical Exam  Constitutional: He appears well-developed and well-nourished. No distress.  HENT:  Head: Normocephalic.    Right Ear: Tympanic membrane, external ear and ear canal normal.  Left Ear: Tympanic membrane, external ear and ear canal normal.  Nose: Right sinus exhibits maxillary sinus tenderness. Left sinus exhibits maxillary sinus tenderness.  Mouth/Throat: Oropharynx is clear and moist.  Left maxillary sinus is more tender to palpation than the right.  Eyes: Pupils are equal, round, and reactive to light. Conjunctivae and EOM are normal.  Neck: Neck supple.  Cardiovascular: Normal heart sounds.   Pulmonary/Chest: Breath sounds normal.  Abdominal: There is no tenderness.  Musculoskeletal: He exhibits no edema.  Lymphadenopathy:    He has no cervical adenopathy.  Neurological: He is alert.  Nursing note and vitals reviewed.    UC Treatments / Results  Labs (all labs ordered are listed, but only abnormal results are displayed) Labs Reviewed - No data to display  EKG  EKG Interpretation None       Radiology Dg Sinuses Complete  Result Date: 04/14/2017 CLINICAL DATA:  Sinus congestion. Tenderness over maxillary sinuses. EXAM: PARANASAL SINUSES - COMPLETE 3 + VIEW COMPARISON:  CT 03/03/2013. FINDINGS: Air-fluid level noted in the left maxillary sinus consistent with sinusitis. The remaining sinuses are  clear. Mastoids are clear. No acute bony abnormality . IMPRESSION: Air-fluid level in the left maxillary sinus consistent with sinusitis. Electronically Signed   By: Marcello Moores  Register   On: 04/14/2017 12:14  Procedures Procedures (including critical care time)  Medications Ordered in UC Medications - No data to display   Initial Impression / Assessment and Plan / UC Course  I have reviewed the triage vital signs and the nursing notes.  Pertinent labs & imaging results that were available during my care of the patient were reviewed by me and considered in my medical decision making (see chart for details).    Begin amoxicillin, and prednisone burst/taper. Try plain guaifenesin (1200mg  extended release tabs such as Mucinex) twice daily, with plenty of water, for congestion.  May add Pseudoephedrine (30mg , one or two every 4 to 6 hours).  Get adequate rest.   May use Afrin nasal spray (or generic oxymetazoline) each morning for about 5 days and then discontinue.  Also recommend using saline nasal spray several times daily and saline nasal irrigation (AYR is a common brand).  Use Flonase nasal spray each morning after using Afrin nasal spray and saline nasal irrigation.   Stop all antihistamines for now, and other non-prescription cough/cold preparations. Followup with ENT if not improved 7 to 10 days.    Final Clinical Impressions(s) / UC Diagnoses   Final diagnoses:  Acute recurrent maxillary sinusitis    New Prescriptions New Prescriptions   AMOXICILLIN (AMOXIL) 875 MG TABLET    Take 1 tablet (875 mg total) by mouth 2 (two) times daily.   PREDNISONE (DELTASONE) 20 MG TABLET    Take one tab by mouth twice daily for 4 days, then one daily for 3 days. Take with food.         Kandra Nicolas, MD 04/22/17 1200

## 2017-05-14 ENCOUNTER — Encounter: Payer: Self-pay | Admitting: Sports Medicine

## 2017-05-14 MED ORDER — PREDNISONE 50 MG PO TABS: ORAL_TABLET | ORAL | 0 refills | Status: DC

## 2017-05-18 ENCOUNTER — Encounter: Payer: Self-pay | Admitting: Sports Medicine

## 2017-05-26 ENCOUNTER — Encounter: Payer: Self-pay | Admitting: Sports Medicine

## 2017-05-26 ENCOUNTER — Telehealth: Payer: Self-pay | Admitting: Sports Medicine

## 2017-05-26 DIAGNOSIS — M2550 Pain in unspecified joint: Secondary | ICD-10-CM | POA: Insufficient documentation

## 2017-05-26 NOTE — Assessment & Plan Note (Signed)
Persistent widespread arthralgias and myalgias that seem to be improved considerably when on prednisone. Pulling the trigger for rheumatoid workup.

## 2017-05-26 NOTE — Telephone Encounter (Signed)
Increasing myalgias and arthralgias, improved considerably when on steroids, no rheumatoid workup done, I'm going to add a full rheumatoid workup.

## 2017-05-27 ENCOUNTER — Other Ambulatory Visit: Payer: Self-pay | Admitting: Sports Medicine

## 2017-05-27 DIAGNOSIS — M2550 Pain in unspecified joint: Secondary | ICD-10-CM | POA: Diagnosis not present

## 2017-05-27 NOTE — Addendum Note (Signed)
Addended by: Silverio Decamp on: 05/27/2017 08:53 AM   Modules accepted: Orders

## 2017-05-29 ENCOUNTER — Encounter: Payer: Self-pay | Admitting: Sports Medicine

## 2017-05-29 NOTE — Telephone Encounter (Signed)
Pt says he had labs done, I don't see any evidence in epic that this happened.  Would you call the lab and see whats up?

## 2017-06-01 ENCOUNTER — Ambulatory Visit (INDEPENDENT_AMBULATORY_CARE_PROVIDER_SITE_OTHER): Payer: BLUE CROSS/BLUE SHIELD | Admitting: Sports Medicine

## 2017-06-01 ENCOUNTER — Encounter: Payer: Self-pay | Admitting: Sports Medicine

## 2017-06-01 DIAGNOSIS — Z23 Encounter for immunization: Secondary | ICD-10-CM

## 2017-06-01 DIAGNOSIS — M2550 Pain in unspecified joint: Secondary | ICD-10-CM

## 2017-06-01 LAB — HLA-B27 ANTIGEN: HLA-B27 Antigen: NEGATIVE

## 2017-06-01 NOTE — Progress Notes (Signed)
  Subjective:    CC: Follow-up  HPI: Brian Spears returns for follow-up of his blood work, he stopped me at the [place], and asked about multiple joint aches, he asked to have some room to avoid testing done. This was done and is completely negative. On further questioning he did have some upper rest of her symptoms, overall things are starting to improve.  Past medical history:  Negative.  See flowsheet/record as well for more information.  Surgical history: Negative.  See flowsheet/record as well for more information.  Family history: Negative.  See flowsheet/record as well for more information.  Social history: Negative.  See flowsheet/record as well for more information.  Allergies, and medications have been entered into the medical record, reviewed, and no changes needed.   Review of Systems: No fevers, chills, night sweats, weight loss, chest pain, or shortness of breath.   Objective:    General: Well Developed, well nourished, and in no acute distress.  Neuro: Alert and oriented x3, extra-ocular muscles intact, sensation grossly intact.  HEENT: Normocephalic, atraumatic, pupils equal round reactive to light, neck supple, no masses, no lymphadenopathy, thyroid nonpalpable.  Skin: Warm and dry, no rashes. Cardiac: Regular rate and rhythm, no murmurs rubs or gallops, no lower extremity edema.  Respiratory: Clear to auscultation bilaterally. Not using accessory muscles, speaking in full sentences.  Impression and Recommendations:    Arthralgia All rheumatoid labs are negative, he does have some upper rest for symptoms making this likely simply a viral process. Over the counter analgesics, return as needed.  I spent 25 minutes with this patient, greater than 50% was face-to-face time counseling regarding the above diagnoses ___________________________________________ Gwen Her. Dianah Field, M.D., ABFM., CAQSM. Primary Care and [place] Instructor of [employer] [facility] [employer]

## 2017-06-01 NOTE — Assessment & Plan Note (Signed)
All rheumatoid labs are negative, he does have some upper rest for symptoms making this likely simply a viral process. Over the counter analgesics, return as needed.

## 2017-06-01 NOTE — Patient Instructions (Signed)
Viral Illness, Adult Viruses are tiny germs that can get into a person's body and cause illness. There are many different types of viruses, and they cause many types of illness. Viral illnesses can range from mild to severe. They can affect various parts of the body. Common illnesses that are caused by a virus include colds and the flu. Viral illnesses also include serious conditions such as HIV/AIDS (human immunodeficiency virus/acquired immunodeficiency syndrome). A few viruses have been linked to certain cancers. What are the causes? Many types of viruses can cause illness. Viruses invade cells in your body, multiply, and cause the infected cells to malfunction or die. When the cell dies, it releases more of the virus. When this happens, you develop symptoms of the illness, and the virus continues to spread to other cells. If the virus takes over the function of the cell, it can cause the cell to divide and grow out of control, as is the case when a virus causes cancer. Different viruses get into the body in different ways. You can get a virus by:  Swallowing food or water that is contaminated with the virus.  Breathing in droplets that have been coughed or sneezed into the air by an infected person.  Touching a surface that has been contaminated with the virus and then touching your eyes, nose, or mouth.  Being bitten by an insect or animal that carries the virus.  Having sexual contact with a person who is infected with the virus.  Being exposed to blood or fluids that contain the virus, either through an open cut or during a transfusion.  If a virus enters your body, your body's defense system (immune system) will try to fight the virus. You may be at higher risk for a viral illness if your immune system is weak. What are the signs or symptoms? Symptoms vary depending on the type of virus and the location of the cells that it invades. Common symptoms of the main types of viral illnesses  include: Cold and flu viruses  Fever.  Headache.  Sore throat.  Muscle aches.  Nasal congestion.  Cough. Digestive system (gastrointestinal) viruses  Fever.  Abdominal pain.  Nausea.  Diarrhea. Liver viruses (hepatitis)  Loss of appetite.  Tiredness.  Yellowing of the skin (jaundice). Brain and spinal cord viruses  Fever.  Headache.  Stiff neck.  Nausea and vomiting.  Confusion or sleepiness. Skin viruses  Warts.  Itching.  Rash. Sexually transmitted viruses  Discharge.  Swelling.  Redness.  Rash. How is this treated? Viruses can be difficult to treat because they live within cells. Antibiotic medicines do not treat viruses because these drugs do not get inside cells. Treatment for a viral illness may include:  Resting and drinking plenty of fluids.  Medicines to relieve symptoms. These can include over-the-counter medicine for pain and fever, medicines for cough or congestion, and medicines to relieve diarrhea.  Antiviral medicines. These drugs are available only for certain types of viruses. They may help reduce flu symptoms if taken early. There are also many antiviral medicines for hepatitis and HIV/AIDS.  Some viral illnesses can be prevented with vaccinations. A common example is the flu shot. Follow these instructions at home: Medicines   Take over-the-counter and prescription medicines only as told by your health care provider.  If you were prescribed an antiviral medicine, take it as told by your health care provider. Do not stop taking the medicine even if you start to feel better.  Be aware   of when antibiotics are needed and when they are not needed. Antibiotics do not treat viruses. If your health care provider thinks that you may have a bacterial infection as well as a viral infection, you may get an antibiotic. ? Do not ask for an antibiotic prescription if you have been diagnosed with a viral illness. That will not make your  illness go away faster. ? Frequently taking antibiotics when they are not needed can lead to antibiotic resistance. When this develops, the medicine no longer works against the bacteria that it normally fights. General instructions  Drink enough fluids to keep your urine clear or pale yellow.  Rest as much as possible.  Return to your normal activities as told by your health care provider. Ask your health care provider what activities are safe for you.  Keep all follow-up visits as told by your health care provider. This is important. How is this prevented? Take these actions to reduce your risk of viral infection:  Eat a healthy diet and get enough rest.  Wash your hands often with soap and water. This is especially important when you are in public places. If soap and water are not available, use hand sanitizer.  Avoid close contact with friends and family who have a viral illness.  If you travel to areas where viral gastrointestinal infection is common, avoid drinking water or eating raw food.  Keep your immunizations up to date. Get a flu shot every year as told by your health care provider.  Do not share toothbrushes, nail clippers, razors, or needles with other people.  Always practice safe sex.  Contact a health care provider if:  You have symptoms of a viral illness that do not go away.  Your symptoms come back after going away.  Your symptoms get worse. Get help right away if:  You have trouble breathing.  You have a severe headache or a stiff neck.  You have severe vomiting or abdominal pain. This information is not intended to replace advice given to you by your health care provider. Make sure you discuss any questions you have with your health care provider. Document Released: 12/28/2015 Document Revised: 01/30/2016 Document Reviewed: 12/28/2015 Elsevier Interactive Patient Education  2018 [employer].  

## 2017-06-06 LAB — ANA, IFA COMPREHENSIVE PANEL
Anti Nuclear Antibody(ANA): NEGATIVE
ENA SM Ab Ser-aCnc: <1 AI
SM/RNP: <1 AI
SSA (Ro) (ENA) Antibody, IgG: <1 AI
SSB (La) (ENA) Antibody, IgG: <1 AI
Scleroderma (Scl-70) (ENA) Antibody, IgG: <1 AI
ds DNA Ab: 1 [IU]/mL

## 2017-06-06 LAB — HLA-B27 ANTIGEN: HLA-B27 Antigen: NEGATIVE

## 2017-06-06 LAB — RHEUMATOID ARTHRITIS DIAGNOSTIC PANEL, COMPREHENSIVE
Cyclic Citrullin Peptide Ab: <16 U (ref <20)
Rheumatoid Factor (IgA): 9 U — ABNORMAL HIGH (ref <=6)
Rheumatoid Factor (IgG): 8 U — ABNORMAL HIGH (ref <=6)
Rheumatoid Factor (IgM): 11 U — ABNORMAL HIGH (ref <=6)
SSA (Ro) (ENA) Antibody, IgG: <1 AI (ref <1.0)
SSB (La) (ENA) Antibody, IgG: <1 AI (ref <1.0)

## 2017-06-06 LAB — SEDIMENTATION RATE: Sed Rate: 2 mm/h (ref 0–15)

## 2017-06-06 LAB — CK: Total CK: 106 U/L (ref 44–196)

## 2017-06-06 LAB — URIC ACID: Uric Acid, Serum: 4.6 mg/dL (ref 4.0–8.0)

## 2017-07-02 ENCOUNTER — Encounter: Payer: Self-pay | Admitting: Sports Medicine

## 2017-07-02 ENCOUNTER — Ambulatory Visit (INDEPENDENT_AMBULATORY_CARE_PROVIDER_SITE_OTHER): Payer: BLUE CROSS/BLUE SHIELD | Admitting: Sports Medicine

## 2017-07-02 DIAGNOSIS — M25511 Pain in right shoulder: Secondary | ICD-10-CM

## 2017-07-02 DIAGNOSIS — G8929 Other chronic pain: Secondary | ICD-10-CM

## 2017-07-02 DIAGNOSIS — M19022 Primary osteoarthritis, left elbow: Secondary | ICD-10-CM | POA: Diagnosis not present

## 2017-07-02 DIAGNOSIS — G5602 Carpal tunnel syndrome, left upper limb: Secondary | ICD-10-CM | POA: Diagnosis not present

## 2017-07-02 DIAGNOSIS — M19021 Primary osteoarthritis, right elbow: Secondary | ICD-10-CM | POA: Diagnosis not present

## 2017-07-02 NOTE — Assessment & Plan Note (Signed)
Left median nerve hydrodissection, previous injection provided almost 4 months of relief. At this point I think we do need to strongly consider carpal tunnel release, he will contact his hand surgeon.

## 2017-07-02 NOTE — Assessment & Plan Note (Addendum)
Right elbow joint injection as above. Previous injection provided over 1 year of relief.

## 2017-07-02 NOTE — Assessment & Plan Note (Addendum)
Benign exam, rehab exercises given. Return as needed.

## 2017-07-02 NOTE — Progress Notes (Signed)
Subjective:    CC: Multiple issues  HPI: Carpal tunnel syndrome: Previous injection provided almost 4 months of relief, recurrence of symptoms.  Agreeable at this point to proceed with surgical intervention.  Injections do not all the last 3-4 months.  Right elbow pain: History of elbow osteoarthritis, previous injection into the joint was approximately a year and a half ago, desires repeat injection, pain is moderate, persistent, localized without radiation.  Right shoulder pain: Over the deltoid, worse with overhead activities, no injury, mild  Past medical history:  Negative.  See flowsheet/record as well for more information.  Surgical history: Negative.  See flowsheet/record as well for more information.  Family history: Negative.  See flowsheet/record as well for more information.  Social history: Negative.  See flowsheet/record as well for more information.  Allergies, and medications have been entered into the medical record, reviewed, and no changes needed.   Review of Systems: No fevers, chills, night sweats, weight loss, chest pain, or shortness of breath.   Objective:    General: Well Developed, well nourished, and in no acute distress.  Neuro: Alert and oriented x3, extra-ocular muscles intact, sensation grossly intact.  HEENT: Normocephalic, atraumatic, pupils equal round reactive to light, neck supple, no masses, no lymphadenopathy, thyroid nonpalpable.  Skin: Warm and dry, no rashes. Cardiac: Regular rate and rhythm, no murmurs rubs or gallops, no lower extremity edema.  Respiratory: Clear to auscultation bilaterally. Not using accessory muscles, speaking in full sentences. Right shoulder: Inspection reveals no abnormalities, atrophy or asymmetry. Palpation is normal with no tenderness over AC joint or bicipital groove. ROM is full in all planes. Rotator cuff strength normal throughout. No signs of impingement with negative Neer and Hawkin's tests, empty can. Speeds  and Yergason's tests normal. No labral pathology noted with negative Obrien's, negative crank, negative clunk, and good stability. Normal scapular function observed. No painful arc and no drop arm sign. No apprehension sign  Procedure: Real-time Ultrasound Guided hydrodissection of the left median nerve at the carpal tunnel Device: GE Logiq E  Verbal informed consent obtained.  Time-out conducted.  Noted no overlying erythema, induration, or other signs of local infection.  Skin prepped in a sterile fashion.  Local anesthesia: Topical Ethyl chloride.  With sterile technique and under real time ultrasound guidance: Taking care to avoid intraneural injection I placed 1 cc kenalog 40, 5 cc lidocaine superficial to and deep to the median nerve freeing it from surrounding structures.  Some medication was directed deep into the carpal tunnel as well. Completed without difficulty  Pain immediately resolved suggesting accurate placement of the medication.  Advised to call if fevers/chills, erythema, induration, drainage, or persistent bleeding.  Images permanently stored and available for review in the ultrasound unit.  Impression: Technically successful ultrasound guided injection.  Procedure: Real-time Ultrasound Guided Injection of right elbow joint Device: GE Logiq E  Verbal informed consent obtained.  Time-out conducted.  Noted no overlying erythema, induration, or other signs of local infection.  Skin prepped in a sterile fashion.  Local anesthesia: Topical Ethyl chloride.  With sterile technique and under real time ultrasound guidance: I advanced the needle through the anconeus muscle into the elbow joint and injected 1 cc kenalog 40, 1 cc lidocaine, 1 cc bupivacaine Completed without difficulty  Pain immediately resolved suggesting accurate placement of the medication.  Advised to call if fevers/chills, erythema, induration, drainage, or persistent bleeding.  Images permanently stored  and available for review in the ultrasound unit.  Impression: Technically successful  ultrasound guided injection.  Impression and Recommendations:    Bilateral carpal tunnel syndrome Left median nerve hydrodissection, previous injection provided almost 4 months of relief. At this point I think we do need to strongly consider carpal tunnel release, he will contact his hand surgeon.  Primary osteoarthritis of both elbows Right elbow joint injection as above. Previous injection provided over 1 year of relief.  Right shoulder pain Benign exam, rehab exercises given. Return as needed.  ___________________________________________ Gwen Her. Dianah Field, M.D., ABFM., CAQSM. Primary Care and [place] Instructor of [place] [facility]

## 2017-08-31 ENCOUNTER — Other Ambulatory Visit: Payer: Self-pay

## 2017-08-31 MED ORDER — TRIAMCINOLONE ACETONIDE 0.5 % EX CREA: 1.0000 "application " | TOPICAL_CREAM | Freq: Two times a day (BID) | CUTANEOUS | 3 refills | Status: DC

## 2017-09-02 ENCOUNTER — Ambulatory Visit: Payer: BLUE CROSS/BLUE SHIELD | Admitting: Family Medicine

## 2017-09-02 ENCOUNTER — Encounter: Payer: Self-pay | Admitting: Family Medicine

## 2017-09-02 VITALS — BP 123/75 | HR 72 | Ht 68.0 in | Wt 200.0 lb

## 2017-09-02 DIAGNOSIS — G5602 Carpal tunnel syndrome, left upper limb: Secondary | ICD-10-CM | POA: Diagnosis not present

## 2017-09-02 DIAGNOSIS — M7701 Medial epicondylitis, right elbow: Secondary | ICD-10-CM

## 2017-09-02 NOTE — Patient Instructions (Signed)
Thank you for coming in today. You should hear about the nerve study for the left wrist soon.  Let me know if you do not hear anything.   Do the exercises as much as you can for golfer's elbow.   Recheck as needed.    Golfer's Elbow Golfer's elbow, also called medial epicondylitis, is a condition that results from inflammation of the strong bands of tissue (tendons) that attach your forearm muscles to the inside of your bone at the elbow. These tendons affect the muscles that bend the palm toward the wrist (flexion). This condition is called golfer's elbow because it is more common among people who constantly bend and twist their wrists, such as golfers. This injury usually results from overuse. Tendons also become less flexible with age. This condition causes elbow pain that may spread to your forearm and upper arm. The pain may get worse when you bend your wrist downward. What are the causes? This condition is an overuse injury that is caused by:  Repeatedly flexing, turning, or twisting your wrist.  Constantly gripping objects with your hands.  What increases the risk? This condition is more likely to develop in people who play golf or tennis or have jobs that require the constant use of their hands. This injury is more common among:  Carpenters.  Gardeners.  Musicians.  Bricklayers.  Typists.  What are the signs or symptoms? Symptoms of this condition include:  Pain near the inner elbow or forearm.  Reduced grip strength.  How is this diagnosed? This condition is diagnosed based on your symptoms, medical history, and physical exam. During the exam, your health care provider may test your grip strength and move your wrist to check for pain. You may also have an MRI to confirm the diagnosis, look for other issues, and check for tears in the ligaments, muscles, or tendons. How is this treated? Treatment for this condition includes:  Stopping all activities that make you  bend or twist your wrist until your pain and other symptoms go away.  Icing your wrist to relieve pain.  Taking NSAIDs or getting corticosteroid injections to reduce pain and swelling.  Doing stretches, range-of-motion, and strengthening exercises (physical therapy) as told by your health care provider.  In rare cases, surgery may be needed if your condition does not improve. Follow these instructions at home:  If directed, apply ice to the injured area. ? Put ice in a plastic bag. ? Place a towel between your skin and the bag. ? Leave the ice on for 20 minutes, 2-3 times a day.  Move your fingers often to avoid stiffness.  Raise (elevate) the injured area above the level of your heart while you are sitting or lying down.  Return to your normal activities as told by your health care provider. Ask your health care provider what activities are safe for you.  Do exercises as told by your health care provider.  Do not use tobacco products, including cigarettes, chewing tobacco, or e-cigarettes. If you need help quitting, ask your health care provider.  Take over-the-counter and prescription medicines only as told by your health care provider.  Keep all follow-up visits as told by your health care provider. This is important. How is this prevented?  Warm up and stretch before being active.  Cool down and stretch after being active.  Give your body time to rest between periods of activity.  Make sure to use equipment that fits you.  Be safe and responsible while  being active to avoid falls.  Do at least 150 minutes of moderate-intensity exercise each week, such as brisk walking or water aerobics.  Maintain physical fitness, including: ? Strength. ? Flexibility. ? Cardiovascular fitness. ? Endurance.  Perform exercises to strengthen the forearm muscles.  Slow your golf swing to reduce shock in the arm when making contact with the ball, if you play golf. Contact a health  care provider if:  Your pain does not improve or it gets worse.  You notice numbness in your hand. Get help right away if:  Your pain is severe.  You cannot move your wrist. This information is not intended to replace advice given to you by your health care provider. Make sure you discuss any questions you have with your health care provider. Document Released: 08/18/2005 Document Revised: 04/22/2016 Document Reviewed: 04/30/2015 Elsevier Interactive Patient Education  [employer].

## 2017-09-02 NOTE — Progress Notes (Signed)
Brian Spears is a 42 y.o. male who presents to [facility] today for right elbow pain, left carpal tunnel syndrome.   [name] notes pain in the right medial elbow for several weeks. The pain is worse with activity and better with rest.  He has a history of right elbow DJD and had a steroid injection into the right elbow about 2 months ago which provided pretty good relief of pain.  He notes the pain is a little different located at the medial elbow worse with grip and wrist flexion.  He denies any injury.  He suspects he may have medial epicondylitis.  He works as a Chief Strategy Officer.  Left carpal tunnel syndrome: [name] notes tingling and pain and numbness to the first 3 digits of the left hand.  This is consistent with carpal tunnel syndrome.  He has a history of bilateral carpal tunnel syndrome status post surgery on the right in 2007 and repeated injections on the left most recently 2 months ago.  He denies any weakness or loss of function.   Past Medical History:  Diagnosis Date  . Hand fracture   . Hyperlipidemia   . Lyme disease   . Normal cardiac stress test 4/10   at [facility] - negative.  terminated for fatigue   Past Surgical History:  Procedure Laterality Date  . surgery on right hand     Social History   Tobacco Use  . Smoking status: Former Smoker    Packs/day: 0.20    Types: Cigarettes  . Smokeless tobacco: Former Systems developer    Types: Snuff    Quit date: 06/02/2011  . Tobacco comment: started smoking at age 75, started smokeless tobacco at age 72  Substance Use Topics  . Alcohol use: No     ROS:  As above   Medications: Current Outpatient Medications  Medication Sig Dispense Refill  . fluticasone (FLONASE) 50 MCG/ACT nasal spray One spray in each nostril twice a day, use left hand for right nostril, and right hand for left nostril. 48 g 3  . OMEGA-3 FATTY ACIDS PO Take by mouth.    . Probiotic Product (PROBIOTIC DAILY PO) Take by mouth.     . triamcinolone cream (KENALOG) 0.5 % Apply 1 application topically 2 (two) times daily. To affected areas. 30 g 3   No current facility-administered medications for this visit.    Allergies  Allergen Reactions  . Sulfa Antibiotics Other (See Comments)    headaches  . Naproxen Sodium      Exam:  BP 123/75   Pulse 72   Ht 5\' 8"  (1.727 m)   Wt 200 lb (90.7 kg)   BMI 30.41 kg/m  General: Well Developed, well nourished, and in no acute distress.  Neuro/Psych: Alert and oriented x3, extra-ocular muscles intact, able to move all 4 extremities, sensation grossly intact. Skin: Warm and dry, no rashes noted.  Respiratory: Not using accessory muscles, speaking in full sentences, trachea midline.  Cardiovascular: Pulses palpable, no extremity edema. Abdomen: Does not appear distended. MSK:  Right elbow normal-appearing no erythema or swelling. Tender palpation medial epicondyles. Pain with resisted grip and wrist flexion. Pulses capillary refill and sensation intact distally.  Left wrist normal-appearing nontender no thenar atrophy no area Positive Tinel's over the carpal tunnel. Normal grip strength pulses and capillary refill and sensation are intact   Limited musculoskeletal ultrasound of the right elbow medial epicondyle reveals hyperechoic change at the tip of the epicondyle consistent with  medial epicondylitis.  Intact flexion insertion.  Normal bony structures.  Limited musculoskeletal ultrasound of the left wrist reveals an enlarged median nerve with normal bony and tendinous structures.  Consistent with carpal tunnel syndrome.  Procedure: Real-time Ultrasound Guided Injection of right medial epicondyle Device: GE Logiq E  Images permanently stored and available for review in the ultrasound unit. Verbal informed consent obtained. Discussed risks and benefits of procedure. Warned about infection bleeding damage to structures skin hypopigmentation and fat atrophy among  others. Patient expresses understanding and agreement Time-out conducted.  Noted no overlying erythema, induration, or other signs of local infection.  Skin prepped in a sterile fashion.  Local anesthesia: Topical Ethyl chloride.  With sterile technique and under real time ultrasound guidance:40 mg of Kenalog and 1 mL of lidocaine injected easily.  Completed without difficulty  Pain immediately resolved suggesting accurate placement of the medication.  Advised to call if fevers/chills, erythema, induration, drainage, or persistent bleeding.  Images permanently stored and available for review in the ultrasound unit.  Impression: Technically successful ultrasound guided injection.   Procedure: Real-time Ultrasound Guided hydrodissection of the left median nerve at the carpal tunnel Device: GE Logiq E  Verbal informed consent obtained.  Time-out conducted.  Noted no overlying erythema, induration, or other signs of local infection.  Skin prepped in a sterile fashion.  Local anesthesia: Topical Ethyl chloride.  With sterile technique and under real time ultrasound guidance: Taking care to avoid intraneural injection I placed  kenalog 40mg  , 1 ml lidocaine superficial to and deep to the median nerve freeing it from surrounding structures.  Some medication was directed deep into the carpal tunnel as well. Completed without difficulty  Pain immediately resolved suggesting accurate placement of the medication.  Advised to call if fevers/chills, erythema, induration, drainage, or persistent bleeding.  Images permanently stored and available for review in the ultrasound unit.  Impression: Technically successful ultrasound guided injection.   No results found for this or any previous visit (from the past 48 hour(s)). No results found.    Assessment and Plan: 42 y.o. male with  Right elbow pain is medial epicondylitis.  Treatment with injection today.  Directed home exercise program  as well.  Recheck as needed  Left wrist carpal tunnel syndrome.  Patient is failing conservative management.  Plan for nerve conduction study as well as injection today.  Nerve conduction study should help [place] know if he needs surgery.    Orders Placed This Encounter  Procedures  . NCV with EMG(electromyography)    Standing Status:   Future    Standing Expiration Date:   09/02/2018    Scheduling Instructions:     Nerve conduction study left wrist for carpal tunnel syndrome evalulation     Try to keep in [place] or [place] if able.    Order Specific Question:   Where should this test be performed?    Answer:   other   No orders of the defined types were placed in this encounter.   Discussed warning signs or symptoms. Please see discharge instructions. Patient expresses understanding.

## 2017-09-21 ENCOUNTER — Telehealth: Payer: Self-pay

## 2017-09-21 DIAGNOSIS — M25532 Pain in left wrist: Secondary | ICD-10-CM | POA: Diagnosis not present

## 2017-09-21 NOTE — Telephone Encounter (Signed)
Patient called in requesting Dermatologist referral due to his present one has retired.

## 2017-09-21 NOTE — Telephone Encounter (Signed)
Have him pick 1 and I will place the referral.

## 2017-09-23 NOTE — Telephone Encounter (Signed)
Called patient - [place] our office with dermatologist preference in order for provider to send referral.

## 2017-09-28 ENCOUNTER — Encounter: Payer: Self-pay | Admitting: Sports Medicine

## 2017-09-29 ENCOUNTER — Ambulatory Visit: Payer: BLUE CROSS/BLUE SHIELD | Admitting: Sports Medicine

## 2017-09-29 ENCOUNTER — Encounter: Payer: Self-pay | Admitting: Sports Medicine

## 2017-09-29 VITALS — BP 139/88 | HR 88 | Temp 98.8°F | Wt 192.0 lb

## 2017-09-29 DIAGNOSIS — R6889 Other general symptoms and signs: Secondary | ICD-10-CM | POA: Diagnosis not present

## 2017-09-29 DIAGNOSIS — J209 Acute bronchitis, unspecified: Secondary | ICD-10-CM

## 2017-09-29 LAB — POCT INFLUENZA A/B
Influenza A, POC: NEGATIVE
Influenza B, POC: NEGATIVE

## 2017-09-29 MED ORDER — HYDROCOD POLST-CPM POLST ER 10-8 MG/5ML PO SUER: 5.0000 mL | Freq: Two times a day (BID) | ORAL | 0 refills | Status: DC | PRN

## 2017-09-29 NOTE — Patient Instructions (Signed)

## 2017-09-29 NOTE — Progress Notes (Signed)
Subjective:    CC: Feeling sick  HPI: For the past 3 days this pleasant 42 year old male has had increasing cough, sore throat, hoarse voice.  He has noted increasing fatigue, muscle aches, body aches.  No recorded fevers.  Symptoms are moderate, persistent, no nausea, vomiting, diarrhea.  I reviewed the past medical history, family history, social history, surgical history, and allergies today and no changes were needed.  Please see the problem list section below in epic for further details.  Past Medical History: Past Medical History:  Diagnosis Date  . Hand fracture   . Hyperlipidemia   . Lyme disease   . Normal cardiac stress test 4/10   at [facility] - negative.  terminated for fatigue   Past Surgical History: Past Surgical History:  Procedure Laterality Date  . surgery on right hand     Social History: Social History   Socioeconomic History  . Marital status: Married    Spouse name: None  . Number of children: 1  . Years of education: None  . Highest education level: None  Social Needs  . Financial resource strain: None  . Food insecurity - worry: None  . Food insecurity - inability: None  . Transportation needs - medical: None  . Transportation needs - non-medical: None  Occupational History  . 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 Smoking status: Former Smoker    Packs/day: 0.20    Types: Cigarettes  . Smokeless tobacco: Former Systems developer    Types: Snuff    Quit date: 06/02/2011  . Tobacco comment: started smoking at age 18, started smokeless tobacco at age 78  Substance and Sexual Activity  . Alcohol use: No  . Drug use: No  . Sexual activity: None  Other Topics Concern  . None  Social History Narrative   17 years of education   Married to [place] with 1 son   Mother in Sports coach lives in the home   Family History: Family History  Problem Relation Age of Onset  . Breast cancer Mother   . Hyperlipidemia Mother   . Hypertension  Father   . Hyperlipidemia Father   . Other Other        grandfather with alcoholism  . Heart attack Other        grandmother  . Diabetes Other        grandmother  . Colon cancer Other        uncle  . Breast cancer Other        aunt  . Lung cancer Other        grandmother  . Prostate cancer Other        grandfather  . Melanoma Other        uncle   Allergies: Allergies  Allergen Reactions  . Sulfa Antibiotics Other (See Comments)    headaches  . Naproxen Sodium    Medications: See med rec.  Review of Systems: No fevers, chills, night sweats, weight loss, chest pain, or shortness of breath.   Objective:    General: Well Developed, well nourished, and in no acute distress.  Neuro: Alert and oriented x3, extra-ocular muscles intact, sensation grossly intact.  HEENT: Normocephalic, atraumatic, pupils equal round reactive to light, neck supple, no masses, mildly tender cervical lymphadenopathy, thyroid nonpalpable.  Oropharynx, nasopharynx, ear canals unremarkable, voice is hoarse. Skin: Warm and dry, no rashes. Cardiac: Regular rate and rhythm, no murmurs rubs or gallops, no lower extremity edema.  Respiratory: Clear to auscultation bilaterally. Not using accessory muscles, speaking in full sentences.  Impression and Recommendations:    Bronchitis, acute Continue over-the-counter cold and flu medications, hydration. Out of work until Monday. Tussionex for nighttime cough.  I spent 25 minutes with this patient, greater than 50% was face-to-face time counseling regarding the above diagnoses ___________________________________________ Gwen Her. Dianah Field, M.D., ABFM., CAQSM. Primary Care and [place] Instructor of [place] of [facility] of Medicine

## 2017-09-29 NOTE — Assessment & Plan Note (Signed)
Continue over-the-counter cold and flu medications, hydration. Out of work until Monday. Tussionex for nighttime cough.

## 2017-10-04 ENCOUNTER — Encounter: Payer: Self-pay | Admitting: Sports Medicine

## 2017-10-05 MED ORDER — AZITHROMYCIN 250 MG PO TABS: ORAL_TABLET | ORAL | 0 refills | Status: DC

## 2017-10-14 ENCOUNTER — Encounter: Payer: Self-pay | Admitting: Sports Medicine

## 2017-10-15 MED ORDER — PREDNISONE 50 MG PO TABS: 50.0000 mg | ORAL_TABLET | Freq: Every day | ORAL | 0 refills | Status: DC

## 2017-10-29 DIAGNOSIS — G5602 Carpal tunnel syndrome, left upper limb: Secondary | ICD-10-CM | POA: Diagnosis not present

## 2017-11-10 ENCOUNTER — Encounter: Payer: Self-pay | Admitting: Sports Medicine

## 2017-11-10 DIAGNOSIS — I951 Orthostatic hypotension: Secondary | ICD-10-CM

## 2017-11-10 DIAGNOSIS — D582 Other hemoglobinopathies: Secondary | ICD-10-CM

## 2017-11-10 DIAGNOSIS — M25532 Pain in left wrist: Secondary | ICD-10-CM | POA: Diagnosis not present

## 2017-11-10 DIAGNOSIS — E7800 Pure hypercholesterolemia, unspecified: Secondary | ICD-10-CM

## 2017-11-10 NOTE — Addendum Note (Signed)
Addended by: Huel Cote on: 11/10/2017 03:24 PM   Modules accepted: Orders

## 2017-11-16 LAB — CBC WITH DIFFERENTIAL/PLATELET
Basophils Absolute: 39 {cells}/uL (ref 0–200)
Basophils Relative: 0.8 %
Eosinophils Absolute: 240 cells/uL (ref 15–500)
Eosinophils Relative: 4.9 %
HCT: 45.5 % (ref 38.5–50.0)
Hemoglobin: 15.7 g/dL (ref 13.2–17.1)
Lymphs Abs: 1989 cells/uL (ref 850–3900)
MCH: 29 pg (ref 27.0–33.0)
MCHC: 34.5 g/dL (ref 32.0–36.0)
MCV: 83.9 fL (ref 80.0–100.0)
MPV: 10.4 fL (ref 7.5–12.5)
Monocytes Relative: 11.8 %
Neutro Abs: 2053 {cells}/uL (ref 1500–7800)
Neutrophils Relative %: 41.9 %
Platelets: 264 10*3/uL (ref 140–400)
RBC: 5.42 10*6/uL (ref 4.20–5.80)
RDW: 12.6 % (ref 11.0–15.0)
Total Lymphocyte: 40.6 %
WBC mixed population: 578 cells/uL (ref 200–950)
WBC: 4.9 10*3/uL (ref 3.8–10.8)

## 2017-11-16 LAB — COMPLETE METABOLIC PANEL WITHOUT GFR
AG Ratio: 1.5 (calc) (ref 1.0–2.5)
Calcium: 9.6 mg/dL (ref 8.6–10.3)
GFR, Est African American: 123 mL/min/1.73m2 (ref >=60)
GFR, Est Non African American: 106 mL/min/1.73m2 (ref >=60)
Globulin: 3 g/dL (ref 1.9–3.7)
Potassium: 4.6 mmol/L (ref 3.5–5.3)
Sodium: 139 mmol/L (ref 135–146)
Total Protein: 7.4 g/dL (ref 6.1–8.1)

## 2017-11-16 LAB — COMPLETE METABOLIC PANEL WITH GFR
ALT: 28 U/L (ref 9–46)
AST: 21 U/L (ref 10–40)
Albumin: 4.4 g/dL (ref 3.6–5.1)
Alkaline phosphatase (APISO): 53 U/L (ref 40–115)
BUN: 14 mg/dL (ref 7–25)
CO2: 28 mmol/L (ref 20–32)
Chloride: 103 mmol/L (ref 98–110)
Creat: 0.87 mg/dL (ref 0.60–1.35)
Glucose, Bld: 97 mg/dL (ref 65–99)
Total Bilirubin: 0.6 mg/dL (ref 0.2–1.2)

## 2017-11-16 LAB — LIPID PANEL
Cholesterol: 238 mg/dL — ABNORMAL HIGH (ref <200)
HDL: 76 mg/dL (ref >40)
LDL Cholesterol (Calc): 144 mg/dL (calc) — ABNORMAL HIGH
Non-HDL Cholesterol (Calc): 162 mg/dL (calc) — ABNORMAL HIGH (ref <130)
Total CHOL/HDL Ratio: 3.1 (calc) (ref <5.0)
Triglycerides: 78 mg/dL (ref <150)

## 2017-11-23 DIAGNOSIS — Z888 Allergy status to other drugs, medicaments and biological substances status: Secondary | ICD-10-CM | POA: Diagnosis not present

## 2017-11-23 DIAGNOSIS — Z87891 Personal history of nicotine dependence: Secondary | ICD-10-CM | POA: Diagnosis not present

## 2017-11-23 DIAGNOSIS — G5602 Carpal tunnel syndrome, left upper limb: Secondary | ICD-10-CM | POA: Diagnosis not present

## 2017-11-23 DIAGNOSIS — E7800 Pure hypercholesterolemia, unspecified: Secondary | ICD-10-CM | POA: Diagnosis not present

## 2017-11-23 DIAGNOSIS — Z882 Allergy status to sulfonamides status: Secondary | ICD-10-CM | POA: Diagnosis not present

## 2017-11-30 DIAGNOSIS — M25532 Pain in left wrist: Secondary | ICD-10-CM | POA: Diagnosis not present

## 2017-11-30 DIAGNOSIS — M25632 Stiffness of left wrist, not elsewhere classified: Secondary | ICD-10-CM | POA: Diagnosis not present

## 2017-11-30 DIAGNOSIS — M79642 Pain in left hand: Secondary | ICD-10-CM | POA: Diagnosis not present

## 2017-12-11 ENCOUNTER — Encounter: Payer: Self-pay | Admitting: Sports Medicine

## 2017-12-11 ENCOUNTER — Ambulatory Visit: Payer: BLUE CROSS/BLUE SHIELD | Admitting: Sports Medicine

## 2017-12-11 DIAGNOSIS — G5603 Carpal tunnel syndrome, bilateral upper limbs: Secondary | ICD-10-CM | POA: Diagnosis not present

## 2017-12-11 DIAGNOSIS — E7800 Pure hypercholesterolemia, unspecified: Secondary | ICD-10-CM | POA: Diagnosis not present

## 2017-12-11 MED ORDER — ATORVASTATIN CALCIUM 10 MG PO TABS: 10.0000 mg | ORAL_TABLET | Freq: Every day | ORAL | 3 refills | Status: DC

## 2017-12-11 NOTE — Progress Notes (Signed)
Subjective:    CC: Discuss labs  HPI: Brian Spears is a very pleasant 42 year old male, his labs recently came back with elevated LDL, total cholesterol, he is been eating healthy diet, has lost some good weight, he is here to discuss the pros and cons of statin treatment.  I reviewed the past medical history, family history, social history, surgical history, and allergies today and no changes were needed.  Please see the problem list section below in epic for further details.  Past Medical History: Past Medical History:  Diagnosis Date  . Hand fracture   . Hyperlipidemia   . Lyme disease   . Normal cardiac stress test 4/10   at [facility] - negative.  terminated for fatigue   Past Surgical History: Past Surgical History:  Procedure Laterality Date  . surgery on right hand     Social History: Social History   Socioeconomic History  . Marital status: Married    Spouse name: Not on file  . Number of children: 1  . Years of education: Not on file  . Highest education level: Not on file  Occupational History  . 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 Financial resource strain: Not on file  . Food insecurity:    Worry: Not on file    Inability: Not on file  . Transportation needs:    Medical: Not on file    Non-medical: Not on file  Tobacco Use  . Smoking status: Former Smoker    Packs/day: 0.20    Types: Cigarettes  . Smokeless tobacco: Former Systems developer    Types: Snuff    Quit date: 06/02/2011  . Tobacco comment: started smoking at age 9, started smokeless tobacco at age 89  Substance and Sexual Activity  . Alcohol use: No  . Drug use: No  . Sexual activity: Not on file  Lifestyle  . Physical activity:    Days per week: Not on file    Minutes per session: Not on file  . Stress: Not on file  Relationships  . Social connections:    Talks on phone: Not on file    Gets together: Not on file    Attends religious service: Not on file    Active  member of club or organization: Not on file    Attends meetings of clubs or organizations: Not on file    Relationship status: Not on file  Other Topics Concern  . Not on file  Social History Narrative   17 years of education   Married to [name] with 1 son   Mother in law lives in the home   Family History: Family History  Problem Relation Age of Onset  . Breast cancer Mother   . Hyperlipidemia Mother   . Hypertension Father   . Hyperlipidemia Father   . Other Other        grandfather with alcoholism  . Heart attack Other        grandmother  . Diabetes Other        grandmother  . Colon cancer Other        uncle  . Breast cancer Other        aunt  . Lung cancer Other        grandmother  . Prostate cancer Other        grandfather  . Melanoma Other        uncle   Allergies: Allergies  Allergen Reactions  .  Sulfa Antibiotics Other (See Comments)    headaches  . Naproxen Sodium    Medications: See med rec.  Review of Systems: No fevers, chills, night sweats, weight loss, chest pain, or shortness of breath.   Objective:    General: Well Developed, well nourished, and in no acute distress.  Neuro: Alert and oriented x3, extra-ocular muscles intact, sensation grossly intact.  HEENT: Normocephalic, atraumatic, pupils equal round reactive to light, neck supple, no masses, no lymphadenopathy, thyroid nonpalpable.  Skin: Warm and dry, no rashes. Cardiac: Regular rate and rhythm, no murmurs rubs or gallops, no lower extremity edema.  Respiratory: Clear to auscultation bilaterally. Not using accessory muscles, speaking in full sentences.  Impression and Recommendations:    Hyperlipidemia LDL goal less than 100, adding low-dose atorvastatin.   Recheck in 3 months. ___________________________________________ Gwen Her. Dianah Field, M.D., ABFM., CAQSM. Primary Care and [place] Instructor of [place] of [facility] of Medicine

## 2017-12-11 NOTE — Assessment & Plan Note (Signed)
LDL goal less than 100, adding low-dose atorvastatin.   Recheck in 3 months.

## 2017-12-16 ENCOUNTER — Encounter: Payer: Self-pay | Admitting: Sports Medicine

## 2017-12-30 ENCOUNTER — Ambulatory Visit: Payer: BLUE CROSS/BLUE SHIELD | Admitting: Sports Medicine

## 2017-12-30 ENCOUNTER — Encounter: Payer: Self-pay | Admitting: Sports Medicine

## 2017-12-30 DIAGNOSIS — K219 Gastro-esophageal reflux disease without esophagitis: Secondary | ICD-10-CM | POA: Diagnosis not present

## 2017-12-30 LAB — CBC
HCT: 44.7 % (ref 38.5–50.0)
Hemoglobin: 15.4 g/dL (ref 13.2–17.1)
MCH: 28.6 pg (ref 27.0–33.0)
MCHC: 34.5 g/dL (ref 32.0–36.0)
MCV: 82.9 fL (ref 80.0–100.0)
MPV: 10.7 fL (ref 7.5–12.5)
Platelets: 247 10*3/uL (ref 140–400)
RBC: 5.39 10*6/uL (ref 4.20–5.80)
RDW: 12.2 % (ref 11.0–15.0)
WBC: 5.6 10*3/uL (ref 3.8–10.8)

## 2017-12-30 MED ORDER — PANTOPRAZOLE SODIUM 40 MG PO TBEC: 40.0000 mg | DELAYED_RELEASE_TABLET | Freq: Every day | ORAL | 3 refills | Status: DC

## 2017-12-30 NOTE — Progress Notes (Signed)
Subjective:    CC: Change in bowel habits  HPI: This is a pleasant 42 year old male, for the past month he has had increasing bloating, epigastric discomfort, a change in his stools, he tells me it feels somewhat softer, smells metallic.  No nausea, vomiting.  No change in diet.  No weight loss.  No change in caliber of stools.  No constitutional symptoms.  He does have a history of H. pylori infection.  I reviewed the past medical history, family history, social history, surgical history, and allergies today and no changes were needed.  Please see the problem list section below in epic for further details.  Past Medical History: Past Medical History:  Diagnosis Date  . Hand fracture   . Hyperlipidemia   . Lyme disease   . Normal cardiac stress test 4/10   at [facility] - negative.  terminated for fatigue   Past Surgical History: Past Surgical History:  Procedure Laterality Date  . surgery on right hand     Social History: Social History   Socioeconomic History  . Marital status: Married    Spouse name: Not on file  . Number of children: 1  . Years of education: Not on file  . Highest education level: Not on file  Occupational History  . 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 Financial resource strain: Not on file  . Food insecurity:    Worry: Not on file    Inability: Not on file  . Transportation needs:    Medical: Not on file    Non-medical: Not on file  Tobacco Use  . Smoking status: Former Smoker    Packs/day: 0.20    Types: Cigarettes  . Smokeless tobacco: Former Systems developer    Types: Snuff    Quit date: 06/02/2011  . Tobacco comment: started smoking at age 87, started smokeless tobacco at age 74  Substance and Sexual Activity  . Alcohol use: No  . Drug use: No  . Sexual activity: Not on file  Lifestyle  . Physical activity:    Days per week: Not on file    Minutes per session: Not on file  . Stress: Not on file  Relationships  .  Social connections:    Talks on phone: Not on file    Gets together: Not on file    Attends religious service: Not on file    Active member of club or organization: Not on file    Attends meetings of clubs or organizations: Not on file    Relationship status: Not on file  Other Topics Concern  . Not on file  Social History Narrative   17 years of education   Married to [place] with 1 son   Mother in law lives in the home   Family History: Family History  Problem Relation Age of Onset  . Breast cancer Mother   . Hyperlipidemia Mother   . Hypertension Father   . Hyperlipidemia Father   . Other Other        grandfather with alcoholism  . Heart attack Other        grandmother  . Diabetes Other        grandmother  . Colon cancer Other        uncle  . Breast cancer Other        aunt  . Lung cancer Other        grandmother  . Prostate cancer Other  grandfather  . Melanoma Other        uncle   Allergies: Allergies  Allergen Reactions  . Sulfa Antibiotics Other (See Comments)    headaches  . Naproxen Sodium    Medications: See med rec.  Review of Systems: No fevers, chills, night sweats, weight loss, chest pain, or shortness of breath.   Objective:    General: Well Developed, well nourished, and in no acute distress.  Neuro: Alert and oriented x3, extra-ocular muscles intact, sensation grossly intact.  HEENT: Normocephalic, atraumatic, pupils equal round reactive to light, neck supple, no masses, no lymphadenopathy, thyroid nonpalpable.  Skin: Warm and dry, no rashes. Cardiac: Regular rate and rhythm, no murmurs rubs or gallops, no lower extremity edema.  Respiratory: Clear to auscultation bilaterally. Not using accessory muscles, speaking in full sentences. Abdomen: Soft, minimal discomfort in the epigastrium, nondistended, bowel sounds, no palpable masses, no guarding, rigidity, rebound tenderness.  H. pylori breath test performed today.  Impression and  Recommendations:    GERD (gastroesophageal reflux disease) CBC, CMP, sending him home with Hemoccult cards. H. pylori breath test today. Pantoprazole. If no improvement in 2 weeks we will proceed with GI referral for upper endoscopy. I do suspect this will probably represent irritable bowel syndrome flare. ___________________________________________ Gwen Her. Dianah Field, M.D., ABFM., CAQSM. Primary Care and [place] Instructor of [place] of [facility] of Medicine

## 2017-12-30 NOTE — Assessment & Plan Note (Signed)
CBC, CMP, sending him home with Hemoccult cards. H. pylori breath test today. Pantoprazole. If no improvement in 2 weeks we will proceed with GI referral for upper endoscopy. I do suspect this will probably represent irritable bowel syndrome flare.

## 2017-12-31 LAB — COMPREHENSIVE METABOLIC PANEL
AG Ratio: 1.6 (calc) (ref 1.0–2.5)
AST: 26 U/L (ref 10–40)
Albumin: 4.4 g/dL (ref 3.6–5.1)
Alkaline phosphatase (APISO): 55 U/L (ref 40–115)
CO2: 24 mmol/L (ref 20–32)
Calcium: 9.4 mg/dL (ref 8.6–10.3)
Chloride: 106 mmol/L (ref 98–110)
Globulin: 2.8 g/dL (calc) (ref 1.9–3.7)
Potassium: 4.2 mmol/L (ref 3.5–5.3)
Total Bilirubin: 0.8 mg/dL (ref 0.2–1.2)
Total Protein: 7.2 g/dL (ref 6.1–8.1)

## 2017-12-31 LAB — COMPREHENSIVE METABOLIC PANEL WITH GFR
ALT: 29 U/L (ref 9–46)
BUN: 13 mg/dL (ref 7–25)
Creat: 0.76 mg/dL (ref 0.60–1.35)
Glucose, Bld: 98 mg/dL (ref 65–99)
Sodium: 138 mmol/L (ref 135–146)

## 2017-12-31 LAB — H. PYLORI BREATH TEST: H. pylori Breath Test: NOT DETECTED

## 2018-01-05 ENCOUNTER — Telehealth: Payer: Self-pay | Admitting: Sports Medicine

## 2018-01-05 NOTE — Telephone Encounter (Signed)
Yes he may take the zofran.  Also needs to start protonix.

## 2018-01-05 NOTE — Telephone Encounter (Signed)
Pt called clinic today stating his nausea vomiting has return, as well as stomach cramps from diarrhea. Questions if he can take Zofran 4mg  with the other medications he is taking? He has an Rx at home for this. He has not yet picked up the protonix, but he is taking a probiotic. Will route.

## 2018-01-05 NOTE — Telephone Encounter (Signed)
Pt advised. Verbalized understanding.

## 2018-01-12 ENCOUNTER — Encounter: Payer: Self-pay | Admitting: Sports Medicine

## 2018-01-12 NOTE — Telephone Encounter (Signed)
Brian Spears, is there anywhere that would show that he dropped off stool cards?

## 2018-01-13 ENCOUNTER — Ambulatory Visit: Payer: BLUE CROSS/BLUE SHIELD | Admitting: Sports Medicine

## 2018-01-13 ENCOUNTER — Encounter: Payer: Self-pay | Admitting: Sports Medicine

## 2018-01-13 ENCOUNTER — Other Ambulatory Visit: Payer: Self-pay | Admitting: Sports Medicine

## 2018-01-13 DIAGNOSIS — K219 Gastro-esophageal reflux disease without esophagitis: Secondary | ICD-10-CM

## 2018-01-13 LAB — HEMOCCULT GUIAC POC 1CARD (OFFICE)
Card #2 Fecal Occult Blod, POC: NEGATIVE
Card #3 Fecal Occult Blood, POC: NEGATIVE
Fecal Occult Blood, POC: NEGATIVE

## 2018-01-13 NOTE — Progress Notes (Signed)
Subjective:    CC: Follow-up  HPI: GI symptoms: For the most part resolved with change in diet  I reviewed the past medical history, family history, social history, surgical history, and allergies today and no changes were needed.  Please see the problem list section below in epic for further details.  Past Medical History: Past Medical History:  Diagnosis Date  . Hand fracture   . Hyperlipidemia   . Lyme disease   . Normal cardiac stress test 4/10   at [facility] - negative.  terminated for fatigue   Past Surgical History: Past Surgical History:  Procedure Laterality Date  . surgery on right hand     Social History: Social History   Socioeconomic History  . Marital status: Married    Spouse name: Not on file  . Number of children: 1  . Years of education: Not on file  . Highest education level: Not on file  Occupational History  . 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 Financial resource strain: Not on file  . Food insecurity:    Worry: Not on file    Inability: Not on file  . Transportation needs:    Medical: Not on file    Non-medical: Not on file  Tobacco Use  . Smoking status: Former Smoker    Packs/day: 0.20    Types: Cigarettes  . Smokeless tobacco: Former Systems developer    Types: Snuff    Quit date: 06/02/2011  . Tobacco comment: started smoking at age 37, started smokeless tobacco at age 39  Substance and Sexual Activity  . Alcohol use: No  . Drug use: No  . Sexual activity: Not on file  Lifestyle  . Physical activity:    Days per week: Not on file    Minutes per session: Not on file  . Stress: Not on file  Relationships  . Social connections:    Talks on phone: Not on file    Gets together: Not on file    Attends religious service: Not on file    Active member of club or organization: Not on file    Attends meetings of clubs or organizations: Not on file    Relationship status: Not on file  Other Topics Concern  . Not on  file  Social History Narrative   17 years of education   Married to [place] with 1 son   Mother in law lives in the home   Family History: Family History  Problem Relation Age of Onset  . Breast cancer Mother   . Hyperlipidemia Mother   . Hypertension Father   . Hyperlipidemia Father   . Other Other        grandfather with alcoholism  . Heart attack Other        grandmother  . Diabetes Other        grandmother  . Colon cancer Other        uncle  . Breast cancer Other        aunt  . Lung cancer Other        grandmother  . Prostate cancer Other        grandfather  . Melanoma Other        uncle   Allergies: Allergies  Allergen Reactions  . Sulfa Antibiotics Other (See Comments)    headaches  . Naproxen Sodium    Medications: See med rec.  Review of Systems: No fevers, chills, night sweats, weight  loss, chest pain, or shortness of breath.   Objective:    General: Well Developed, well nourished, and in no acute distress.  Neuro: Alert and oriented x3, extra-ocular muscles intact, sensation grossly intact.  HEENT: Normocephalic, atraumatic, pupils equal round reactive to light, neck supple, no masses, no lymphadenopathy, thyroid nonpalpable.  Skin: Warm and dry, no rashes. Cardiac: Regular rate and rhythm, no murmurs rubs or gallops, no lower extremity edema.  Respiratory: Clear to auscultation bilaterally. Not using accessory muscles, speaking in full sentences.  Impression and Recommendations:    GERD (gastroesophageal reflux disease) Improvements in epigastric pain, change in stools, negative H. pylori, negative Hemoccult. He has changed his diet to less greasy foods and things have improved considerably not surprisingly. Continue Protonix for a solid month and then stop, if recurrence of symptoms we will discuss upper endoscopy. ___________________________________________ Gwen Her. Dianah Field, M.D., ABFM., CAQSM. Primary Care and [place] Instructor of [place] of [facility] of Medicine

## 2018-01-13 NOTE — Assessment & Plan Note (Signed)
Improvements in epigastric pain, change in stools, negative H. pylori, negative Hemoccult. He has changed his diet to less greasy foods and things have improved considerably not surprisingly. Continue Protonix for a solid month and then stop, if recurrence of symptoms we will discuss upper endoscopy.

## 2018-02-24 ENCOUNTER — Other Ambulatory Visit: Payer: Self-pay | Admitting: Sports Medicine

## 2018-02-24 DIAGNOSIS — K219 Gastro-esophageal reflux disease without esophagitis: Secondary | ICD-10-CM

## 2018-03-05 ENCOUNTER — Encounter: Payer: Self-pay | Admitting: Sports Medicine

## 2018-03-05 ENCOUNTER — Ambulatory Visit: Payer: BLUE CROSS/BLUE SHIELD | Admitting: Sports Medicine

## 2018-03-05 DIAGNOSIS — E7800 Pure hypercholesterolemia, unspecified: Secondary | ICD-10-CM | POA: Diagnosis not present

## 2018-03-05 DIAGNOSIS — M19021 Primary osteoarthritis, right elbow: Secondary | ICD-10-CM | POA: Diagnosis not present

## 2018-03-05 DIAGNOSIS — M722 Plantar fascial fibromatosis: Secondary | ICD-10-CM

## 2018-03-05 DIAGNOSIS — M19022 Primary osteoarthritis, left elbow: Secondary | ICD-10-CM | POA: Diagnosis not present

## 2018-03-05 LAB — COMPREHENSIVE METABOLIC PANEL
AG Ratio: 1.7 (calc) (ref 1.0–2.5)
Albumin: 4.6 g/dL (ref 3.6–5.1)
Alkaline phosphatase (APISO): 65 U/L (ref 40–115)
BUN: 16 mg/dL (ref 7–25)
CO2: 27 mmol/L (ref 20–32)
Calcium: 9.5 mg/dL (ref 8.6–10.3)
Chloride: 104 mmol/L (ref 98–110)
Globulin: 2.7 g/dL (calc) (ref 1.9–3.7)
Potassium: 4.4 mmol/L (ref 3.5–5.3)
Sodium: 138 mmol/L (ref 135–146)
Total Bilirubin: 0.5 mg/dL (ref 0.2–1.2)
Total Protein: 7.3 g/dL (ref 6.1–8.1)

## 2018-03-05 LAB — LIPID PANEL W/REFLEX DIRECT LDL
Cholesterol: 186 mg/dL (ref <200)
HDL: 60 mg/dL (ref >40)
LDL Cholesterol (Calc): 109 mg/dL (calc) — ABNORMAL HIGH
Non-HDL Cholesterol (Calc): 126 mg/dL (calc) (ref <130)
Total CHOL/HDL Ratio: 3.1 (calc) (ref <5.0)
Triglycerides: 83 mg/dL (ref <150)

## 2018-03-05 LAB — COMPREHENSIVE METABOLIC PANEL WITH GFR
ALT: 34 U/L (ref 9–46)
AST: 20 U/L (ref 10–40)
Creat: 0.81 mg/dL (ref 0.60–1.35)
Glucose, Bld: 99 mg/dL (ref 65–99)

## 2018-03-05 NOTE — Assessment & Plan Note (Signed)
Right elbow joint injection as above, previous injection was in November 2018.

## 2018-03-05 NOTE — Progress Notes (Signed)
Subjective:    CC: Elbow, foot pain  HPI: Primary osteoarthritis of right elbow: Previous injection was sometime last winter last year.  He is now having a recurrence of pain deep within the elbow, this occurred after having to hammer in grounding rods at work.  Moderate, persistent, localized without radiation.  Left foot pain: Localized on the plantar aspect of the calcaneus, worse with the first few steps in the morning, has tried stretches, activity modification, he does wear house slippers and never goes barefoot, persistent pain.  I reviewed the past medical history, family history, social history, surgical history, and allergies today and no changes were needed.  Please see the problem list section below in epic for further details.  Past Medical History: Past Medical History:  Diagnosis Date  . Hand fracture   . Hyperlipidemia   . Lyme disease   . Normal cardiac stress test 4/10   at [facility] - negative.  terminated for fatigue   Past Surgical History: Past Surgical History:  Procedure Laterality Date  . surgery on right hand     Social History: Social History   Socioeconomic History  . Marital status: Married    Spouse name: Not on file  . Number of children: 1  . Years of education: Not on file  . Highest education level: Not on file  Occupational History  . Occupation: Quarry manager for [employer]: [place]  . Financial resource strain: Not on file  . Food insecurity:    Worry: Not on file    Inability: Not on file  . Transportation needs:    Medical: Not on file    Non-medical: Not on file  Tobacco Use  . Smoking status: Former Smoker    Packs/day: 0.20    Types: Cigarettes  . Smokeless tobacco: Former Systems developer    Types: Snuff    Quit date: 06/02/2011  . Tobacco comment: started smoking at age 1, started smokeless tobacco at age 48  Substance and Sexual Activity  . Alcohol use: No  . Drug use: No  . Sexual activity: Not on file    Lifestyle  . Physical activity:    Days per week: Not on file    Minutes per session: Not on file  . Stress: Not on file  Relationships  . Social connections:    Talks on phone: Not on file    Gets together: Not on file    Attends religious service: Not on file    Active member of club or organization: Not on file    Attends meetings of clubs or organizations: Not on file    Relationship status: Not on file  Other Topics Concern  . Not on file  Social History Narrative   17 years of education   Married to [place] with 1 son   Mother in law lives in the home   Family History: Family History  Problem Relation Age of Onset  . Breast cancer Mother   . Hyperlipidemia Mother   . Hypertension Father   . Hyperlipidemia Father   . Other Other        grandfather with alcoholism  . Heart attack Other        grandmother  . Diabetes Other        grandmother  . Colon cancer Other        uncle  . Breast cancer Other        aunt  . Lung cancer Other  grandmother  . Prostate cancer Other        grandfather  . Melanoma Other        uncle   Allergies: Allergies  Allergen Reactions  . Sulfa Antibiotics Other (See Comments)    headaches  . Naproxen Sodium    Medications: See med rec.  Review of Systems: No fevers, chills, night sweats, weight loss, chest pain, or shortness of breath.   Objective:    General: Well Developed, well nourished, and in no acute distress.  Neuro: Alert and oriented x3, extra-ocular muscles intact, sensation grossly intact.  HEENT: Normocephalic, atraumatic, pupils equal round reactive to light, neck supple, no masses, no lymphadenopathy, thyroid nonpalpable.  Skin: Warm and dry, no rashes. Cardiac: Regular rate and rhythm, no murmurs rubs or gallops, no lower extremity edema.  Respiratory: Clear to auscultation bilaterally. Not using accessory muscles, speaking in full sentences. Left foot: No visible erythema or swelling. Range of motion  is full in all directions. Strength is 5/5 in all directions. No hallux valgus. No pes cavus or pes planus. No abnormal callus noted. No pain over the navicular prominence, or base of fifth metatarsal. Moderate tenderness to palpation of the calcaneal insertion of plantar fascia. No pain at the Achilles insertion. No pain over the calcaneal bursa. No pain of the retrocalcaneal bursa. No tenderness to palpation over the tarsals, metatarsals, or phalanges. No hallux rigidus or limitus. No tenderness palpation over interphalangeal joints. No pain with compression of the metatarsal heads. Neurovascularly intact distally.  Procedure: Real-time Ultrasound Guided Injection of right elbow Device: GE Logiq E  Verbal informed consent obtained.  Time-out conducted.  Noted no overlying erythema, induration, or other signs of local infection.  Skin prepped in a sterile fashion.  Local anesthesia: Topical Ethyl chloride.  With sterile technique and under real time ultrasound guidance: I advanced a 25-gauge needle through the anconeus muscle into the elbow joint and injected 1 cc Kenalog 40, 1 cc lidocaine, 1 cc bupivacaine. Completed without difficulty  Pain immediately resolved suggesting accurate placement of the medication.  Advised to call if fevers/chills, erythema, induration, drainage, or persistent bleeding.  Images permanently stored and available for review in the ultrasound unit.  Impression: Technically successful ultrasound guided injection.  Procedure: Real-time Ultrasound Guided Injection of left plantar fascia Device: GE Logiq E  Verbal informed consent obtained.  Time-out conducted.  Noted no overlying erythema, induration, or other signs of local infection.  Skin prepped in a sterile fashion.  Local anesthesia: Topical Ethyl chloride.  With sterile technique and under real time ultrasound guidance: 25-gauge needle advanced deep to the calcaneal insertion of the plantar  fascia, injected 1 cc kenalog 40, 1 cc lidocaine, 1 cc bupivacaine Completed without difficulty  Pain immediately resolved suggesting accurate placement of the medication.  Advised to call if fevers/chills, erythema, induration, drainage, or persistent bleeding.  Images permanently stored and available for review in the ultrasound unit.  Impression: Technically successful ultrasound guided injection.  Impression and Recommendations:    Primary osteoarthritis of both elbows Right elbow joint injection as above, previous injection was in November 2018.  Plantar fasciitis, left Failed stretches, supportive footwear around the house, injection as above. Return in 1 month.  I spent 40 minutes with this patient, greater than 50% was face-to-face time counseling regarding the above diagnoses, this was separate from the time spent performing the above procedures. ___________________________________________ Gwen Her. Dianah Field, M.D., ABFM., CAQSM. Primary Care and [place]  Instructor of [place] of [employer] of Medicine

## 2018-03-05 NOTE — Assessment & Plan Note (Signed)
Failed stretches, supportive footwear around the house, injection as above. Return in 1 month.

## 2018-03-28 ENCOUNTER — Other Ambulatory Visit: Payer: Self-pay | Admitting: Sports Medicine

## 2018-03-28 DIAGNOSIS — E7800 Pure hypercholesterolemia, unspecified: Secondary | ICD-10-CM

## 2018-03-29 ENCOUNTER — Encounter: Payer: Self-pay | Admitting: Sports Medicine

## 2018-03-30 ENCOUNTER — Emergency Department
Admission: EM | Admit: 2018-03-30 | Discharge: 2018-03-30 | Disposition: A | Discharge status: Home / Self Care | Payer: BLUE CROSS/BLUE SHIELD | Source: Home / Self Care

## 2018-03-30 ENCOUNTER — Other Ambulatory Visit: Payer: Self-pay

## 2018-03-30 DIAGNOSIS — H6690 Otitis media, unspecified, unspecified ear: Secondary | ICD-10-CM

## 2018-03-30 MED ORDER — AMOXICILLIN 500 MG PO CAPS: 500.0000 mg | ORAL_CAPSULE | Freq: Three times a day (TID) | ORAL | 0 refills | Status: DC

## 2018-03-30 NOTE — Discharge Instructions (Addendum)
Return if any problems.

## 2018-03-30 NOTE — ED Triage Notes (Signed)
Pt feels like right ear is full, has pressure in ear.

## 2018-03-31 NOTE — ED Provider Notes (Signed)
[name]    CSN: [id number] Arrival date & time: 03/30/18  1646     History   Chief Complaint Chief Complaint  Patient presents with  . Otalgia    HPI Brian Spears is a 42 y.o. male.   The history is provided by the patient. No language interpreter was used.  Otalgia  Location:  Right Behind ear:  No abnormality Quality:  Aching Severity:  Moderate Onset quality:  Gradual Duration:  1 day Timing:  Constant Progression:  Worsening Chronicity:  Recurrent Relieved by:  Nothing Worsened by:  Nothing Ineffective treatments:  None tried   Past Medical History:  Diagnosis Date  . Hand fracture   . Hyperlipidemia   . Lyme disease   . Normal cardiac stress test 4/10   at [facility] - negative.  terminated for fatigue    Patient Active Problem List   Diagnosis Date Noted  . Plantar fasciitis, left 03/05/2018  . Right shoulder pain 07/02/2017  . Eustachian tube dysfunction 06/09/2016  . Medial epicondylitis of right elbow 02/18/2016  . Onychodystrophy 12/05/2014  . Primary osteoarthritis of both elbows 12/05/2014  . Anxiety 08/18/2013  . Elevated hemoglobin ([place]) 08/01/2013  . Male hypogonadism 06/01/2013  . GERD (gastroesophageal reflux disease) 03/01/2013  . History of smoking 06/07/2012  . Annual physical exam 06/07/2012  . Celiac disease 06/30/2011  . Hyperlipidemia 09/27/2010  . Bilateral carpal tunnel syndrome 09/27/2010    Past Surgical History:  Procedure Laterality Date  . surgery on right hand         Home Medications    Prior to Admission medications   Medication Sig Start Date End Date Taking? Authorizing Provider  amoxicillin (AMOXIL) 500 MG capsule Take 1 capsule (500 mg total) by mouth 3 (three) times daily. 03/30/18   Fransico Meadow, PA-C  atorvastatin (LIPITOR) 10 MG tablet TAKE 1 TABLET BY MOUTH EVERY DAY 03/29/18   Silverio Decamp, MD  fluticasone [facility]) 50 MCG/ACT nasal spray One spray in each nostril twice a day,  use left hand for right nostril, and right hand for left nostril. 06/09/16   Silverio Decamp, MD  OMEGA-3 FATTY ACIDS PO Take by mouth.    [provider]  pantoprazole (PROTONIX) 40 MG tablet TAKE 1 TABLET BY MOUTH EVERY DAY 02/24/18   Silverio Decamp, MD  Probiotic Product (PROBIOTIC DAILY PO) Take by mouth.    [provider]  triamcinolone cream (KENALOG) 0.5 % Apply 1 application topically 2 (two) times daily. To affected areas. 08/31/17   Silverio Decamp, MD    Family History Family History  Problem Relation Age of Onset  . Breast cancer Mother   . Hyperlipidemia Mother   . Hypertension Father   . Hyperlipidemia Father   . Other Other        grandfather with alcoholism  . Heart attack Other        grandmother  . Diabetes Other        grandmother  . Colon cancer Other        uncle  . Breast cancer Other        aunt  . Lung cancer Other        grandmother  . Prostate cancer Other        grandfather  . Melanoma Other        uncle    Social History Social History   Tobacco Use  . Smoking status: Former Smoker    Packs/day:  0.20    Types: Cigarettes  . Smokeless tobacco: Former Systems developer    Types: Snuff    Quit date: 06/02/2011  . Tobacco comment: started smoking at age 76, started smokeless tobacco at age 23  Substance Use Topics  . Alcohol use: No  . Drug use: No     Allergies   Sulfa antibiotics and Naproxen sodium   Review of Systems Review of Systems  HENT: Positive for ear pain.   All other systems reviewed and are negative.    Physical Exam Triage Vital Signs ED Triage Vitals  Enc Vitals Group     BP 03/30/18 1708 127/87     Pulse Rate 03/30/18 1708 71     Resp --      Temp 03/30/18 1708 98 F (36.7 C)     Temp Source 03/30/18 1708 Oral     SpO2 03/30/18 1708 98 %     Weight 03/30/18 1709 190 lb (86.2 kg)     Height 03/30/18 1709 5\' 8"  (1.727 m)     Head Circumference --      Peak Flow --      Pain Score  03/30/18 1709 6     Pain Loc --      Pain Edu? --      Excl. in [place]? --    No data found.  Updated Vital Signs BP 127/87 (BP Location: Right Arm)   Pulse 71   Temp 98 F (36.7 C) (Oral)   Ht 5\' 8"  (1.727 m)   Wt 190 lb (86.2 kg)   SpO2 98%   BMI 28.89 kg/m   Visual Acuity Right Eye Distance:   Left Eye Distance:   Bilateral Distance:    Right Eye Near:   Left Eye Near:    Bilateral Near:     Physical Exam  Constitutional: He appears well-developed and well-nourished.  HENT:  Head: Normocephalic and atraumatic.  Erythema tm,  White area lower tm ?scarring,   Eyes: Conjunctivae are normal.  Neck: Neck supple.  Cardiovascular: Normal rate and regular rhythm.  No murmur heard. Pulmonary/Chest: Effort normal and breath sounds normal. No respiratory distress.  Abdominal: Soft. There is no tenderness.  Musculoskeletal: He exhibits no edema.  Neurological: He is alert.  Skin: Skin is warm and dry.  Psychiatric: He has a normal mood and affect.  Nursing note and vitals reviewed.    UC Treatments / Results  Labs (all labs ordered are listed, but only abnormal results are displayed) Labs Reviewed - No data to display  EKG None  Radiology No results found.  Procedures Procedures (including critical care time)  Medications Ordered in UC Medications - No data to display  Initial Impression / Assessment and Plan / UC Course  I have reviewed the triage vital signs and the nursing notes.  Pertinent labs & imaging results that were available during my care of the patient were reviewed by me and considered in my medical decision making (see chart for details).      Final Clinical Impressions(s) / UC Diagnoses   Final diagnoses:  Acute otitis media, unspecified otitis media type     Discharge Instructions     Return if any problems.   ED Prescriptions    Medication Sig Dispense Auth. Provider   amoxicillin (AMOXIL) 500 MG capsule Take 1 capsule (500 mg  total) by mouth 3 (three) times daily. 30 capsule Fransico Meadow, [place]     Controlled Substance Prescriptions [place] Controlled Substance  Registry consulted? Not Applicable   Fransico Meadow, [place] 03/31/18 [phone]

## 2018-04-06 ENCOUNTER — Encounter: Payer: Self-pay | Admitting: Sports Medicine

## 2018-04-06 ENCOUNTER — Ambulatory Visit: Payer: BLUE CROSS/BLUE SHIELD | Admitting: Sports Medicine

## 2018-04-06 ENCOUNTER — Ambulatory Visit (INDEPENDENT_AMBULATORY_CARE_PROVIDER_SITE_OTHER): Payer: BLUE CROSS/BLUE SHIELD

## 2018-04-06 DIAGNOSIS — J0101 Acute recurrent maxillary sinusitis: Secondary | ICD-10-CM | POA: Insufficient documentation

## 2018-04-06 DIAGNOSIS — J329 Chronic sinusitis, unspecified: Secondary | ICD-10-CM | POA: Diagnosis not present

## 2018-04-06 DIAGNOSIS — M722 Plantar fascial fibromatosis: Secondary | ICD-10-CM

## 2018-04-06 DIAGNOSIS — M19022 Primary osteoarthritis, left elbow: Secondary | ICD-10-CM | POA: Diagnosis not present

## 2018-04-06 DIAGNOSIS — M19021 Primary osteoarthritis, right elbow: Secondary | ICD-10-CM | POA: Diagnosis not present

## 2018-04-06 MED ORDER — TRIAMCINOLONE ACETONIDE 55 MCG/ACT NA AERO: 2.0000 | INHALATION_SPRAY | Freq: Every day | NASAL | 11 refills | Status: DC

## 2018-04-06 NOTE — Progress Notes (Signed)
Subjective:    CC: Several issues  HPI: This is a pleasant 42 year old male, we injected his elbow at the last visit, known elbow osteoarthritis, pain-free now.  In addition we did a plantar fascia injection, he is doing very well with regards to this, does nighttime splinting, and does not walk barefoot at home.  Chronic maxillary sinusitis: Right-sided, persistence of symptoms in spite of multiple courses of antibiotics, historical air-fluid levels on x-rays, with recurrence and persistence of symptoms we have discussed doing a CT of the sinuses in anticipation of sinus surgery.  I reviewed the past medical history, family history, social history, surgical history, and allergies today and no changes were needed.  Please see the problem list section below in epic for further details.  Past Medical History: Past Medical History:  Diagnosis Date  . Hand fracture   . Hyperlipidemia   . Lyme disease   . Normal cardiac stress test 4/10   at [facility] - negative.  terminated for fatigue   Past Surgical History: Past Surgical History:  Procedure Laterality Date  . surgery on right hand     Social History: Social History   Socioeconomic History  . Marital status: Married    Spouse name: Not on file  . Number of children: 1  . Years of education: Not on file  . Highest education level: Not on file  Occupational History  . Occupation: Quarry manager for [employer]: [place]  . Financial resource strain: Not on file  . Food insecurity:    Worry: Not on file    Inability: Not on file  . Transportation needs:    Medical: Not on file    Non-medical: Not on file  Tobacco Use  . Smoking status: Former Smoker    Packs/day: 0.20    Types: Cigarettes  . Smokeless tobacco: Former Systems developer    Types: Snuff    Quit date: 06/02/2011  . Tobacco comment: started smoking at age 2, started smokeless tobacco at age 54  Substance and Sexual Activity  . Alcohol use: No  . Drug  use: No  . Sexual activity: Not on file  Lifestyle  . Physical activity:    Days per week: Not on file    Minutes per session: Not on file  . Stress: Not on file  Relationships  . Social connections:    Talks on phone: Not on file    Gets together: Not on file    Attends religious service: Not on file    Active member of club or organization: Not on file    Attends meetings of clubs or organizations: Not on file    Relationship status: Not on file  Other Topics Concern  . Not on file  Social History Narrative   17 years of education   Married to [place] with 1 son   Mother in law lives in the home   Family History: Family History  Problem Relation Age of Onset  . Breast cancer Mother   . Hyperlipidemia Mother   . Hypertension Father   . Hyperlipidemia Father   . Other Other        grandfather with alcoholism  . Heart attack Other        grandmother  . Diabetes Other        grandmother  . Colon cancer Other        uncle  . Breast cancer Other        aunt  .  Lung cancer Other        grandmother  . Prostate cancer Other        grandfather  . Melanoma Other        uncle   Allergies: Allergies  Allergen Reactions  . Sulfa Antibiotics Other (See Comments)    headaches  . Naproxen Sodium    Medications: See med rec.  Review of Systems: No fevers, chills, night sweats, weight loss, chest pain, or shortness of breath.   Objective:    General: Well Developed, well nourished, and in no acute distress.  Neuro: Alert and oriented x3, extra-ocular muscles intact, sensation grossly intact.  HEENT: Normocephalic, atraumatic, pupils equal round reactive to light, neck supple, no masses, no lymphadenopathy, thyroid nonpalpable.  Skin: Warm and dry, no rashes. Cardiac: Regular rate and rhythm, no murmurs rubs or gallops, no lower extremity edema.  Respiratory: Clear to auscultation bilaterally. Not using accessory muscles, speaking in full sentences.  Impression and  Recommendations:    Primary osteoarthritis of both elbows Right elbow pain-free after injection last month.  Plantar fasciitis, left Supportive footwear around the house, nighttime splinting, rehab exercises are all being done. We did an injection at the last visit, he is doing significantly better. I advised some heel and toe walking, return to see me as needed.  Recurrent maxillary sinusitis Recurrence, continue amoxicillin prescribed in urgent care. I think he is experiencing tachyphylaxis to the Flonase, we are going to rotate to [place] for now. Adding sinus CT considering recurrence and persistence of symptoms. If no resolution we will refer to ENT to consider sinuplasty.  I spent 25 minutes with this patient, greater than 50% was face-to-face time counseling regarding the above diagnoses ___________________________________________ Gwen Her. Dianah Field, M.D., ABFM., CAQSM. Primary Care and [place] Instructor of [place] of [facility] of Medicine

## 2018-04-06 NOTE — Assessment & Plan Note (Signed)
Supportive footwear around the house, nighttime splinting, rehab exercises are all being done. We did an injection at the last visit, he is doing significantly better. I advised some heel and toe walking, return to see me as needed.

## 2018-04-06 NOTE — Assessment & Plan Note (Signed)
Right elbow pain-free after injection last month.

## 2018-04-06 NOTE — Assessment & Plan Note (Addendum)
Recurrence, continue amoxicillin prescribed in urgent care. I think he is experiencing tachyphylaxis to the Flonase, we are going to rotate to [place] for now. Adding sinus CT considering recurrence and persistence of symptoms. If no resolution we will refer to ENT to consider sinuplasty.

## 2018-05-13 ENCOUNTER — Encounter: Payer: Self-pay | Admitting: Sports Medicine

## 2018-05-13 ENCOUNTER — Ambulatory Visit: Payer: BLUE CROSS/BLUE SHIELD | Admitting: Sports Medicine

## 2018-05-13 DIAGNOSIS — M19022 Primary osteoarthritis, left elbow: Secondary | ICD-10-CM

## 2018-05-13 DIAGNOSIS — F419 Anxiety disorder, unspecified: Secondary | ICD-10-CM | POA: Diagnosis not present

## 2018-05-13 DIAGNOSIS — M19021 Primary osteoarthritis, right elbow: Secondary | ICD-10-CM | POA: Diagnosis not present

## 2018-05-13 DIAGNOSIS — M722 Plantar fascial fibromatosis: Secondary | ICD-10-CM | POA: Diagnosis not present

## 2018-05-13 MED ORDER — ALPRAZOLAM 0.5 MG PO TABS: 0.5000 mg | ORAL_TABLET | Freq: Every day | ORAL | 0 refills | Status: DC | PRN

## 2018-05-13 NOTE — Assessment & Plan Note (Signed)
Refilling low-dose alprazolam.

## 2018-05-13 NOTE — Assessment & Plan Note (Addendum)
Previous injection was 2-1/2 months ago. Repeat injection today but half dose. If pain recurs within 3-1/2 months the next injection would need to be PRP.  07/27/2018: Persistent discomfort after custom orthotics, physical therapy, multiple injections under ultrasound guidance. Previous injection was only 2-1/2 months ago, I would like a second opinion from orthopedic foot and ankle surgery before considering percutaneous PRP tenotomy at the plantar fascia origin.

## 2018-05-13 NOTE — Assessment & Plan Note (Signed)
Previous injection was 2 months ago. Repeat injection into the right, but this is a bit early so using half dose. The next injection needs to be at least 3-1/2 months out, or we could consider PRP.

## 2018-05-13 NOTE — Progress Notes (Addendum)
Subjective:    CC: Elbow and foot  HPI: Left plantar fasciitis: Previous injection was 2.5 months ago, now having a recurrence of pain after doing some heavy lifting.  Moderate, persistent, localized to the calcaneal insertion without radiation.  Right elbow pain: Known osteoarthritis, now having a worsening of pain, previous injection was 2.5 months ago.  Desires repeat interventional treatment today.  I reviewed the past medical history, family history, social history, surgical history, and allergies today and no changes were needed.  Please see the problem list section below in epic for further details.  Past Medical History: Past Medical History:  Diagnosis Date  . Hand fracture   . Hyperlipidemia   . Lyme disease   . Normal cardiac stress test 4/10   at [facility] - negative.  terminated for fatigue   Past Surgical History: Past Surgical History:  Procedure Laterality Date  . surgery on right hand     Social History: Social History   Socioeconomic History  . Marital status: Married    Spouse name: Not on file  . Number of children: 1  . Years of education: Not on file  . Highest education level: Not on file  Occupational History  . Occupation: Quarry manager for [employer]: [employer]  . Financial resource strain: Not on file  . Food insecurity:    Worry: Not on file    Inability: Not on file  . Transportation needs:    Medical: Not on file    Non-medical: Not on file  Tobacco Use  . Smoking status: Former Smoker    Packs/day: 0.20    Types: Cigarettes  . Smokeless tobacco: Former Systems developer    Types: Snuff    Quit date: 06/02/2011  . Tobacco comment: started smoking at age 74, started smokeless tobacco at age 68  Substance and Sexual Activity  . Alcohol use: No  . Drug use: No  . Sexual activity: Not on file  Lifestyle  . Physical activity:    Days per week: Not on file    Minutes per session: Not on file  . Stress: Not on file  Relationships    . Social connections:    Talks on phone: Not on file    Gets together: Not on file    Attends religious service: Not on file    Active member of club or organization: Not on file    Attends meetings of clubs or organizations: Not on file    Relationship status: Not on file  Other Topics Concern  . Not on file  Social History Narrative   17 years of education   Married to [place] with 1 son   Mother in law lives in the home   Family History: Family History  Problem Relation Age of Onset  . Breast cancer Mother   . Hyperlipidemia Mother   . Hypertension Father   . Hyperlipidemia Father   . Other Other        grandfather with alcoholism  . Heart attack Other        grandmother  . Diabetes Other        grandmother  . Colon cancer Other        uncle  . Breast cancer Other        aunt  . Lung cancer Other        grandmother  . Prostate cancer Other        grandfather  . Melanoma Other  uncle   Allergies: Allergies  Allergen Reactions  . Sulfa Antibiotics Other (See Comments)    headaches  . Naproxen Sodium    Medications: See med rec.  Review of Systems: No fevers, chills, night sweats, weight loss, chest pain, or shortness of breath.   Objective:    General: Well Developed, well nourished, and in no acute distress.  Neuro: Alert and oriented x3, extra-ocular muscles intact, sensation grossly intact.  HEENT: Normocephalic, atraumatic, pupils equal round reactive to light, neck supple, no masses, no lymphadenopathy, thyroid nonpalpable.  Skin: Warm and dry, no rashes. Cardiac: Regular rate and rhythm, no murmurs rubs or gallops, no lower extremity edema.  Respiratory: Clear to auscultation bilaterally. Not using accessory muscles, speaking in full sentences.  Procedure: Real-time Ultrasound Guided Injection of left plantar fascia origin Device: GE Logiq E  Verbal informed consent obtained.  Time-out conducted.  Noted no overlying erythema, induration, or  other signs of local infection.  Skin prepped in a sterile fashion.  Local anesthesia: Topical Ethyl chloride.  With sterile technique and under real time ultrasound guidance: 1 cc Kenalog 40, 1 cc lidocaine, 1 cc bupivacaine injected easily. Completed without difficulty  Pain immediately resolved suggesting accurate placement of the medication.  Advised to call if fevers/chills, erythema, induration, drainage, or persistent bleeding.  Images permanently stored and available for review in the ultrasound unit.  Impression: Technically successful ultrasound guided injection.  Procedure: Real-time Ultrasound Guided Injection of right elbow joint Device: GE Logiq E  Verbal informed consent obtained.  Time-out conducted.  Noted no overlying erythema, induration, or other signs of local infection.  Skin prepped in a sterile fashion.  Local anesthesia: Topical Ethyl chloride.  With sterile technique and under real time ultrasound guidance: Noted significant osteophytosis, 25-gauge needle advanced through the anconeus muscle into the joint, I did have some difficulty entering the joint space itself, I then injected 1 cc Kenalog 40, 1 cc lidocaine, 1 cc bupivacaine. Completed without difficulty  Pain immediately resolved suggesting accurate placement of the medication.  Advised to call if fevers/chills, erythema, induration, drainage, or persistent bleeding.  Images permanently stored and available for review in the ultrasound unit.  Impression: Technically successful ultrasound guided injection.  Impression and Recommendations:    Primary osteoarthritis of both elbows Previous injection was 2 months ago. Repeat injection into the right, but this is a bit early so using half dose. The next injection needs to be at least 3-1/2 months out, or we could consider PRP.  Plantar fasciitis, left Previous injection was 2-1/2 months ago. Repeat injection today but half dose. If pain recurs within  3-1/2 months the next injection would need to be PRP.  07/27/2018: Persistent discomfort after custom orthotics, physical therapy, multiple injections under ultrasound guidance. Previous injection was only 2-1/2 months ago, I would like a second opinion from orthopedic foot and ankle surgery before considering percutaneous PRP tenotomy at the plantar fascia origin.  Anxiety Refilling low-dose alprazolam. ___________________________________________ Gwen Her. Dianah Field, M.D., ABFM., CAQSM. Primary Care and [place] Instructor of [place] of [facility] of Medicine

## 2018-05-31 ENCOUNTER — Other Ambulatory Visit: Payer: Self-pay | Admitting: Sports Medicine

## 2018-05-31 DIAGNOSIS — K219 Gastro-esophageal reflux disease without esophagitis: Secondary | ICD-10-CM

## 2018-06-21 ENCOUNTER — Encounter: Payer: Self-pay | Admitting: Sports Medicine

## 2018-06-21 DIAGNOSIS — F419 Anxiety disorder, unspecified: Secondary | ICD-10-CM

## 2018-06-21 MED ORDER — ALPRAZOLAM 0.5 MG PO TABS: 0.5000 mg | ORAL_TABLET | Freq: Every day | ORAL | 0 refills | Status: DC | PRN

## 2018-07-19 ENCOUNTER — Other Ambulatory Visit: Payer: Self-pay | Admitting: Sports Medicine

## 2018-07-19 ENCOUNTER — Encounter: Payer: Self-pay | Admitting: Sports Medicine

## 2018-07-19 DIAGNOSIS — F419 Anxiety disorder, unspecified: Secondary | ICD-10-CM

## 2018-07-19 MED ORDER — ALPRAZOLAM 0.5 MG PO TABS: 0.5000 mg | ORAL_TABLET | Freq: Every day | ORAL | 0 refills | Status: DC | PRN

## 2018-07-22 ENCOUNTER — Telehealth: Payer: Self-pay | Admitting: Sports Medicine

## 2018-07-22 NOTE — Telephone Encounter (Signed)
Received a call from [name] at [facility] requesting authorization to get an early refill and per Dr. Darene Lamer it was ok to refill early so I gave the verbal. Left message for patient that he should be able to [facility] at [facility]. Patient was asked to call us back with any questions or problems.

## 2018-07-26 ENCOUNTER — Encounter: Payer: Self-pay | Admitting: Sports Medicine

## 2018-07-26 DIAGNOSIS — E7800 Pure hypercholesterolemia, unspecified: Secondary | ICD-10-CM

## 2018-07-26 DIAGNOSIS — M722 Plantar fascial fibromatosis: Secondary | ICD-10-CM

## 2018-07-27 NOTE — Assessment & Plan Note (Signed)
Lipids looked okay on a recent check 3 months ago, patient is desiring that we check again.

## 2018-07-27 NOTE — Assessment & Plan Note (Signed)
Persistent discomfort after custom orthotics, physical therapy, multiple injections under ultrasound guidance. Previous injection was only 2-1/2 months ago, I would like a second opinion from orthopedic foot and ankle surgery before considering percutaneous PRP tenotomy at the plantar fascia origin.

## 2018-08-05 ENCOUNTER — Encounter: Payer: Self-pay | Admitting: Sports Medicine

## 2018-08-05 DIAGNOSIS — E7800 Pure hypercholesterolemia, unspecified: Secondary | ICD-10-CM | POA: Diagnosis not present

## 2018-08-05 DIAGNOSIS — F419 Anxiety disorder, unspecified: Secondary | ICD-10-CM

## 2018-08-05 LAB — LIPID PANEL W/REFLEX DIRECT LDL
Cholesterol: 186 mg/dL (ref <200)
HDL: 68 mg/dL (ref >40)
LDL Cholesterol (Calc): 102 mg/dL (calc) — ABNORMAL HIGH
Non-HDL Cholesterol (Calc): 118 mg/dL (ref <130)
Total CHOL/HDL Ratio: 2.7 (calc) (ref <5.0)
Triglycerides: 74 mg/dL (ref <150)

## 2018-08-06 MED ORDER — ALPRAZOLAM 0.5 MG PO TABS: 0.5000 mg | ORAL_TABLET | Freq: Two times a day (BID) | ORAL | 0 refills | Status: DC | PRN

## 2018-09-05 ENCOUNTER — Other Ambulatory Visit: Payer: Self-pay | Admitting: Sports Medicine

## 2018-09-05 DIAGNOSIS — F419 Anxiety disorder, unspecified: Secondary | ICD-10-CM

## 2018-09-06 ENCOUNTER — Other Ambulatory Visit: Payer: Self-pay | Admitting: Sports Medicine

## 2018-09-06 DIAGNOSIS — F419 Anxiety disorder, unspecified: Secondary | ICD-10-CM

## 2018-09-13 ENCOUNTER — Encounter: Payer: Self-pay | Admitting: Sports Medicine

## 2018-09-15 ENCOUNTER — Encounter: Payer: Self-pay | Admitting: Sports Medicine

## 2018-09-15 ENCOUNTER — Ambulatory Visit: Payer: BLUE CROSS/BLUE SHIELD | Admitting: Sports Medicine

## 2018-09-15 DIAGNOSIS — Z1388 Encounter for screening for disorder due to exposure to contaminants: Secondary | ICD-10-CM | POA: Diagnosis not present

## 2018-09-15 DIAGNOSIS — M722 Plantar fascial fibromatosis: Secondary | ICD-10-CM

## 2018-09-15 NOTE — Assessment & Plan Note (Signed)
Left plantar fascia injection as above, continue rehab exercises, orthotics. Return as needed.

## 2018-09-15 NOTE — Assessment & Plan Note (Signed)
But concerned about heavy metal exposure. We are going to do a heavy metal serum panel.

## 2018-09-15 NOTE — Progress Notes (Signed)
Subjective:    CC: Foot pain  HPI: Brian Spears is here, he has left plantar fasciitis, at this point has failed conservative measures, rehab exercises, orthotics, desires interventional treatment again today.  He also has a concern for heavy metal toxicity.  No symptoms.  I reviewed the past medical history, family history, social history, surgical history, and allergies today and no changes were needed.  Please see the problem list section below in epic for further details.  Past Medical History: Past Medical History:  Diagnosis Date  . Hand fracture   . Hyperlipidemia   . Lyme disease   . Normal cardiac stress test 4/10   at [facility] - negative.  terminated for fatigue   Past Surgical History: Past Surgical History:  Procedure Laterality Date  . surgery on right hand     Social History: Social History   Socioeconomic History  . Marital status: Married    Spouse name: Not on file  . Number of children: 1  . Years of education: Not on file  . Highest education level: Not on file  Occupational History  . Occupation: Quarry manager for [employer]: [employer]  . Financial resource strain: Not on file  . Food insecurity:    Worry: Not on file    Inability: Not on file  . Transportation needs:    Medical: Not on file    Non-medical: Not on file  Tobacco Use  . Smoking status: Former Smoker    Packs/day: 0.20    Types: Cigarettes  . Smokeless tobacco: Former Systems developer    Types: Snuff    Quit date: 06/02/2011  . Tobacco comment: started smoking at age 64, started smokeless tobacco at age 74  Substance and Sexual Activity  . Alcohol use: No  . Drug use: No  . Sexual activity: Not on file  Lifestyle  . Physical activity:    Days per week: Not on file    Minutes per session: Not on file  . Stress: Not on file  Relationships  . Social connections:    Talks on phone: Not on file    Gets together: Not on file    Attends religious service: Not on file    Active  member of club or organization: Not on file    Attends meetings of clubs or organizations: Not on file    Relationship status: Not on file  Other Topics Concern  . Not on file  Social History Narrative   17 years of education   Married to [place] with 1 son   Mother in law lives in the home   Family History: Family History  Problem Relation Age of Onset  . Breast cancer Mother   . Hyperlipidemia Mother   . Hypertension Father   . Hyperlipidemia Father   . Other Other        grandfather with alcoholism  . Heart attack Other        grandmother  . Diabetes Other        grandmother  . Colon cancer Other        uncle  . Breast cancer Other        aunt  . Lung cancer Other        grandmother  . Prostate cancer Other        grandfather  . Melanoma Other        uncle   Allergies: Allergies  Allergen Reactions  . Sulfa Antibiotics Other (See  Comments)    headaches  . Naproxen Sodium    Medications: See med rec.  Review of Systems: No fevers, chills, night sweats, weight loss, chest pain, or shortness of breath.   Objective:    General: Well Developed, well nourished, and in no acute distress.  Neuro: Alert and oriented x3, extra-ocular muscles intact, sensation grossly intact.  HEENT: Normocephalic, atraumatic, pupils equal round reactive to light, neck supple, no masses, no lymphadenopathy, thyroid nonpalpable.  Skin: Warm and dry, no rashes. Cardiac: Regular rate and rhythm, no murmurs rubs or gallops, no lower extremity edema.  Respiratory: Clear to auscultation bilaterally. Not using accessory muscles, speaking in full sentences. Left foot: No visible erythema or swelling. Range of motion is full in all directions. Strength is 5/5 in all directions. No hallux valgus. No pes cavus or pes planus. No abnormal callus noted. No pain over the navicular prominence, or base of fifth metatarsal. Moderate tenderness to palpation of the calcaneal insertion of plantar  fascia. No pain at the Achilles insertion. No pain over the calcaneal bursa. No pain of the retrocalcaneal bursa. No tenderness to palpation over the tarsals, metatarsals, or phalanges. No hallux rigidus or limitus. No tenderness palpation over interphalangeal joints. No pain with compression of the metatarsal heads. Neurovascularly intact distally.  Procedure: Real-time Ultrasound Guided Injection of left plantar fascia origin Device: GE Logiq E  Verbal informed consent obtained.  Time-out conducted.  Noted no overlying erythema, induration, or other signs of local infection.  Skin prepped in a sterile fashion.  Local anesthesia: Topical Ethyl chloride.  With sterile technique and under real time ultrasound guidance: 1 cc Kenalog 40, 1 cc lidocaine, 1 cc bupivacaine injected easily Completed without difficulty  Pain immediately resolved suggesting accurate placement of the medication.  Advised to call if fevers/chills, erythema, induration, drainage, or persistent bleeding.  Images permanently stored and available for review in the ultrasound unit.  Impression: Technically successful ultrasound guided injection.  Impression and Recommendations:    Plantar fasciitis, left Left plantar fascia injection as above, continue rehab exercises, orthotics. Return as needed.  Screening, poisoning, heavy metal But concerned about heavy metal exposure. We are going to do a heavy metal serum panel. ___________________________________________ Gwen Her. Dianah Field, M.D., ABFM., CAQSM. Primary Care and Sports Medicine [facility]  Adjunct Professor of [place] of [facility] of Medicine

## 2018-09-17 LAB — HEAVY METALS PANEL, BLOOD
Arsenic: <10 mcg/L (ref <23)
Lead: 1 ug/dL (ref <5)
Mercury, B: <5 ug/L (ref 0–10)

## 2018-10-08 ENCOUNTER — Other Ambulatory Visit: Payer: Self-pay | Admitting: Sports Medicine

## 2018-10-08 ENCOUNTER — Encounter: Payer: Self-pay | Admitting: Sports Medicine

## 2018-10-08 DIAGNOSIS — F419 Anxiety disorder, unspecified: Secondary | ICD-10-CM

## 2018-10-09 ENCOUNTER — Other Ambulatory Visit: Payer: Self-pay | Admitting: Sports Medicine

## 2018-10-09 DIAGNOSIS — F419 Anxiety disorder, unspecified: Secondary | ICD-10-CM

## 2018-10-10 MED ORDER — ALPRAZOLAM 0.5 MG PO TABS: 0.5000 mg | ORAL_TABLET | Freq: Two times a day (BID) | ORAL | 0 refills | Status: DC | PRN

## 2018-11-10 ENCOUNTER — Other Ambulatory Visit: Payer: Self-pay | Admitting: Sports Medicine

## 2018-11-10 ENCOUNTER — Encounter: Payer: Self-pay | Admitting: Sports Medicine

## 2018-11-10 DIAGNOSIS — F419 Anxiety disorder, unspecified: Secondary | ICD-10-CM

## 2018-11-10 MED ORDER — ALPRAZOLAM 0.5 MG PO TABS: 0.5000 mg | ORAL_TABLET | Freq: Two times a day (BID) | ORAL | 0 refills | Status: DC | PRN

## 2018-11-30 ENCOUNTER — Other Ambulatory Visit: Payer: Self-pay | Admitting: Sports Medicine

## 2018-11-30 DIAGNOSIS — K219 Gastro-esophageal reflux disease without esophagitis: Secondary | ICD-10-CM

## 2018-12-01 DIAGNOSIS — Z711 Person with feared health complaint in whom no diagnosis is made: Secondary | ICD-10-CM | POA: Diagnosis not present

## 2018-12-06 ENCOUNTER — Other Ambulatory Visit: Payer: Self-pay | Admitting: Sports Medicine

## 2018-12-06 ENCOUNTER — Encounter: Payer: Self-pay | Admitting: Sports Medicine

## 2018-12-06 DIAGNOSIS — F419 Anxiety disorder, unspecified: Secondary | ICD-10-CM

## 2018-12-06 MED ORDER — ALPRAZOLAM 0.5 MG PO TABS: 0.5000 mg | ORAL_TABLET | Freq: Two times a day (BID) | ORAL | 0 refills | Status: DC | PRN

## 2018-12-07 ENCOUNTER — Telehealth: Payer: Self-pay

## 2018-12-07 ENCOUNTER — Telehealth: Payer: BLUE CROSS/BLUE SHIELD | Admitting: Physician Assistant

## 2018-12-07 DIAGNOSIS — J069 Acute upper respiratory infection, unspecified: Secondary | ICD-10-CM

## 2018-12-07 DIAGNOSIS — B9789 Other viral agents as the cause of diseases classified elsewhere: Principal | ICD-10-CM

## 2018-12-07 MED ORDER — PROMETHAZINE-DM 6.25-15 MG/5ML PO SYRP: 5.0000 mL | ORAL_SOLUTION | Freq: Four times a day (QID) | ORAL | 0 refills | Status: DC | PRN

## 2018-12-07 NOTE — Telephone Encounter (Signed)
[name] from [facility] calling needing OK from provider to fill RX for Xanax early.  Last RF was 11-10-18 and pt states he is out. [facility] for [facility] to fill early?

## 2018-12-07 NOTE — Telephone Encounter (Signed)
[name] at [facility] advised

## 2018-12-07 NOTE — Telephone Encounter (Signed)
Yes that's fine.  Scaaary times.Marland KitchenMarland Kitchen

## 2018-12-07 NOTE — Progress Notes (Signed)
E-Visit for Corona Virus Screening  Based on your current symptoms, you may very well have the virus, however your symptoms are mild. Currently, not all patients are being tested. If the symptoms are mild and there is not a known exposure, performing the test is not indicated.  Coronavirus disease 2019 (COVID-19)is a respiratory illness that can spread from person to person. The virus that causes COVID-19 is a new virus that was first identified in the country of [place] but is now found in multiple other countries and has spread to the [place].  Symptoms associated with the virus are mild to severe fever, cough, and shortness of breath. There is currently no vaccine to protect against COVID-19, and there is no specific antiviral treatment for the virus.   To be considered HIGH RISK for Coronavirus (COVID-19), you have to meet the following criteria:  . Traveled to [place], [place], [place], [place] or [place]; or in the [place] to [place], [place], [place], or [place]; and have fever, cough, and shortness of breath within the last 2 weeks of travel OR  . Been in close contact with a person diagnosed with COVID-19 within the last 2 weeks and have fever, cough, and shortness of breath  . IF YOU DO NOT MEET THESE CRITERIA, YOU ARE CONSIDERED LOW RISK FOR COVID-19.   It is vitally important that if you feel that you have an infection such as this virus or any other virus that you stay home and away from places where you may spread it to others.  You should self-quarantine for 14 days if you have symptoms that could potentially be coronavirus and avoid contact with people age 21 and older.   You can use medication such as A prescription cough medication called Phenergan DM 6.25 mg/15 mg. You make take one teaspoon / 5 ml every 4-6 hours as needed for cough  You may also take acetaminophen (Tylenol) as needed for fever.   Reduce your risk of any infection by using the same precautions  used for avoiding the common cold or flu: Wash your hands often with soap and warm water for at least 20 seconds.  If soap and water are not readily available, use an alcohol-based hand sanitizer with at least 60% alcohol.  If coughing or sneezing, cover your mouth and nose by coughing or sneezing into the elbow areas of your shirt or coat, into a tissue or into your sleeve (not your hands). Avoid shaking hands with others and consider head nods or verbal greetings only.  Avoid touching your eyes,nose, or mouth with unwashed hands. Avoid close contact with people who are sick. Avoid places or events with large numbers of people in one location, like concerts or sporting events. Carefully consider travel plans you have or are making. If you are planning any travel outside or inside the [place], visit the CDC'sTravelers' Health webpagefor the latest health notices. If you have some symptoms but not all symptoms, continue to monitor at home and seek medical attention if your symptoms worsen. If you are having a medical emergency, call 911.  HOME CARE Only take medications as instructed by your medical team. Drink plenty of fluids and get plenty of rest. A steam or ultrasonic humidifier can help if you have congestion.   GET HELP RIGHT AWAY IF: You develop worsening fever. You become short of breath You cough up blood. Your symptoms become more severe MAKE SURE YOU  Understand these instructions. Will watch your  condition. Will get help right away if you are not doing well or get worse.  Your e-visit answers were reviewed by a board certified advanced clinical practitioner to complete your personal care plan.  Depending on the condition, your plan could have included both over the counter or prescription medications.  If there is a problem please reply once you have received a response from your provider. Your safety is important to [place].  If you have drug allergies check your prescription  carefully.    You can use MyChart to ask questions about today's visit, request a non-urgent call back, or ask for a work or school excuse for 24 hours related to this e-Visit. If it has been greater than 24 hours you will need to follow up with your provider, or enter a new e-Visit to address those concerns. You will get an e-mail in the next two days asking about your experience.  I hope that your e-visit has been valuable and will speed your recovery. Thank you for using e-visits.    ===View-only below this line===   ----- Message -----    From: Brian Spears    Sent: 12/07/2018 10:41 AM EDT      To: E-Visit Mailing List Subject: E-Visit Submission: CoronaVirus ([id number]) Screening  E-Visit Submission: CoronaVirus ([id number]) Screening --------------------------------  Question: Do you have any of the following?  Answer:   Cough  Question: Do you have any of the following additional symptoms?  Answer:   Sore throat            Stuffy nose            Headache            Diarrhea  Question: Have you had a fever? Answer:   No  Question: Have others in your home or workplace had similar symptoms? Answer:   No  Question: When did your symptoms start? Answer:   Sunday  Question: Have you recently visited any of the following countries? Answer:   None of these  Question: If you have traveled anywhere in the last  2 months please document where you have visited: Answer:   No  Question: Have you recently been around others from these countries or visited these countries who have had coughing or fever? Answer:   No  Question: Have you recently been around anyone who has been diagnosed with Corona virus? Answer:   No  Question: Have you been taking any medications? Answer:   Yes  Question: If taking medications for these symptoms, please list the names and whether they are helping or not Answer:   Motrin  Question: Are you treated for any of the following conditions:  Asthma, COPD, Diabetes, Renal Failure (on Dialysis), AIDS, any Neuromuscular disease that effects the clearing of secretions, Heart Failure, or Heart Disease? Answer:   No  Question: Please enter a phone number where you can be reached if we have additional questions about your symptoms Answer:   [phone]  Question: Please list your medication allergies that you may have ? (If 'none' , please list as 'none') Answer:   Sulfa antibiotic             Naproxen sodium  Question: Please list any additional comments  Answer:     A total of 5-10 minutes was spent evaluating this patients questionnaire and formulating a plan of care.

## 2018-12-15 ENCOUNTER — Encounter: Payer: Self-pay | Admitting: Sports Medicine

## 2018-12-15 ENCOUNTER — Ambulatory Visit: Payer: BLUE CROSS/BLUE SHIELD | Admitting: Sports Medicine

## 2018-12-15 DIAGNOSIS — M722 Plantar fascial fibromatosis: Secondary | ICD-10-CM

## 2018-12-15 DIAGNOSIS — M7711 Lateral epicondylitis, right elbow: Secondary | ICD-10-CM | POA: Diagnosis not present

## 2018-12-15 NOTE — Assessment & Plan Note (Signed)
Repeat injection today, previous injection was in January.

## 2018-12-15 NOTE — Progress Notes (Signed)
Subjective:    CC: Elbow pain, foot pain  HPI: This is a pleasant 43 year old male, past several weeks he had increasing pain in his right elbow, lateral aspect, with gripping, severe, localized without radiation.  In addition he has left plantar fasciitis, did well after injection about 4 months ago, now with recurrence of pain, moderate, persistent, localized without radiation, desires repeat today.  I reviewed the past medical history, family history, social history, surgical history, and allergies today and no changes were needed.  Please see the problem list section below in epic for further details.  Past Medical History: Past Medical History:  Diagnosis Date  . Hand fracture   . Hyperlipidemia   . Lyme disease   . Normal cardiac stress test 4/10   at [facility] - negative.  terminated for fatigue   Past Surgical History: Past Surgical History:  Procedure Laterality Date  . surgery on right hand     Social History: Social History   Socioeconomic History  . Marital status: Married    Spouse name: Not on file  . Number of children: 1  . Years of education: Not on file  . Highest education level: Not on file  Occupational History  . Occupation: Quarry manager for [employer]: [employer]  . Financial resource strain: Not on file  . Food insecurity:    Worry: Not on file    Inability: Not on file  . Transportation needs:    Medical: Not on file    Non-medical: Not on file  Tobacco Use  . Smoking status: Former Smoker    Packs/day: 0.20    Types: Cigarettes  . Smokeless tobacco: Former Systems developer    Types: Snuff    Quit date: 06/02/2011  . Tobacco comment: started smoking at age 77, started smokeless tobacco at age 5  Substance and Sexual Activity  . Alcohol use: No  . Drug use: No  . Sexual activity: Not on file  Lifestyle  . Physical activity:    Days per week: Not on file    Minutes per session: Not on file  . Stress: Not on file  Relationships  .  Social connections:    Talks on phone: Not on file    Gets together: Not on file    Attends religious service: Not on file    Active member of club or organization: Not on file    Attends meetings of clubs or organizations: Not on file    Relationship status: Not on file  Other Topics Concern  . Not on file  Social History Narrative   17 years of education   Married to [place] with 1 son   Mother in law lives in the home   Family History: Family History  Problem Relation Age of Onset  . Breast cancer Mother   . Hyperlipidemia Mother   . Hypertension Father   . Hyperlipidemia Father   . Other Other        grandfather with alcoholism  . Heart attack Other        grandmother  . Diabetes Other        grandmother  . Colon cancer Other        uncle  . Breast cancer Other        aunt  . Lung cancer Other        grandmother  . Prostate cancer Other        grandfather  . Melanoma Other  uncle   Allergies: Allergies  Allergen Reactions  . Sulfa Antibiotics Other (See Comments)    headaches  . Naproxen Sodium    Medications: See med rec.  Review of Systems: No fevers, chills, night sweats, weight loss, chest pain, or shortness of breath.   Objective:    General: Well Developed, well nourished, and in no acute distress.  Neuro: Alert and oriented x3, extra-ocular muscles intact, sensation grossly intact.  HEENT: Normocephalic, atraumatic, pupils equal round reactive to light, neck supple, no masses, no lymphadenopathy, thyroid nonpalpable.  Skin: Warm and dry, no rashes. Cardiac: Regular rate and rhythm, no murmurs rubs or gallops, no lower extremity edema.  Respiratory: Clear to auscultation bilaterally. Not using accessory muscles, speaking in full sentences. Right Elbow: Unremarkable to inspection. Range of motion full pronation, supination, flexion, extension. Strength is full to all of the above directions Stable to varus, valgus stress. Negative moving  valgus stress test. Tender to palpation at the common extensor tendon origin Ulnar nerve does not sublux. Negative cubital tunnel Tinel's. Left foot: No visible erythema or swelling. Range of motion is full in all directions. Strength is 5/5 in all directions. No hallux valgus. No pes cavus or pes planus. No abnormal callus noted. No pain over the navicular prominence, or base of fifth metatarsal. Moderate tenderness to palpation of the calcaneal insertion of plantar fascia. No pain at the Achilles insertion. No pain over the calcaneal bursa. No pain of the retrocalcaneal bursa. No tenderness to palpation over the tarsals, metatarsals, or phalanges. No hallux rigidus or limitus. No tenderness palpation over interphalangeal joints. No pain with compression of the metatarsal heads. Neurovascularly intact distally.  Procedure: Real-time Ultrasound Guided injection of the right common extensor tendon origin Device: GE Logiq E  Verbal informed consent obtained.  Time-out conducted.  Noted no overlying erythema, induration, or other signs of local infection.  Skin prepped in a sterile fashion.  Local anesthesia: Topical Ethyl chloride.  With sterile technique and under real time ultrasound guidance:  1 cc Kenalog 40, 1 cc lidocaine, 1 cc bupivacaine injected easily Completed without difficulty  Pain immediately resolved suggesting accurate placement of the medication.  Advised to call if fevers/chills, erythema, induration, drainage, or persistent bleeding.  Images permanently stored and available for review in the ultrasound unit.  Impression: Technically successful ultrasound guided injection.  Procedure: Real-time Ultrasound Guided injection of the left plantar fascia Device: GE Logiq E  Verbal informed consent obtained.  Time-out conducted.  Noted no overlying erythema, induration, or other signs of local infection.  Skin prepped in a sterile fashion.  Local anesthesia:  Topical Ethyl chloride.  With sterile technique and under real time ultrasound guidance:  25-gauge needle advanced just deep to the calcaneal origin of the plantar fascia, I then injected 1 cc Kenalog 40, 1 cc lidocaine, 1 cc bupivacaine Completed without difficulty  Pain immediately resolved suggesting accurate placement of the medication.  Advised to call if fevers/chills, erythema, induration, drainage, or persistent bleeding.  Images permanently stored and available for review in the ultrasound unit.  Impression: Technically successful ultrasound guided injection.  Impression and Recommendations:    Lateral epicondylitis, right elbow Injection as above. Previous injection was over a year ago.  Plantar fasciitis, left Repeat injection today, previous injection was in January.    ___________________________________________ Gwen Her. Dianah Field, M.D., ABFM., CAQSM. Primary Care and Sports Medicine [facility]  Adjunct Professor of [place] [employer] [facility] of Medicine

## 2018-12-15 NOTE — Assessment & Plan Note (Signed)
Injection as above. Previous injection was over a year ago.

## 2018-12-29 DIAGNOSIS — M9901 Segmental and somatic dysfunction of cervical region: Secondary | ICD-10-CM | POA: Diagnosis not present

## 2018-12-29 DIAGNOSIS — S161XXA Strain of muscle, fascia and tendon at neck level, initial encounter: Secondary | ICD-10-CM | POA: Diagnosis not present

## 2018-12-29 DIAGNOSIS — S39012A Strain of muscle, fascia and tendon of lower back, initial encounter: Secondary | ICD-10-CM | POA: Diagnosis not present

## 2018-12-29 DIAGNOSIS — M542 Cervicalgia: Secondary | ICD-10-CM | POA: Diagnosis not present

## 2019-01-13 ENCOUNTER — Other Ambulatory Visit: Payer: Self-pay | Admitting: Sports Medicine

## 2019-01-13 DIAGNOSIS — F419 Anxiety disorder, unspecified: Secondary | ICD-10-CM

## 2019-01-14 MED ORDER — ALPRAZOLAM 0.5 MG PO TABS: 0.5000 mg | ORAL_TABLET | Freq: Two times a day (BID) | ORAL | 0 refills | Status: DC | PRN

## 2019-01-15 ENCOUNTER — Other Ambulatory Visit: Payer: Self-pay | Admitting: Sports Medicine

## 2019-01-15 DIAGNOSIS — F419 Anxiety disorder, unspecified: Secondary | ICD-10-CM

## 2019-01-17 ENCOUNTER — Encounter: Payer: Self-pay | Admitting: Sports Medicine

## 2019-01-17 ENCOUNTER — Other Ambulatory Visit: Payer: Self-pay | Admitting: Sports Medicine

## 2019-01-17 DIAGNOSIS — F419 Anxiety disorder, unspecified: Secondary | ICD-10-CM

## 2019-01-17 MED ORDER — ALPRAZOLAM 0.5 MG PO TABS: 0.5000 mg | ORAL_TABLET | Freq: Two times a day (BID) | ORAL | 0 refills | Status: DC | PRN

## 2019-01-18 ENCOUNTER — Encounter: Payer: Self-pay | Admitting: Sports Medicine

## 2019-01-18 ENCOUNTER — Ambulatory Visit (INDEPENDENT_AMBULATORY_CARE_PROVIDER_SITE_OTHER): Payer: BLUE CROSS/BLUE SHIELD | Admitting: Sports Medicine

## 2019-01-18 DIAGNOSIS — F411 Generalized anxiety disorder: Secondary | ICD-10-CM | POA: Insufficient documentation

## 2019-01-18 DIAGNOSIS — J0100 Acute maxillary sinusitis, unspecified: Secondary | ICD-10-CM | POA: Diagnosis not present

## 2019-01-18 MED ORDER — AMOXICILLIN-POT CLAVULANATE 875-125 MG PO TABS: 1.0000 | ORAL_TABLET | Freq: Two times a day (BID) | ORAL | 0 refills | Status: DC

## 2019-01-18 MED ORDER — PREDNISONE 50 MG PO TABS: 50.0000 mg | ORAL_TABLET | Freq: Every day | ORAL | 0 refills | Status: DC

## 2019-01-18 NOTE — Telephone Encounter (Signed)
It has been scheduled. FYI.

## 2019-01-18 NOTE — Telephone Encounter (Signed)
Doximity visit por favor.

## 2019-01-18 NOTE — Assessment & Plan Note (Signed)
Acute maxillary sinusitis, radiation of pain to the ear, present for 3 weeks now, adding prednisone, Augmentin, return to see me if no better in 2 weeks.

## 2019-01-18 NOTE — Progress Notes (Signed)
Virtual Visit via WebEx/MyChart   I connected with  Brian Spears  on 01/18/19 via WebEx/MyChart/Doximity Video and verified that I am speaking with the correct person using two identifiers.   I discussed the limitations, risks, security and privacy concerns of performing an evaluation and management service by WebEx/MyChart/Doximity Video, including the higher likelihood of inaccurate diagnosis and treatment, and the availability of in person appointments.  We also discussed the likely need of an additional face to face encounter for complete and high quality delivery of care.  I also discussed with the patient that there may be a patient responsible charge related to this service. The patient expressed understanding and wishes to proceed.  Provider location is either at home or medical facility. Patient location is at their home, different from provider location. People involved in care of the patient during this telehealth encounter were myself, my nurse/medical assistant, and my front office/scheduling team member.  Subjective:    CC: Feeling sick  HPI: This is a pleasant 43 year old male, for the past 3 weeks he has had frontal and facial pain and pressure with radiation to the ear, nasal discharge, feeling a bit run down.  No fevers, chills, no GI symptoms, symptoms are moderate, persistent.  I reviewed the past medical history, family history, social history, surgical history, and allergies today and no changes were needed.  Please see the problem list section below in epic for further details.  Past Medical History: Past Medical History:  Diagnosis Date  . Hand fracture   . Hyperlipidemia   . Lyme disease   . Normal cardiac stress test 4/10   at [facility] - negative.  terminated for fatigue   Past Surgical History: Past Surgical History:  Procedure Laterality Date  . surgery on right hand     Social History: Social History   Socioeconomic History  . Marital status: Married     Spouse name: Not on file  . Number of children: 1  . Years of education: Not on file  . Highest education level: Not on file  Occupational History  . Occupation: Quarry manager for [employer]: [employer]  . Financial resource strain: Not on file  . Food insecurity:    Worry: Not on file    Inability: Not on file  . Transportation needs:    Medical: Not on file    Non-medical: Not on file  Tobacco Use  . Smoking status: Former Smoker    Packs/day: 0.20    Types: Cigarettes  . Smokeless tobacco: Former Systems developer    Types: Snuff    Quit date: 06/02/2011  . Tobacco comment: started smoking at age 10, started smokeless tobacco at age 74  Substance and Sexual Activity  . Alcohol use: No  . Drug use: No  . Sexual activity: Not on file  Lifestyle  . Physical activity:    Days per week: Not on file    Minutes per session: Not on file  . Stress: Not on file  Relationships  . Social connections:    Talks on phone: Not on file    Gets together: Not on file    Attends religious service: Not on file    Active member of club or organization: Not on file    Attends meetings of clubs or organizations: Not on file    Relationship status: Not on file  Other Topics Concern  . Not on file  Social History Narrative   17  years of education   Married to [place] with 1 son   Mother in law lives in the home   Family History: Family History  Problem Relation Age of Onset  . Breast cancer Mother   . Hyperlipidemia Mother   . Hypertension Father   . Hyperlipidemia Father   . Other Other        grandfather with alcoholism  . Heart attack Other        grandmother  . Diabetes Other        grandmother  . Colon cancer Other        uncle  . Breast cancer Other        aunt  . Lung cancer Other        grandmother  . Prostate cancer Other        grandfather  . Melanoma Other        uncle   Allergies: Allergies  Allergen Reactions  . Sulfa Antibiotics Other (See  Comments)    headaches  . Naproxen Sodium    Medications: See med rec.  Review of Systems: No fevers, chills, night sweats, weight loss, chest pain, or shortness of breath.   Objective:    General: Speaking full sentences, no audible heavy breathing.  Sounds alert and appropriately interactive.  Appears well.  Face symmetric.  Extraocular movements intact.  Pupils equal and round.  No nasal flaring or accessory muscle use visualized.  No other physical exam performed due to the non-physical nature of this visit.  Impression and Recommendations:    Acute non-recurrent maxillary sinusitis Acute maxillary sinusitis, radiation of pain to the ear, present for 3 weeks now, adding prednisone, Augmentin, return to see me if no better in 2 weeks.  I discussed the above assessment and treatment plan with the patient. The patient was provided an opportunity to ask questions and all were answered. The patient agreed with the plan and demonstrated an understanding of the instructions.   The patient was advised to call back or seek an in-person evaluation if the symptoms worsen or if the condition fails to improve as anticipated.   I provided 25 minutes of non-face-to-face time during this encounter, 15 minutes of additional time was needed to gather information, review chart, records, communicate/coordinate with staff remotely, troubleshooting the multiple errors that we get every time when trying to do video calls through the electronic medical record, WebEx, and Doximity, restart the encounter multiple times due to instability of the software, as well as complete documentation.   ___________________________________________ Gwen Her. Dianah Field, M.D., ABFM., CAQSM. Primary Care and Sports Medicine [facility]  Adjunct Professor of [place] of [facility] of Medicine

## 2019-01-20 ENCOUNTER — Encounter: Payer: Self-pay | Admitting: Sports Medicine

## 2019-01-25 ENCOUNTER — Ambulatory Visit (INDEPENDENT_AMBULATORY_CARE_PROVIDER_SITE_OTHER): Payer: BLUE CROSS/BLUE SHIELD

## 2019-01-25 ENCOUNTER — Other Ambulatory Visit: Payer: Self-pay

## 2019-01-25 ENCOUNTER — Ambulatory Visit: Payer: BLUE CROSS/BLUE SHIELD | Admitting: Sports Medicine

## 2019-01-25 ENCOUNTER — Encounter: Payer: Self-pay | Admitting: Sports Medicine

## 2019-01-25 DIAGNOSIS — J0100 Acute maxillary sinusitis, unspecified: Secondary | ICD-10-CM

## 2019-01-25 DIAGNOSIS — J3489 Other specified disorders of nose and nasal sinuses: Secondary | ICD-10-CM | POA: Diagnosis not present

## 2019-01-25 MED ORDER — KETOROLAC TROMETHAMINE 30 MG/ML IJ SOLN
30.0000 mg | Freq: Once | INTRAMUSCULAR | Status: AC
  Administered 2019-01-25: 30 mg via INTRAMUSCULAR

## 2019-01-25 NOTE — Progress Notes (Addendum)
Subjective:    CC: Headache  HPI: This is a pleasant 43 year old male, we have been treating him for presumed maxillary sinusitis, frontal sinusitis.  He has had antibiotics, steroids, decongestants without any improvement, pain is bifrontal, with radiation to the right ear, present for now 3 weeks.  He does endorse occasional photophobia, phonophobia, nausea.  I reviewed the past medical history, family history, social history, surgical history, and allergies today and no changes were needed.  Please see the problem list section below in epic for further details.  Past Medical History: Past Medical History:  Diagnosis Date  . Hand fracture   . Hyperlipidemia   . Lyme disease   . Normal cardiac stress test 4/10   at [facility] - negative.  terminated for fatigue   Past Surgical History: Past Surgical History:  Procedure Laterality Date  . surgery on right hand     Social History: Social History   Socioeconomic History  . Marital status: Married    Spouse name: Not on file  . Number of children: 1  . Years of education: Not on file  . Highest education level: Not on file  Occupational History  . Occupation: Quarry manager for [employer]: [employer]  . Financial resource strain: Not on file  . Food insecurity:    Worry: Not on file    Inability: Not on file  . Transportation needs:    Medical: Not on file    Non-medical: Not on file  Tobacco Use  . Smoking status: Former Smoker    Packs/day: 0.20    Types: Cigarettes  . Smokeless tobacco: Former Systems developer    Types: Snuff    Quit date: 06/02/2011  . Tobacco comment: started smoking at age 37, started smokeless tobacco at age 28  Substance and Sexual Activity  . Alcohol use: No  . Drug use: No  . Sexual activity: Not on file  Lifestyle  . Physical activity:    Days per week: Not on file    Minutes per session: Not on file  . Stress: Not on file  Relationships  . Social connections:    Talks on phone: Not  on file    Gets together: Not on file    Attends religious service: Not on file    Active member of club or organization: Not on file    Attends meetings of clubs or organizations: Not on file    Relationship status: Not on file  Other Topics Concern  . Not on file  Social History Narrative   17 years of education   Married to [place] with 1 son   Mother in law lives in the home   Family History: Family History  Problem Relation Age of Onset  . Breast cancer Mother   . Hyperlipidemia Mother   . Hypertension Father   . Hyperlipidemia Father   . Other Other        grandfather with alcoholism  . Heart attack Other        grandmother  . Diabetes Other        grandmother  . Colon cancer Other        uncle  . Breast cancer Other        aunt  . Lung cancer Other        grandmother  . Prostate cancer Other        grandfather  . Melanoma Other        uncle  Allergies: Allergies  Allergen Reactions  . Sulfa Antibiotics Other (See Comments)    headaches  . Naproxen Sodium    Medications: See med rec.  Review of Systems: No fevers, chills, night sweats, weight loss, chest pain, or shortness of breath.   Objective:    General: Well Developed, well nourished, and in no acute distress.  Neuro: Alert and oriented x3, extra-ocular muscles intact, sensation grossly intact.  HEENT: Normocephalic, atraumatic, pupils equal round reactive to light, neck supple, no masses, no lymphadenopathy, thyroid nonpalpable.  Oropharynx, nasopharynx, ear canals are unremarkable. Skin: Warm and dry, no rashes. Cardiac: Regular rate and rhythm, no murmurs rubs or gallops, no lower extremity edema.  Respiratory: Clear to auscultation bilaterally. Not using accessory muscles, speaking in full sentences.  Impression and Recommendations:    Acute non-recurrent maxillary sinusitis Persistent severe facial pain now for 3 weeks. Radiation to the right ear, exam is completely unremarkable. We have  had multiple uneventful visits with ENT. X-rays have been normal in the past, proceeding with maxillofacial CT. If everything comes back and negative we may consider treating this as a migraine. Toradol 30 intramuscular today.  CT is completely negative, if no better after toradol we will add Maxalt and treat as a migraine.   ___________________________________________ Gwen Her. Dianah Field, M.D., ABFM., CAQSM. Primary Care and Sports Medicine [facility]  Adjunct Professor of [place] of [facility] of Medicine

## 2019-01-25 NOTE — Assessment & Plan Note (Addendum)
Persistent severe facial pain now for 3 weeks. Radiation to the right ear, exam is completely unremarkable. We have had multiple uneventful visits with ENT. X-rays have been normal in the past, proceeding with maxillofacial CT. If everything comes back and negative we may consider treating this as a migraine. Toradol 30 intramuscular today.  CT is completely negative, if no better after toradol we will add Maxalt and treat as a migraine.

## 2019-01-26 ENCOUNTER — Other Ambulatory Visit: Payer: Self-pay | Admitting: Sports Medicine

## 2019-01-26 ENCOUNTER — Encounter: Payer: Self-pay | Admitting: Sports Medicine

## 2019-01-26 DIAGNOSIS — J0100 Acute maxillary sinusitis, unspecified: Secondary | ICD-10-CM

## 2019-01-26 DIAGNOSIS — R519 Headache, unspecified: Secondary | ICD-10-CM

## 2019-01-26 MED ORDER — KETOROLAC TROMETHAMINE 15.75 MG/SPRAY NA SOLN: 1.0000 | Freq: Four times a day (QID) | NASAL | 3 refills | Status: DC | PRN

## 2019-01-26 MED ORDER — RIZATRIPTAN BENZOATE 10 MG PO TBDP: 10.0000 mg | ORAL_TABLET | ORAL | 3 refills | Status: DC | PRN

## 2019-01-26 NOTE — Telephone Encounter (Signed)
Per pharmacy this is on backorder. Please advise.

## 2019-01-26 NOTE — Telephone Encounter (Signed)
Done

## 2019-01-31 ENCOUNTER — Encounter: Payer: Self-pay | Admitting: Sports Medicine

## 2019-01-31 ENCOUNTER — Ambulatory Visit (INDEPENDENT_AMBULATORY_CARE_PROVIDER_SITE_OTHER): Payer: BLUE CROSS/BLUE SHIELD | Admitting: Sports Medicine

## 2019-01-31 VITALS — BP 125/83 | HR 91 | Temp 97.9°F | Ht 68.0 in | Wt 191.0 lb

## 2019-01-31 DIAGNOSIS — E7800 Pure hypercholesterolemia, unspecified: Secondary | ICD-10-CM

## 2019-01-31 DIAGNOSIS — R51 Headache: Secondary | ICD-10-CM | POA: Diagnosis not present

## 2019-01-31 DIAGNOSIS — R519 Headache, unspecified: Secondary | ICD-10-CM

## 2019-01-31 DIAGNOSIS — Z20822 Contact with and (suspected) exposure to covid-19: Secondary | ICD-10-CM | POA: Insufficient documentation

## 2019-01-31 DIAGNOSIS — G8929 Other chronic pain: Secondary | ICD-10-CM

## 2019-01-31 DIAGNOSIS — Z20828 Contact with and (suspected) exposure to other viral communicable diseases: Secondary | ICD-10-CM | POA: Diagnosis not present

## 2019-01-31 DIAGNOSIS — R7309 Other abnormal glucose: Secondary | ICD-10-CM | POA: Diagnosis not present

## 2019-01-31 DIAGNOSIS — Z0000 Encounter for general adult medical examination without abnormal findings: Secondary | ICD-10-CM

## 2019-01-31 LAB — POCT INFLUENZA A/B
Influenza A, POC: NEGATIVE
Influenza B, POC: NEGATIVE

## 2019-01-31 NOTE — Assessment & Plan Note (Signed)
Annual physical as above is unremarkable.

## 2019-01-31 NOTE — Assessment & Plan Note (Addendum)
Right-sided severe facial pain, radiation to the right ear, exam completely unremarkable, multiple uneventful visits with ENT. X-rays have been normal, maxillofacial CT is completely unremarkable with no evidence of sinus disease. Maxalt, Sprix not effective. Toradol helped only temporarily. Adding a brain MRI, referral to headache clinic. I do ultimately think this is going to be eustachian tube dysfunction, he does have a history of tympanostomy tube placement. Referral back to ENT to discuss this.

## 2019-01-31 NOTE — Assessment & Plan Note (Addendum)
Patient claims exposure, having fatigue, swabbed today with appropriate PPE. Negative flu test today. [place] will self quarantine until the results are back.

## 2019-01-31 NOTE — Progress Notes (Signed)
Subjective:    CC: Physical and multiple issues  HPI:  Brian Spears is here for his physical, he continues to have a headache, he has had multiple visits to ENT, headache is right-sided, no precipitating or palliating factors, Maxalt was not effective, Toradol only minimally efficacious.  Sinus CT was completely negative for any sinus disease.  He feels moderately fatigued, mild achiness.  I reviewed the past medical history, family history, social history, surgical history, and allergies today and no changes were needed.  Please see the problem list section below in epic for further details.  Past Medical History: Past Medical History:  Diagnosis Date   Hand fracture    Hyperlipidemia    Lyme disease    Normal cardiac stress test 4/10   at [facility] - negative.  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   Food insecurity:    Worry: Not on file    Inability: Not on file   Transportation needs:    Medical: Not on file    Non-medical: Not on file  Tobacco Use   Smoking status: Former Smoker    Packs/day: 0.20    Types: Cigarettes   Smokeless tobacco: Former Systems developer    Types: Snuff    Quit date: 06/02/2011   Tobacco comment: started smoking at age 31, started smokeless tobacco at age 89  Substance and Sexual Activity   Alcohol use: No   Drug use: No   Sexual activity: Not on file  Lifestyle   Physical activity:    Days per week: Not on file    Minutes per session: Not on file   Stress: Not on file  Relationships   Social connections:   Talks on phone: Not on file    Gets together: Not on file    Attends religious service: Not on file    Active member of club or organization: Not on file    Attends meetings of clubs or organizations: Not on file    Relationship status: Not on file  Other Topics Concern   Not on file  Social History Narrative   17 years of education   Married to [place] with 1 son   Mother in Sports coach lives in the home   Family History: Family History  Problem Relation Age of Onset   Breast cancer Mother    Hyperlipidemia Mother    Hypertension Father    Hyperlipidemia Father    Other Other        grandfather with alcoholism   Heart attack Other        grandmother   Diabetes Other        grandmother   Colon cancer Other        uncle   Breast cancer Other        aunt   Lung cancer Other        grandmother   Prostate cancer Other        grandfather   Melanoma Other  uncle   Allergies: Allergies  Allergen Reactions   Sulfa Antibiotics Other (See Comments)    headaches   Naproxen Sodium    Medications: See med rec.  Review of Systems: No headache, visual changes, nausea, vomiting, diarrhea, constipation, dizziness, abdominal pain, skin rash, fevers, chills, night sweats, swollen lymph nodes, weight loss, chest pain, body aches, joint swelling, muscle aches, shortness of breath, mood changes, visual or auditory hallucinations.  Objective:    General: Well Developed, well nourished, and in no acute distress.  Neuro: Alert and oriented x3, extra-ocular muscles intact, sensation grossly intact. Cranial nerves II through XII are intact, motor, sensory, and coordinative functions are all intact. HEENT: Normocephalic, atraumatic, pupils equal round reactive to light, neck supple, no masses, no lymphadenopathy, thyroid nonpalpable. Oropharynx, nasopharynx, external ear canals are unremarkable, there was some scarring on the right tympanic membrane consistent with prior tube  placement. Skin: Warm and dry, no rashes noted.  Cardiac: Regular rate and rhythm, no murmurs rubs or gallops.  Respiratory: Clear to auscultation bilaterally. Not using accessory muscles, speaking in full sentences.  Abdominal: Soft, nontender, nondistended, positive bowel sounds, no masses, no organomegaly.  Musculoskeletal: Shoulder, elbow, wrist, hip, knee, ankle stable, and with full range of motion.  Using appropriate PPE I performed a bilateral nasopharyngeal swab for flu and COVID-19.  Impression and Recommendations:    The patient was counselled, risk factors were discussed, anticipatory guidance given.  Annual physical exam Annual physical as above is unremarkable.  Chronic headache Right-sided severe facial pain, radiation to the right ear, exam completely unremarkable, multiple uneventful visits with ENT. X-rays have been normal, maxillofacial CT is completely unremarkable with no evidence of sinus disease. Maxalt, Sprix not effective. Toradol helped only temporarily. Adding a brain MRI, referral to headache clinic. I do ultimately think this is going to be eustachian tube dysfunction, he does have a history of tympanostomy tube placement. Referral back to ENT to discuss this.   Exposure to Covid-19 Virus Patient claims exposure, having fatigue, swabbed today with appropriate PPE. Negative flu test today. Diaz will self quarantine until the results are back.   ___________________________________________ Gwen Her. Dianah Field, M.D., ABFM., CAQSM. Primary Care and Sports Medicine [facility]  Adjunct Professor of [place] [facility] [employer]

## 2019-02-01 ENCOUNTER — Encounter: Payer: Self-pay | Admitting: Sports Medicine

## 2019-02-01 LAB — COMPLETE METABOLIC PANEL WITH GFR
AG Ratio: 1.3 (calc) (ref 1.0–2.5)
ALT: 37 U/L (ref 9–46)
AST: 24 U/L (ref 10–40)
Albumin: 4.3 g/dL (ref 3.6–5.1)
Alkaline phosphatase (APISO): 43 U/L (ref 36–130)
BUN: 14 mg/dL (ref 7–25)
CO2: 26 mmol/L (ref 20–32)
Calcium: 9.7 mg/dL (ref 8.6–10.3)
Chloride: 104 mmol/L (ref 98–110)
Creat: 0.83 mg/dL (ref 0.60–1.35)
GFR, Est African American: 124 mL/min/{1.73_m2} (ref >=60)
GFR, Est Non African American: 107 mL/min/{1.73_m2} (ref >=60)
Globulin: 3.3 g/dL (calc) (ref 1.9–3.7)
Glucose, Bld: 103 mg/dL — ABNORMAL HIGH (ref 65–99)
Potassium: 4.4 mmol/L (ref 3.5–5.3)
Sodium: 138 mmol/L (ref 135–146)
Total Bilirubin: 0.5 mg/dL (ref 0.2–1.2)
Total Protein: 7.6 g/dL (ref 6.1–8.1)

## 2019-02-01 LAB — CBC
HCT: 46.8 % (ref 38.5–50.0)
Hemoglobin: 15.8 g/dL (ref 13.2–17.1)
MCH: 28.5 pg (ref 27.0–33.0)
MCHC: 33.8 g/dL (ref 32.0–36.0)
MCV: 84.5 fL (ref 80.0–100.0)
MPV: 10.2 fL (ref 7.5–12.5)
Platelets: 258 10*3/uL (ref 140–400)
RBC: 5.54 10*6/uL (ref 4.20–5.80)
RDW: 13 % (ref 11.0–15.0)
WBC: 5.4 10*3/uL (ref 3.8–10.8)

## 2019-02-01 LAB — LIPID PANEL W/REFLEX DIRECT LDL
Cholesterol: 228 mg/dL — ABNORMAL HIGH (ref <200)
HDL: 81 mg/dL (ref >=40)
LDL Cholesterol (Calc): 126 mg/dL (calc) — ABNORMAL HIGH
Non-HDL Cholesterol (Calc): 147 mg/dL (calc) — ABNORMAL HIGH (ref <130)
Total CHOL/HDL Ratio: 2.8 (calc) (ref <5.0)
Triglycerides: 99 mg/dL (ref <150)

## 2019-02-01 LAB — TSH: TSH: 2.05 mIU/L (ref 0.40–4.50)

## 2019-02-01 LAB — HEMOGLOBIN A1C
Hgb A1c MFr Bld: 5.4 % of total Hgb (ref <5.7)
Mean Plasma Glucose: 108 (calc)
eAG (mmol/L): 6 (calc)

## 2019-02-02 LAB — SARS-COV-2 RNA,(COVID-19) QUALITATIVE NAAT: SARS CoV2 RNA: NOT DETECTED

## 2019-02-03 ENCOUNTER — Other Ambulatory Visit: Payer: Self-pay | Admitting: Sports Medicine

## 2019-02-03 ENCOUNTER — Encounter: Payer: Self-pay | Admitting: Sports Medicine

## 2019-02-03 DIAGNOSIS — J0100 Acute maxillary sinusitis, unspecified: Secondary | ICD-10-CM

## 2019-02-04 ENCOUNTER — Telehealth: Payer: Self-pay | Admitting: Sports Medicine

## 2019-02-04 DIAGNOSIS — H938X1 Other specified disorders of right ear: Secondary | ICD-10-CM | POA: Diagnosis not present

## 2019-02-04 DIAGNOSIS — J0100 Acute maxillary sinusitis, unspecified: Secondary | ICD-10-CM

## 2019-02-04 MED ORDER — RIZATRIPTAN BENZOATE 10 MG PO TABS: 10.0000 mg | ORAL_TABLET | ORAL | 3 refills | Status: DC | PRN

## 2019-02-04 NOTE — Telephone Encounter (Signed)
Patient is requesting a refill on the rizatriptan (MAXALT) 10 MG tablet [[id number]]. States that he is not sure if it can be filled or how much he is even suppose to take. Please advise.

## 2019-02-04 NOTE — Telephone Encounter (Signed)
Yes, Janett Billow if you look at the medication list you will see that I refilled that yesterday.

## 2019-02-04 NOTE — Telephone Encounter (Signed)
Yes, but patient has requested more. Please contact and advise.

## 2019-02-04 NOTE — Telephone Encounter (Signed)
Patient states that per Dr.John Jannifer Franklin at [facility], [place], he was needing a contract CT SCAN of patients neck and ear area. Patient would like to do the MRI all in one visit as well. Please contact patient and advise.

## 2019-02-07 ENCOUNTER — Ambulatory Visit (INDEPENDENT_AMBULATORY_CARE_PROVIDER_SITE_OTHER): Payer: BLUE CROSS/BLUE SHIELD

## 2019-02-07 ENCOUNTER — Other Ambulatory Visit: Payer: Self-pay

## 2019-02-07 ENCOUNTER — Encounter: Payer: Self-pay | Admitting: Sports Medicine

## 2019-02-07 DIAGNOSIS — R51 Headache: Secondary | ICD-10-CM

## 2019-02-07 DIAGNOSIS — H9201 Otalgia, right ear: Secondary | ICD-10-CM | POA: Diagnosis not present

## 2019-02-07 DIAGNOSIS — R519 Headache, unspecified: Secondary | ICD-10-CM

## 2019-02-07 DIAGNOSIS — H538 Other visual disturbances: Secondary | ICD-10-CM | POA: Diagnosis not present

## 2019-02-07 DIAGNOSIS — G8929 Other chronic pain: Secondary | ICD-10-CM

## 2019-02-07 MED ORDER — GADOBUTROL 1 MMOL/ML IV SOLN
8.0000 mL | Freq: Once | INTRAVENOUS | Status: AC | PRN
  Administered 2019-02-07: 8 mL via INTRAVENOUS

## 2019-02-07 NOTE — Telephone Encounter (Signed)
That is fine, but [facility] just needs to order the test and coordinate it with his MRI today.  That part has nothing to do with me.

## 2019-02-08 ENCOUNTER — Ambulatory Visit: Payer: BLUE CROSS/BLUE SHIELD | Admitting: Sports Medicine

## 2019-02-08 ENCOUNTER — Encounter: Payer: Self-pay | Admitting: Sports Medicine

## 2019-02-08 DIAGNOSIS — F419 Anxiety disorder, unspecified: Secondary | ICD-10-CM

## 2019-02-08 DIAGNOSIS — F411 Generalized anxiety disorder: Secondary | ICD-10-CM

## 2019-02-08 MED ORDER — ALPRAZOLAM 0.5 MG PO TABS: 0.5000 mg | ORAL_TABLET | Freq: Two times a day (BID) | ORAL | 0 refills | Status: DC | PRN

## 2019-02-08 MED ORDER — BUSPIRONE HCL 5 MG PO TABS: 5.0000 mg | ORAL_TABLET | Freq: Three times a day (TID) | ORAL | 3 refills | Status: DC

## 2019-02-08 NOTE — Assessment & Plan Note (Signed)
Unremarkable exams multiple times with ENT. Normal brain MRI with contrast (punctate focus of susceptibility artifact, similar changes can be seen in migraines), unremarkable maxillofacial CT. No efficacy with Maxalt or Sprix. Toradol only helped temporarily. Thales and I have discussed that this may represent anxiety, he does get a sensation of tightness in his chest, neck, followed by headache and ear pain. He has used more alprazolam recently. Adding BuSpar 5 mg 3 times daily, refilling alprazolam. Adding behavioral therapy. Return to see me in 4 weeks, PHQ and GAD.

## 2019-02-08 NOTE — Progress Notes (Signed)
Subjective:    CC: Follow-up  HPI: We have had quite the experience of working up in his headaches.  He has had a negative maxillofacial CT, multiple appointments with ENT were unrevealing, we did a brain MRI with and without contrast without any answers.  On further questioning he does endorse a sensation of anxiety, chest tightness radiating up to his neck, followed by mild panic and concordant headaches.  He has been treated in the past with SSRIs, and he is currently taking occasional alprazolam.  His mood has changed lately at work.  He denies any suicidal or homicidal ideation.  I reviewed the past medical history, family history, social history, surgical history, and allergies today and no changes were needed.  Please see the problem list section below in epic for further details.  Past Medical History: Past Medical History:  Diagnosis Date  . Hand fracture   . Hyperlipidemia   . Lyme disease   . Normal cardiac stress test 4/10   at [facility] - negative.  terminated for fatigue   Past Surgical History: Past Surgical History:  Procedure Laterality Date  . surgery on right hand     Social History: Social History   Socioeconomic History  . Marital status: Married    Spouse name: Not on file  . Number of children: 1  . Years of education: Not on file  . Highest education level: Not on file  Occupational History  . Occupation: Quarry manager for [employer]: [employer]  . Financial resource strain: Not on file  . Food insecurity:    Worry: Not on file    Inability: Not on file  . Transportation needs:    Medical: Not on file    Non-medical: Not on file  Tobacco Use  . Smoking status: Former Smoker    Packs/day: 0.20    Types: Cigarettes  . Smokeless tobacco: Former Systems developer    Types: Snuff    Quit date: 06/02/2011  . Tobacco comment: started smoking at age 34, started smokeless tobacco at age 79  Substance and Sexual Activity  . Alcohol use: No  . Drug  use: No  . Sexual activity: Not on file  Lifestyle  . Physical activity:    Days per week: Not on file    Minutes per session: Not on file  . Stress: Not on file  Relationships  . Social connections:    Talks on phone: Not on file    Gets together: Not on file    Attends religious service: Not on file    Active member of club or organization: Not on file    Attends meetings of clubs or organizations: Not on file    Relationship status: Not on file  Other Topics Concern  . Not on file  Social History Narrative   17 years of education   Married to [place] with 1 son   Mother in law lives in the home   Family History: Family History  Problem Relation Age of Onset  . Breast cancer Mother   . Hyperlipidemia Mother   . Hypertension Father   . Hyperlipidemia Father   . Other Other        grandfather with alcoholism  . Heart attack Other        grandmother  . Diabetes Other        grandmother  . Colon cancer Other        uncle  . Breast cancer Other  aunt  . Lung cancer Other        grandmother  . Prostate cancer Other        grandfather  . Melanoma Other        uncle   Allergies: Allergies  Allergen Reactions  . Sulfa Antibiotics Other (See Comments)    headaches  . Naproxen Sodium    Medications: See med rec.  Review of Systems: No fevers, chills, night sweats, weight loss, chest pain, or shortness of breath.   Objective:    General: Well Developed, well nourished, and in no acute distress.  Neuro: Alert and oriented x3, extra-ocular muscles intact, sensation grossly intact.  HEENT: Normocephalic, atraumatic, pupils equal round reactive to light, neck supple, no masses, no lymphadenopathy, thyroid nonpalpable.  Skin: Warm and dry, no rashes. Cardiac: Regular rate and rhythm, no murmurs rubs or gallops, no lower extremity edema.  Respiratory: Clear to auscultation bilaterally. Not using accessory muscles, speaking in full sentences.  Impression and  Recommendations:    Generalized anxiety manifesting as headache Unremarkable exams multiple times with ENT. Normal brain MRI with contrast (punctate focus of susceptibility artifact, similar changes can be seen in migraines), unremarkable maxillofacial CT. No efficacy with Maxalt or Sprix. Toradol only helped temporarily. Brian Spears and I have discussed that this may represent anxiety, he does get a sensation of tightness in his chest, neck, followed by headache and ear pain. He has used more alprazolam recently. Adding BuSpar 5 mg 3 times daily, refilling alprazolam. Adding behavioral therapy. Return to see me in 4 weeks, PHQ and GAD.  I spent 25 minutes with this patient, greater than 50% was face-to-face time counseling regarding the above diagnoses.  ___________________________________________ Gwen Her. Dianah Field, M.D., ABFM., CAQSM. Primary Care and Sports Medicine [facility]  Adjunct Professor of [employer] [facility] [employer]

## 2019-02-09 ENCOUNTER — Encounter: Payer: Self-pay | Admitting: Sports Medicine

## 2019-02-11 ENCOUNTER — Ambulatory Visit (INDEPENDENT_AMBULATORY_CARE_PROVIDER_SITE_OTHER): Payer: BC Managed Care – PPO | Admitting: Professional

## 2019-02-11 DIAGNOSIS — F411 Generalized anxiety disorder: Secondary | ICD-10-CM | POA: Diagnosis not present

## 2019-02-16 ENCOUNTER — Ambulatory Visit (INDEPENDENT_AMBULATORY_CARE_PROVIDER_SITE_OTHER): Payer: BC Managed Care – PPO | Admitting: Professional

## 2019-02-16 DIAGNOSIS — F411 Generalized anxiety disorder: Secondary | ICD-10-CM

## 2019-02-23 ENCOUNTER — Encounter: Payer: Self-pay | Admitting: Sports Medicine

## 2019-02-23 ENCOUNTER — Ambulatory Visit (INDEPENDENT_AMBULATORY_CARE_PROVIDER_SITE_OTHER): Payer: BC Managed Care – PPO | Admitting: Professional

## 2019-02-23 ENCOUNTER — Telehealth: Payer: Self-pay

## 2019-02-23 DIAGNOSIS — F411 Generalized anxiety disorder: Secondary | ICD-10-CM

## 2019-02-24 ENCOUNTER — Other Ambulatory Visit: Payer: Self-pay | Admitting: Sports Medicine

## 2019-02-24 DIAGNOSIS — E7800 Pure hypercholesterolemia, unspecified: Secondary | ICD-10-CM

## 2019-03-02 ENCOUNTER — Ambulatory Visit (INDEPENDENT_AMBULATORY_CARE_PROVIDER_SITE_OTHER): Payer: BC Managed Care – PPO | Admitting: Professional

## 2019-03-02 ENCOUNTER — Other Ambulatory Visit: Payer: Self-pay | Admitting: Sports Medicine

## 2019-03-02 DIAGNOSIS — F411 Generalized anxiety disorder: Secondary | ICD-10-CM | POA: Diagnosis not present

## 2019-03-08 ENCOUNTER — Ambulatory Visit: Payer: BLUE CROSS/BLUE SHIELD | Admitting: Sports Medicine

## 2019-03-08 ENCOUNTER — Encounter: Payer: Self-pay | Admitting: Sports Medicine

## 2019-03-08 DIAGNOSIS — M722 Plantar fascial fibromatosis: Secondary | ICD-10-CM | POA: Diagnosis not present

## 2019-03-08 DIAGNOSIS — F419 Anxiety disorder, unspecified: Secondary | ICD-10-CM

## 2019-03-08 DIAGNOSIS — F411 Generalized anxiety disorder: Secondary | ICD-10-CM | POA: Diagnosis not present

## 2019-03-08 MED ORDER — BUSPIRONE HCL 10 MG PO TABS: 10.0000 mg | ORAL_TABLET | Freq: Three times a day (TID) | ORAL | 3 refills | Status: DC

## 2019-03-08 MED ORDER — ALPRAZOLAM 0.5 MG PO TABS: ORAL_TABLET | ORAL | 0 refills | Status: DC

## 2019-03-08 NOTE — Addendum Note (Signed)
Addended by: Silverio Decamp on: 03/08/2019 12:00 PM   Modules accepted: Orders

## 2019-03-08 NOTE — Assessment & Plan Note (Signed)
Repeat left plantar fascia injection, previous injection was approximately 2 months and 3 weeks ago.

## 2019-03-08 NOTE — Progress Notes (Addendum)
Subjective:    CC: Follow up  HPI: Brian Spears is a pleasant 43 year old male, he has been having some fatigue, anxiety, headaches.  Slightly improved with BuSpar, alprazolam, does not desire to go up on any of the dosages.  He had a virtual visit with neurology and has an in person visit coming up on Thursday.  He has had an extensive work-up thus far that was negative.  I reviewed the past medical history, family history, social history, surgical history, and allergies today and no changes were needed.  Please see the problem list section below in epic for further details.  Past Medical History: Past Medical History:  Diagnosis Date  . Hand fracture   . Hyperlipidemia   . Lyme disease   . Normal cardiac stress test 4/10   at [facility] - negative.  terminated for fatigue   Past Surgical History: Past Surgical History:  Procedure Laterality Date  . surgery on right hand     Social History: Social History   Socioeconomic History  . Marital status: Married    Spouse name: Not on file  . Number of children: 1  . Years of education: Not on file  . Highest education level: Not on file  Occupational History  . Occupation: Quarry manager for [employer]: [employer]  . Financial resource strain: Not on file  . Food insecurity    Worry: Not on file    Inability: Not on file  . Transportation needs    Medical: Not on file    Non-medical: Not on file  Tobacco Use  . Smoking status: Former Smoker    Packs/day: 0.20    Types: Cigarettes  . Smokeless tobacco: Former Systems developer    Types: Snuff    Quit date: 06/02/2011  . Tobacco comment: started smoking at age 36, started smokeless tobacco at age 41  Substance and Sexual Activity  . Alcohol use: No  . Drug use: No  . Sexual activity: Not on file  Lifestyle  . Physical activity    Days per week: Not on file    Minutes per session: Not on file  . Stress: Not on file  Relationships  . Social Herbalist on phone: Not  on file    Gets together: Not on file    Attends religious service: Not on file    Active member of club or organization: Not on file    Attends meetings of clubs or organizations: Not on file    Relationship status: Not on file  Other Topics Concern  . Not on file  Social History Narrative   17 years of education   Married to [place] with 1 son   Mother in law lives in the home   Family History: Family History  Problem Relation Age of Onset  . Breast cancer Mother   . Hyperlipidemia Mother   . Hypertension Father   . Hyperlipidemia Father   . Other Other        grandfather with alcoholism  . Heart attack Other        grandmother  . Diabetes Other        grandmother  . Colon cancer Other        uncle  . Breast cancer Other        aunt  . Lung cancer Other        grandmother  . Prostate cancer Other        grandfather  .  Melanoma Other        uncle   Allergies: Allergies  Allergen Reactions  . Sulfa Antibiotics Other (See Comments)    headaches  . Naproxen Sodium    Medications: See med rec.  Review of Systems: No fevers, chills, night sweats, weight loss, chest pain, or shortness of breath.   Objective:    General: Well Developed, well nourished, and in no acute distress.  Neuro: Alert and oriented x3, extra-ocular muscles intact, sensation grossly intact.  HEENT: Normocephalic, atraumatic, pupils equal round reactive to light, neck supple, no masses, no lymphadenopathy, thyroid nonpalpable.  Skin: Warm and dry, no rashes. Cardiac: Regular rate and rhythm, no murmurs rubs or gallops, no lower extremity edema.  Respiratory: Clear to auscultation bilaterally. Not using accessory muscles, speaking in full sentences.  Procedure: Real-time Ultrasound Guided injection of the left plantar fascial origin Device: GE Logiq E  Verbal informed consent obtained.  Time-out conducted.  Noted no overlying erythema, induration, or other signs of local infection.  Skin  prepped in a sterile fashion.  Local anesthesia: Topical Ethyl chloride.  With sterile technique and under real time ultrasound guidance:  25-gauge needle advanced just deep to the origin of the plantar fascia at the calcaneus, I then injected 1 cc Kenalog 40, 1 cc lidocaine, 1 cc bupivacaine. Completed without difficulty  Pain immediately resolved suggesting accurate placement of the medication.  Advised to call if fevers/chills, erythema, induration, drainage, or persistent bleeding.  Images permanently stored and available for review in the ultrasound unit.  Impression: Technically successful ultrasound guided injection.  Impression and Recommendations:    Plantar fasciitis, left Repeat left plantar fascia injection, previous injection was approximately 2 months and 3 weeks ago.  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 I have also recommended he back to an anti-inflammatory diet, high omega-3 and omega 6 fish oils. He did have an elevated antigliadin IgG antibody, so we have discussed in the past a low gluten diet. He also had an equivocally elevated Borrelia burgdorferi IgG suspicious for chronic Lyme disease, this has been treated in the past. I do think we need to enter more of a complementary and alternative approach. Increasing BuSpar to 10 mg 3 times daily. Refilling alprazolam, he uses a maximum of 2 pills daily. Still awaiting in person visit with neurology.   ___________________________________________ Gwen Her. Dianah Field, M.D., ABFM., CAQSM. Primary Care and Sports Medicine [facility]  Adjunct Professor of [employer] [facility] [employer]

## 2019-03-08 NOTE — Assessment & Plan Note (Addendum)
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 I have also recommended he back to an anti-inflammatory diet, high omega-3 and omega 6 fish oils. He did have an elevated antigliadin IgG antibody, so we have discussed in the past a low gluten diet. He also had an equivocally elevated Borrelia burgdorferi IgG suspicious for chronic Lyme disease, this has been treated in the past. I do think we need to enter more of a complementary and alternative approach. Increasing BuSpar to 10 mg 3 times daily. Refilling alprazolam, he uses a maximum of 2 pills daily. Still awaiting in person visit with neurology.

## 2019-03-09 ENCOUNTER — Ambulatory Visit (INDEPENDENT_AMBULATORY_CARE_PROVIDER_SITE_OTHER): Payer: BC Managed Care – PPO | Admitting: Professional

## 2019-03-09 DIAGNOSIS — F411 Generalized anxiety disorder: Secondary | ICD-10-CM

## 2019-03-10 ENCOUNTER — Encounter: Payer: Self-pay | Admitting: Neurology

## 2019-03-10 ENCOUNTER — Ambulatory Visit: Payer: BC Managed Care – PPO | Admitting: Neurology

## 2019-03-10 ENCOUNTER — Other Ambulatory Visit: Payer: Self-pay

## 2019-03-10 VITALS — BP 121/85 | HR 74 | Temp 97.5°F | Ht 68.0 in | Wt 199.0 lb

## 2019-03-10 DIAGNOSIS — IMO0002 Reserved for concepts with insufficient information to code with codable children: Secondary | ICD-10-CM

## 2019-03-10 DIAGNOSIS — G43709 Chronic migraine without aura, not intractable, without status migrainosus: Secondary | ICD-10-CM

## 2019-03-10 MED ORDER — SUMATRIPTAN SUCCINATE 100 MG PO TABS: 100.0000 mg | ORAL_TABLET | Freq: Once | ORAL | 6 refills | Status: DC | PRN

## 2019-03-10 MED ORDER — PROPRANOLOL HCL 40 MG PO TABS: 40.0000 mg | ORAL_TABLET | Freq: Two times a day (BID) | ORAL | 11 refills | Status: DC

## 2019-03-10 NOTE — Progress Notes (Signed)
PATIENT: Brian Spears DOB: Aug 23, 1976  Chief Complaint  Patient presents with  . Headache    Reports having a headache daily.  He has rizatripan for his more significant pain but they make him sleepy.  He has never been on any preventive medication.  Marland Kitchen PCP    Silverio Decamp, MD     HISTORICAL  Brian Spears is a 43 year old male, seen in request by his primary care physician Dr. Aundria Mems for evaluation of headaches, initial evaluation was on March 10, 2019.  I have reviewed and summarized the referring note from the referring physician.  He had a past medical history of depression anxiety, taking BuSpar 10 mg 3 times a day, Xanax as needed,  Ordered a history of headache since teenager, occasionally bilateral frontal pressure headaches, about once a month, but more severe headache, light noise sensitivity, pounding, ringing in ears,  He began to to notice increased headaches since June, bilateral frontal pressure sensation, reported mild low-grade fever 99, cough, also complains of foggy vision at the right visual field, he was given prescription of Maxalt 10 mg by his primary care physician, which works well for his headache, but he often felt to sleep after the Maxalt.  He also reported a history of Lyme disease, was treated in 2013 doxycycline, history of tic exposure, which did help his generalized fatigue, body achy pain he also reported a possible history of celiac disease, had extensive evaluation including blood test, colonoscopy,  Personally reviewed MRI of the brain without contrast February 07, 2019, there was no significant abnormality.  REVIEW OF SYSTEMS: Full 14 system review of systems performed and notable only for as above All other review of systems were negative.  ALLERGIES: Allergies  Allergen Reactions  . Sulfa Antibiotics Other (See Comments)    headaches  . Naproxen Sodium     HOME MEDICATIONS: Current Outpatient Medications  Medication  Sig Dispense Refill  . ALPRAZolam (XANAX) 0.5 MG tablet One half tab in the morning, one half tab midday, 1 tab p.o. nightly as needed 60 tablet 0  . atorvastatin (LIPITOR) 10 MG tablet TAKE 1 TABLET BY MOUTH EVERY DAY 90 tablet 3  . busPIRone (BUSPAR) 10 MG tablet Take 1 tablet (10 mg total) by mouth 3 (three) times daily. 90 tablet 3  . OMEGA-3 FATTY ACIDS PO Take by mouth.    . pantoprazole (PROTONIX) 40 MG tablet TAKE 1 TABLET BY MOUTH EVERY DAY 90 tablet 1  . Probiotic Product (PROBIOTIC DAILY PO) Take by mouth.    . rizatriptan (MAXALT) 10 MG tablet Take 1 tablet (10 mg total) by mouth as needed for migraine. May repeat in 2 hours if needed 10 tablet 3  . triamcinolone (NASACORT) 55 MCG/ACT AERO nasal inhaler Place 2 sprays into the nose daily. 1 Inhaler 11  . triamcinolone cream (KENALOG) 0.5 % Apply 1 application topically 2 (two) times daily. To affected areas. 30 g 3   No current facility-administered medications for this visit.     PAST MEDICAL HISTORY: Past Medical History:  Diagnosis Date  . Hand fracture   . Headache   . Hyperlipidemia   . Lyme disease   . Normal cardiac stress test 4/10   at [facility] - negative.  terminated for fatigue    PAST SURGICAL HISTORY: Past Surgical History:  Procedure Laterality Date  . surgery on right hand      FAMILY HISTORY: Family History  Problem Relation Age of Onset  .  Breast cancer Mother   . Hyperlipidemia Mother   . Hypertension Father   . Hyperlipidemia Father   . Other Other        grandfather with alcoholism  . Heart attack Other        grandmother  . Diabetes Other        grandmother  . Colon cancer Other        uncle  . Breast cancer Other        aunt  . Lung cancer Other        grandmother  . Prostate cancer Other        grandfather  . Melanoma Other        uncle    SOCIAL HISTORY: Social History   Socioeconomic History  . Marital status: Married    Spouse name: Not on file  . Number of children: 1  .  Years of education: college  . Highest education level: Not on file  Occupational History  . Occupation: Quarry manager for [employer]  . Financial resource strain: Not on file  . Food insecurity    Worry: Not on file    Inability: Not on file  . Transportation needs    Medical: Not on file    Non-medical: Not on file  Tobacco Use  . Smoking status: Former Smoker    Packs/day: 0.20    Types: Cigarettes  . Smokeless tobacco: Former Systems developer    Types: Snuff    Quit date: 06/02/2011  . Tobacco comment: started smoking at age 66, started smokeless tobacco at age 39  Substance and Sexual Activity  . Alcohol use: No  . Drug use: No  . Sexual activity: Not on file  Lifestyle  . Physical activity    Days per week: Not on file    Minutes per session: Not on file  . Stress: Not on file  Relationships  . Social Herbalist on phone: Not on file    Gets together: Not on file    Attends religious service: Not on file    Active member of club or organization: Not on file    Attends meetings of clubs or organizations: Not on file    Relationship status: Not on file  . Intimate partner violence    Fear of current or ex partner: Not on file    Emotionally abused: Not on file    Physically abused: Not on file    Forced sexual activity: Not on file  Other Topics Concern  . Not on file  Social History Narrative   17 years of education   Married to [place] with 1 son   Mother in law lives in the home   Right-handed.   No daily caffeine use.      PHYSICAL EXAM   Vitals:   03/10/19 0940  BP: 121/85  Pulse: 74  Temp: (!) 97.5 F (36.4 C)  Weight: 199 lb (90.3 kg)  Height: 5\' 8"  (1.727 m)    Not recorded      Body mass index is 30.26 kg/m.  PHYSICAL EXAMNIATION:  Gen: NAD, conversant, well nourised, obese, well groomed                     Cardiovascular: Regular rate rhythm, no peripheral edema, warm, nontender. Eyes: Conjunctivae clear without exudates or  hemorrhage Neck: Supple, no carotid bruits. Pulmonary: Clear to auscultation bilaterally   NEUROLOGICAL EXAM:  MENTAL STATUS: Speech:  Speech is normal; fluent and spontaneous with normal comprehension.  Cognition:     Orientation to time, place and person     Normal recent and remote memory     Normal Attention span and concentration     Normal Language, naming, repeating,spontaneous speech     Fund of knowledge   CRANIAL NERVES: CN II: Visual fields are full to confrontation.  Pupils are round equal and briskly reactive to light. CN III, IV, VI: extraocular movement are normal. No ptosis. CN V: Facial sensation is intact to pinprick in all 3 divisions bilaterally. Corneal responses are intact.  CN VII: Face is symmetric with normal eye closure and smile. CN VIII: Hearing is normal to rubbing fingers CN IX, X: Palate elevates symmetrically. Phonation is normal. CN XI: Head turning and shoulder shrug are intact CN XII: Tongue is midline with normal movements and no atrophy.  MOTOR: There is no pronator drift of out-stretched arms. Muscle bulk and tone are normal. Muscle strength is normal.  REFLEXES: Reflexes are 2+ and symmetric at the biceps, triceps, knees, and ankles. Plantar responses are flexor.  SENSORY: Intact to light touch, pinprick, positional sensation and vibratory sensation are intact in fingers and toes.  COORDINATION: Rapid alternating movements and fine finger movements are intact. There is no dysmetria on finger-to-nose and heel-knee-shin.    GAIT/STANCE: Posture is normal. Gait is steady with normal steps, base, arm swing, and turning. Heel and toe walking are normal. Tandem gait is normal.  Romberg is absent.   DIAGNOSTIC DATA (LABS, IMAGING, TESTING) - I reviewed patient records, labs, notes, testing and imaging myself where available.   ASSESSMENT AND PLAN  Brian Spears is a 43 y.o. male   Chronic migraine headaches  Propanolol 40 mg twice a  day as preventive medications  Reported excessive drowsiness with Maxalt, will try Imitrex as needed,   Marcial Pacas, M.D. Ph.D.  [facility] [address] [place], [place] [address] Ph: [phone] Fax: [phone]  CC: Silverio Decamp, MD

## 2019-03-15 DIAGNOSIS — M546 Pain in thoracic spine: Secondary | ICD-10-CM | POA: Diagnosis not present

## 2019-03-15 DIAGNOSIS — M542 Cervicalgia: Secondary | ICD-10-CM | POA: Diagnosis not present

## 2019-03-15 DIAGNOSIS — S161XXA Strain of muscle, fascia and tendon at neck level, initial encounter: Secondary | ICD-10-CM | POA: Diagnosis not present

## 2019-03-15 DIAGNOSIS — M9901 Segmental and somatic dysfunction of cervical region: Secondary | ICD-10-CM | POA: Diagnosis not present

## 2019-03-16 ENCOUNTER — Ambulatory Visit: Payer: BC Managed Care – PPO | Admitting: Professional

## 2019-03-21 ENCOUNTER — Other Ambulatory Visit: Payer: Self-pay | Admitting: Sports Medicine

## 2019-03-25 ENCOUNTER — Telehealth (INDEPENDENT_AMBULATORY_CARE_PROVIDER_SITE_OTHER): Payer: BC Managed Care – PPO | Admitting: Sports Medicine

## 2019-03-25 ENCOUNTER — Encounter: Payer: Self-pay | Admitting: Sports Medicine

## 2019-03-25 DIAGNOSIS — Z8619 Personal history of other infectious and parasitic diseases: Secondary | ICD-10-CM | POA: Diagnosis not present

## 2019-03-25 DIAGNOSIS — F411 Generalized anxiety disorder: Secondary | ICD-10-CM | POA: Diagnosis not present

## 2019-03-25 DIAGNOSIS — K900 Celiac disease: Secondary | ICD-10-CM | POA: Diagnosis not present

## 2019-03-25 NOTE — Assessment & Plan Note (Signed)
Persistent on episodes of abdominal bloating, feelings of full body myalgias. He did have a positive antigliadin IgG. He has eliminated gluten from his diet. We have tried to control his anxiety, though some of this is likely irritable bowel syndrome. I do think we need to involve integrative medicine.

## 2019-03-25 NOTE — Assessment & Plan Note (Addendum)
At the last visit we increased his BuSpar to 10 mg 3 times daily. He had a panic attack described as a sensation of impending doom and decreased it back to 5 mg 3 times daily. We have tried an anti-inflammatory diet, low gluten. He has an equivocal Borrelia burgdorferi IgG, this has been treated in the past, I would like an opinion from integrative medicine.

## 2019-03-25 NOTE — Assessment & Plan Note (Signed)
Elevated IgG several years ago, he was treated with doxycycline. Continues to have episodes of myalgias, I do think a great deal of this is related to his uncontrolled anxiety, the rest of his rheumatoid work-up has been negative. He did have a positive antigliadin IgG, there has been a partial improvement with cutting out gluten from his diet. At this point I do think we need to involve integrative medicine.

## 2019-03-25 NOTE — Progress Notes (Signed)
Virtual Visit via WebEx/MyChart   I connected with  Brian Spears  on 03/25/19 via WebEx/MyChart/Doximity Video and verified that I am speaking with the correct person using two identifiers.   I discussed the limitations, risks, security and privacy concerns of performing an evaluation and management service by WebEx/MyChart/Doximity Video, including the higher likelihood of inaccurate diagnosis and treatment, and the availability of in person appointments.  We also discussed the likely need of an additional face to face encounter for complete and high quality delivery of care.  I also discussed with the patient that there may be a patient responsible charge related to this service. The patient expressed understanding and wishes to proceed.  Provider location is either at home or medical facility. Patient location is at their home, different from provider location. People involved in care of the patient during this telehealth encounter were myself, my nurse/medical assistant, and my front office/scheduling team member.  Subjective:    CC: Vague symptoms  HPI: Brian Spears is a very complex case, he has anxiety, depression, he has a history of Lyme disease with positive Borrelia burgdorferi IgG, this was treated with doxycycline.  He has a history of gluten sensitivity, likely celiac disease with a positive gliadin IgG.  We have been treating her for anxiety and depression, he has been doing a good job maintaining his diet.  Unfortunately continues to have on symptoms including headaches, bloating, persistent anxiety, and occasional myalgias.  I reviewed the past medical history, family history, social history, surgical history, and allergies today and no changes were needed.  Please see the problem list section below in epic for further details.  Past Medical History: Past Medical History:  Diagnosis Date  . Hand fracture   . Headache   . Hyperlipidemia   . Lyme disease   . Normal cardiac stress  test 4/10   at [facility] - negative.  terminated for fatigue   Past Surgical History: Past Surgical History:  Procedure Laterality Date  . surgery on right hand     Social History: Social History   Socioeconomic History  . Marital status: Married    Spouse name: Not on file  . Number of children: 1  . Years of education: college  . Highest education level: Not on file  Occupational History  . Occupation: Quarry manager for [employer]  . Financial resource strain: Not on file  . Food insecurity    Worry: Not on file    Inability: Not on file  . Transportation needs    Medical: Not on file    Non-medical: Not on file  Tobacco Use  . Smoking status: Former Smoker    Packs/day: 0.20    Types: Cigarettes  . Smokeless tobacco: Former Systems developer    Types: Snuff    Quit date: 06/02/2011  . Tobacco comment: started smoking at age 27, started smokeless tobacco at age 18  Substance and Sexual Activity  . Alcohol use: No  . Drug use: No  . Sexual activity: Not on file  Lifestyle  . Physical activity    Days per week: Not on file    Minutes per session: Not on file  . Stress: Not on file  Relationships  . Social Herbalist on phone: Not on file    Gets together: Not on file    Attends religious service: Not on file    Active member of club or organization: Not on file    Attends  meetings of clubs or organizations: Not on file    Relationship status: Not on file  Other Topics Concern  . Not on file  Social History Narrative   17 years of education   Married to [place] with 1 son   Mother in law lives in the home   Right-handed.   No daily caffeine use.    Family History: Family History  Problem Relation Age of Onset  . Breast cancer Mother   . Hyperlipidemia Mother   . Hypertension Father   . Hyperlipidemia Father   . Other Other        grandfather with alcoholism  . Heart attack Other        grandmother  . Diabetes Other        grandmother  . Colon  cancer Other        uncle  . Breast cancer Other        aunt  . Lung cancer Other        grandmother  . Prostate cancer Other        grandfather  . Melanoma Other        uncle   Allergies: Allergies  Allergen Reactions  . Sulfa Antibiotics Other (See Comments)    headaches  . Naproxen Sodium    Medications: See med rec.  Review of Systems: No fevers, chills, night sweats, weight loss, chest pain, or shortness of breath.   Objective:    General: Speaking full sentences, no audible heavy breathing.  Sounds alert and appropriately interactive.  Appears well.  Face symmetric.  Extraocular movements intact.  Pupils equal and round.  No nasal flaring or accessory muscle use visualized.  No other physical exam performed due to the non-physical nature of this visit.  Impression and Recommendations:    Celiac disease Persistent on episodes of abdominal bloating, feelings of full body myalgias. He did have a positive antigliadin IgG. He has eliminated gluten from his diet. We have tried to control his anxiety, though some of this is likely irritable bowel syndrome. I do think we need to involve integrative medicine.  History of Lyme disease Elevated IgG several years ago, he was treated with doxycycline. Continues to have episodes of myalgias, I do think a great deal of this is related to his uncontrolled anxiety, the rest of his rheumatoid work-up has been negative. He did have a positive antigliadin IgG, there has been a partial improvement with cutting out gluten from his diet. At this point I do think we need to involve integrative medicine.  Generalized anxiety manifesting as headache At the last visit we increased his BuSpar to 10 mg 3 times daily. He had a panic attack described as a sensation of impending doom and decreased it back to 5 mg 3 times daily. We have tried an anti-inflammatory diet, low gluten. He has an equivocal Borrelia burgdorferi IgG, this has been treated  in the past, I would like an opinion from integrative medicine.  I discussed the above assessment and treatment plan with the patient. The patient was provided an opportunity to ask questions and all were answered. The patient agreed with the plan and demonstrated an understanding of the instructions.   The patient was advised to call back or seek an in-person evaluation if the symptoms worsen or if the condition fails to improve as anticipated.   I provided 25 minutes of non-face-to-face time during this encounter, 15 minutes of additional time was needed to gather information, review chart,  records, communicate/coordinate with staff remotely, troubleshooting the multiple errors that we get every time when trying to do video calls through the electronic medical record, WebEx, and Doximity, restart the encounter multiple times due to instability of the software, as well as complete documentation.   ___________________________________________ Gwen Her. Dianah Field, M.D., ABFM., CAQSM. Primary Care and Sports Medicine [facility]  Adjunct Professor of [employer] [facility] [employer] [facility] [employer]

## 2019-03-30 ENCOUNTER — Ambulatory Visit (INDEPENDENT_AMBULATORY_CARE_PROVIDER_SITE_OTHER): Payer: BC Managed Care – PPO | Admitting: Professional

## 2019-03-30 DIAGNOSIS — F411 Generalized anxiety disorder: Secondary | ICD-10-CM | POA: Diagnosis not present

## 2019-04-13 ENCOUNTER — Ambulatory Visit (INDEPENDENT_AMBULATORY_CARE_PROVIDER_SITE_OTHER): Payer: BC Managed Care – PPO | Admitting: Professional

## 2019-04-13 DIAGNOSIS — F411 Generalized anxiety disorder: Secondary | ICD-10-CM | POA: Diagnosis not present

## 2019-04-14 ENCOUNTER — Other Ambulatory Visit: Payer: Self-pay

## 2019-04-14 ENCOUNTER — Inpatient Hospital Stay: Payer: BC Managed Care – PPO | Attending: Internal Medicine | Admitting: Internal Medicine

## 2019-04-14 DIAGNOSIS — Z801 Family history of malignant neoplasm of trachea, bronchus and lung: Secondary | ICD-10-CM | POA: Diagnosis not present

## 2019-04-14 DIAGNOSIS — E785 Hyperlipidemia, unspecified: Secondary | ICD-10-CM | POA: Diagnosis not present

## 2019-04-14 DIAGNOSIS — A6920 Lyme disease, unspecified: Secondary | ICD-10-CM | POA: Diagnosis not present

## 2019-04-14 DIAGNOSIS — R51 Headache: Secondary | ICD-10-CM | POA: Diagnosis not present

## 2019-04-14 DIAGNOSIS — Z79899 Other long term (current) drug therapy: Secondary | ICD-10-CM | POA: Insufficient documentation

## 2019-04-14 DIAGNOSIS — Z8042 Family history of malignant neoplasm of prostate: Secondary | ICD-10-CM | POA: Diagnosis not present

## 2019-04-14 DIAGNOSIS — Z803 Family history of malignant neoplasm of breast: Secondary | ICD-10-CM | POA: Diagnosis not present

## 2019-04-14 DIAGNOSIS — Z87891 Personal history of nicotine dependence: Secondary | ICD-10-CM | POA: Insufficient documentation

## 2019-04-14 DIAGNOSIS — Z800 Family history of malignant neoplasm of digestive organs: Secondary | ICD-10-CM | POA: Diagnosis not present

## 2019-04-14 DIAGNOSIS — R519 Headache, unspecified: Secondary | ICD-10-CM

## 2019-04-15 ENCOUNTER — Telehealth: Payer: Self-pay | Admitting: Internal Medicine

## 2019-04-15 DIAGNOSIS — R519 Headache, unspecified: Secondary | ICD-10-CM | POA: Insufficient documentation

## 2019-04-15 NOTE — Progress Notes (Signed)
[facility] [place], [place] [address] [phone]   New Patient Evaluation  Date of Service: 04/15/19 Patient Name: Brian Spears Patient MRN: [id number] Patient DOB: 1976/04/22 Provider: Ventura Sellers, MD  Identifying Statement:  Brian Spears is a 43 y.o. male with headaches  Referring Provider: Silverio Decamp, [place],  [place] [address]  History of Present Illness: The patient's records from the referring physician were obtained and reviewed and the patient interviewed to confirm this HPI.  [name] presents today to review recent headache syndrome, as well as evaluation of recent MRI brain.  He describes a syndrome of 8/10 pressure-like pain, both in middle of forehead and back of head without radiation.  These headaches occur spontaneously, may last for hours or an entire day.  There are no migrainous features such as photophobia, nausea/vomiting. They occur on a daily basis for the past 2-3 months.  Preceding onset of headache syndrome, he had a flu-like illness respiratory infection.  He does not have personal history of headaches or any migraines in the family.  For pain, he has been dosing ibuprofen daily for the past 3 months.  Medications: Current Outpatient Medications on File Prior to Visit  Medication Sig Dispense Refill  . ALPRAZolam (XANAX) 0.5 MG tablet One half tab in the morning, one half tab midday, 1 tab p.o. nightly as needed 60 tablet 0  . atorvastatin (LIPITOR) 10 MG tablet TAKE 1 TABLET BY MOUTH EVERY DAY 90 tablet 3  . busPIRone (BUSPAR) 10 MG tablet Take 1 tablet (10 mg total) by mouth 3 (three) times daily. 90 tablet 3  . OMEGA-3 FATTY ACIDS PO Take by mouth.    . pantoprazole (PROTONIX) 40 MG tablet TAKE 1 TABLET BY MOUTH EVERY DAY 90 tablet 1  . Probiotic Product (PROBIOTIC DAILY PO) Take by mouth.    . propranolol (INDERAL) 40 MG tablet Take 1 tablet (40  mg total) by mouth 2 (two) times daily. 60 tablet 11  . rizatriptan (MAXALT) 10 MG tablet Take 1 tablet (10 mg total) by mouth as needed for migraine. May repeat in 2 hours if needed 10 tablet 3  . triamcinolone cream (KENALOG) 0.5 % Apply 1 application topically 2 (two) times daily. To affected areas. 30 g 3  . triamcinolone (NASACORT) 55 MCG/ACT AERO nasal inhaler Place 2 sprays into the nose daily. (Patient not taking: Reported on 04/14/2019) 1 Inhaler 11   No current facility-administered medications on file prior to visit.     Allergies:  Allergies  Allergen Reactions  . Sulfa Antibiotics Other (See Comments)    headaches  . Naproxen Sodium    Past Medical History:  Past Medical History:  Diagnosis Date  . Hand fracture   . Headache   . Hyperlipidemia   . Lyme disease   . Normal cardiac stress test 4/10   at [facility] - negative.  terminated for fatigue   Past Surgical History:  Past Surgical History:  Procedure Laterality Date  . surgery on right hand     Social History:  Social History   Socioeconomic History  . Marital status: Married    Spouse name: Not on file  . Number of children: 1  . Years of education: college  . Highest education level: Not on file  Occupational History  . Occupation: Quarry manager for [employer]  . Financial resource strain: Not  on file  . Food insecurity    Worry: Not on file    Inability: Not on file  . Transportation needs    Medical: Not on file    Non-medical: Not on file  Tobacco Use  . Smoking status: Former Smoker    Packs/day: 0.20    Types: Cigarettes  . Smokeless tobacco: Former Systems developer    Types: Snuff    Quit date: 06/02/2011  . Tobacco comment: started smoking at age 30, started smokeless tobacco at age 54  Substance and Sexual Activity  . Alcohol use: No  . Drug use: No  . Sexual activity: Not on file  Lifestyle  . Physical activity    Days per week: Not on file    Minutes per session: Not on file  . Stress:  Not on file  Relationships  . Social Herbalist on phone: Not on file    Gets together: Not on file    Attends religious service: Not on file    Active member of club or organization: Not on file    Attends meetings of clubs or organizations: Not on file    Relationship status: Not on file  . Intimate partner violence    Fear of current or ex partner: Not on file    Emotionally abused: Not on file    Physically abused: Not on file    Forced sexual activity: Not on file  Other Topics Concern  . Not on file  Social History Narrative   17 years of education   Married to [place] with 1 son   Mother in law lives in the home   Right-handed.   No daily caffeine use.    Family History:  Family History  Problem Relation Age of Onset  . Breast cancer Mother   . Hyperlipidemia Mother   . Hypertension Father   . Hyperlipidemia Father   . Other Other        grandfather with alcoholism  . Heart attack Other        grandmother  . Diabetes Other        grandmother  . Colon cancer Other        uncle  . Breast cancer Other        aunt  . Lung cancer Other        grandmother  . Prostate cancer Other        grandfather  . Melanoma Other        uncle    Review of Systems: Constitutional: Denies fevers, chills or abnormal weight loss Eyes: Denies blurriness of vision Ears, nose, mouth, throat, and face: Denies mucositis or sore throat Respiratory: Denies cough, dyspnea or wheezes Cardiovascular: Denies palpitation, chest discomfort or lower extremity swelling Gastrointestinal:  Denies nausea, constipation, diarrhea GU: Denies dysuria or incontinence Skin: Denies abnormal skin rashes Neurological: Per HPI Musculoskeletal: Denies joint pain, back or neck discomfort. No decrease in ROM Behavioral/Psych: Denies anxiety, disturbance in thought content, and mood instability  Physical Exam: Vitals:   04/14/19 0916  BP: 105/76  Pulse: 64  Resp: 17  Temp: 98.3 F (36.8  C)  SpO2: 100%   KPS: 100. General: Alert, cooperative, pleasant, in no acute distress Head: Craniotomy scar noted, dry and intact. EENT: No conjunctival injection or scleral icterus. Oral mucosa moist Lungs: Resp effort normal Cardiac: Regular rate and rhythm Abdomen: Soft, non-distended abdomen Skin: No rashes cyanosis or petechiae. Extremities: No clubbing or edema  Neurologic Exam:  Mental Status: Awake, alert, attentive to examiner. Oriented to self and environment. Language is fluent with intact comprehension.  Cranial Nerves: Visual acuity is grossly normal. Visual fields are full. Extra-ocular movements intact. No ptosis. Face is symmetric, tongue midline. Motor: Tone and bulk are normal. Power is full in both arms and legs. Reflexes are symmetric, no pathologic reflexes present. Intact finger to nose bilaterally Sensory: Intact to light touch and temperature Gait: Normal and tandem gait is normal.   Labs: I have reviewed the data as listed    Component Value Date/Time   NA 138 01/31/2019 0934   K 4.4 01/31/2019 0934   CL 104 01/31/2019 0934   CO2 26 01/31/2019 0934   GLUCOSE 103 (H) 01/31/2019 0934   BUN 14 01/31/2019 0934   CREATININE 0.83 01/31/2019 0934   CALCIUM 9.7 01/31/2019 0934   PROT 7.6 01/31/2019 0934   ALBUMIN 4.1 02/05/2017 0932   ALBUMIN 4.1 02/05/2017 0932   AST 24 01/31/2019 0934   ALT 37 01/31/2019 0934   ALKPHOS 45 02/05/2017 0932   BILITOT 0.5 01/31/2019 0934   GFRNONAA 107 01/31/2019 0934   GFRAA 124 01/31/2019 0934   Lab Results  Component Value Date   WBC 5.4 01/31/2019   NEUTROABS 2,053 11/16/2017   HGB 15.8 01/31/2019   HCT 46.8 01/31/2019   MCV 84.5 01/31/2019   PLT 258 01/31/2019    Imaging: ADDENDUM REPORT: 02/07/2019 15:42  ADDENDUM: There is a single punctate focus of susceptibility artifact in the subcortical white matter of the anterior right frontal lobe possibly reflecting a chronic microhemorrhage though  nonspecific in isolation.   Electronically Signed   By: Logan Bores M.D.   On: 02/07/2019 15:42   Addended by Logan Bores, MD on 02/07/2019 3:45 PM    Study Result  CLINICAL DATA:  Headache for 4 weeks. Pain and pressure in the right ear. Right-sided neck tension. Right eye blurry vision.  EXAM: MRI HEAD WITHOUT AND WITH CONTRAST  TECHNIQUE: Multiplanar, multiecho pulse sequences of the brain and surrounding structures were obtained without and with intravenous contrast.  CONTRAST:  8 mL Gadavist  COMPARISON:  None.  FINDINGS: Brain: There is no evidence of acute infarct, intracranial hemorrhage, mass, midline shift, or extra-axial fluid collection. The ventricles and sulci are normal. The brain is normal in signal. No abnormal enhancement is identified.  Vascular: Major intracranial vascular flow voids are preserved.  Skull and upper cervical spine: Unremarkable bone marrow signal.  Sinuses/Orbits: Unremarkable orbits. Paranasal sinuses and mastoid air cells are clear.  Other: None.  IMPRESSION: Negative brain MRI.  Electronically Signed: By: Logan Bores M.D. On: 02/07/2019 14:13      Assessment/Plan Chronic Daily Headache  We appreciate the opportunity to participate in the care of DAMEIAN CRISMAN.  His clinical syndrome is c/w chronic daily headache provoked or exacerbated by medication/analgesia overuse.  History is complicated by chronic lyme, psychiatric comorbidity.  We recommended slowly and carefully weaning ibuprofen.  We offered solumedrol "bridge" therapy to ease this transition, patient declined for now.  He should continue inderal and maxalt for prophylaxis and breakthrough headaches.   We reviewed his MRI brain at bedside which demonstrates a small focus of GRE signal not c/w clinically meaningful pathology.  All questions were answered. We are happy to follow up with him as needed, or he may continue to follow with his  neurologist as scheduled. The patient knows to call the clinic with any problems, questions or concerns. No barriers to  learning were detected.  The total time spent in the encounter was 45 minutes and more than 50% was on counseling and review of test results   Ventura Sellers, MD Medical Director of Neuro-[facility] [place] 04/15/19 2:56 PM

## 2019-04-15 NOTE — Telephone Encounter (Signed)
No los per 8/13.

## 2019-04-22 ENCOUNTER — Other Ambulatory Visit: Payer: Self-pay | Admitting: Sports Medicine

## 2019-04-22 DIAGNOSIS — F411 Generalized anxiety disorder: Secondary | ICD-10-CM

## 2019-04-22 DIAGNOSIS — F419 Anxiety disorder, unspecified: Secondary | ICD-10-CM

## 2019-04-22 MED ORDER — ALPRAZOLAM 0.5 MG PO TABS: ORAL_TABLET | ORAL | 0 refills | Status: DC

## 2019-04-27 ENCOUNTER — Encounter: Payer: Self-pay | Admitting: Sports Medicine

## 2019-05-06 ENCOUNTER — Encounter: Payer: Self-pay | Admitting: Internal Medicine

## 2019-05-11 ENCOUNTER — Ambulatory Visit (INDEPENDENT_AMBULATORY_CARE_PROVIDER_SITE_OTHER): Payer: BC Managed Care – PPO | Admitting: Professional

## 2019-05-11 DIAGNOSIS — F411 Generalized anxiety disorder: Secondary | ICD-10-CM

## 2019-05-12 ENCOUNTER — Ambulatory Visit: Payer: BC Managed Care – PPO | Admitting: Neurology

## 2019-05-16 ENCOUNTER — Ambulatory Visit (INDEPENDENT_AMBULATORY_CARE_PROVIDER_SITE_OTHER): Payer: BC Managed Care – PPO | Admitting: Sports Medicine

## 2019-05-16 ENCOUNTER — Other Ambulatory Visit: Payer: Self-pay

## 2019-05-16 ENCOUNTER — Encounter: Payer: Self-pay | Admitting: Sports Medicine

## 2019-05-16 DIAGNOSIS — G43709 Chronic migraine without aura, not intractable, without status migrainosus: Secondary | ICD-10-CM | POA: Diagnosis not present

## 2019-05-16 DIAGNOSIS — F411 Generalized anxiety disorder: Secondary | ICD-10-CM | POA: Diagnosis not present

## 2019-05-16 DIAGNOSIS — Z8619 Personal history of other infectious and parasitic diseases: Secondary | ICD-10-CM | POA: Diagnosis not present

## 2019-05-16 DIAGNOSIS — IMO0002 Reserved for concepts with insufficient information to code with codable children: Secondary | ICD-10-CM

## 2019-05-16 DIAGNOSIS — M19022 Primary osteoarthritis, left elbow: Secondary | ICD-10-CM

## 2019-05-16 DIAGNOSIS — E7800 Pure hypercholesterolemia, unspecified: Secondary | ICD-10-CM

## 2019-05-16 DIAGNOSIS — M19021 Primary osteoarthritis, right elbow: Secondary | ICD-10-CM | POA: Diagnosis not present

## 2019-05-16 NOTE — Assessment & Plan Note (Signed)
Continues to have uncontrolled anxiety, he does need to continue with alprazolam and BuSpar 5 twice daily.

## 2019-05-16 NOTE — Progress Notes (Signed)
Subjective:    CC: Follow-up  HPI: Right elbow pain: Known osteoarthritis, pain is moderate, persistent, localized to the joint line, last injection was September 2019.  Lyme disease: Chibueze continues to complain of not feeling himself, he has headaches, he has a bit of residual right-sided Bell's palsy.  This is all due to suspected Lyme's disease that was treated.  He did have a positive IgG.  We have tried to get him in with complementary and alternative/integrative medicine without success.  Anxiety: Doing better with occasional alprazolam and BuSpar 5 twice daily.  He did not tolerate 10 mg twice daily.  I reviewed the past medical history, family history, social history, surgical history, and allergies today and no changes were needed.  Please see the problem list section below in epic for further details.  Past Medical History: Past Medical History:  Diagnosis Date  . Hand fracture   . Headache   . Hyperlipidemia   . Lyme disease   . Normal cardiac stress test 4/10   at [facility] - negative.  terminated for fatigue   Past Surgical History: Past Surgical History:  Procedure Laterality Date  . surgery on right hand     Social History: Social History   Socioeconomic History  . Marital status: Married    Spouse name: Not on file  . Number of children: 1  . Years of education: college  . Highest education level: Not on file  Occupational History  . Occupation: Quarry manager for [employer]  . Financial resource strain: Not on file  . Food insecurity    Worry: Not on file    Inability: Not on file  . Transportation needs    Medical: Not on file    Non-medical: Not on file  Tobacco Use  . Smoking status: Former Smoker    Packs/day: 0.20    Types: Cigarettes  . Smokeless tobacco: Former Systems developer    Types: Snuff    Quit date: 06/02/2011  . Tobacco comment: started smoking at age 24, started smokeless tobacco at age 89  Substance and Sexual Activity  . Alcohol use: No   . Drug use: No  . Sexual activity: Not on file  Lifestyle  . Physical activity    Days per week: Not on file    Minutes per session: Not on file  . Stress: Not on file  Relationships  . Social Herbalist on phone: Not on file    Gets together: Not on file    Attends religious service: Not on file    Active member of club or organization: Not on file    Attends meetings of clubs or organizations: Not on file    Relationship status: Not on file  Other Topics Concern  . Not on file  Social History Narrative   17 years of education   Married to [place] with 1 son   Mother in law lives in the home   Right-handed.   No daily caffeine use.    Family History: Family History  Problem Relation Age of Onset  . Breast cancer Mother   . Hyperlipidemia Mother   . Hypertension Father   . Hyperlipidemia Father   . Other Other        grandfather with alcoholism  . Heart attack Other        grandmother  . Diabetes Other        grandmother  . Colon cancer Other  uncle  . Breast cancer Other        aunt  . Lung cancer Other        grandmother  . Prostate cancer Other        grandfather  . Melanoma Other        uncle   Allergies: Allergies  Allergen Reactions  . Sulfa Antibiotics Other (See Comments)    headaches  . Naproxen Sodium    Medications: See med rec.  Review of Systems: No fevers, chills, night sweats, weight loss, chest pain, or shortness of breath.   Objective:    General: Well Developed, well nourished, and in no acute distress.  Neuro: Alert and oriented x3, extra-ocular muscles intact, sensation grossly intact.  HEENT: Normocephalic, atraumatic, pupils equal round reactive to light, neck supple, no masses, no lymphadenopathy, thyroid nonpalpable.  Skin: Warm and dry, no rashes. Cardiac: Regular rate and rhythm, no murmurs rubs or gallops, no lower extremity edema.  Respiratory: Clear to auscultation bilaterally. Not using accessory muscles,  speaking in full sentences.  Procedure: Real-time Ultrasound Guided injection of the right elbow Device: GE Logiq E  Verbal informed consent obtained.  Time-out conducted.  Noted no overlying erythema, induration, or other signs of local infection.  Skin prepped in a sterile fashion.  Local anesthesia: Topical Ethyl chloride.  With sterile technique and under real time ultrasound guidance:  25-gauge needle advanced through the anconeus into the joint, I then injected 1 cc Kenalog 40, 1 cc lidocaine, 1 cc bupivacaine. Completed without difficulty  Pain immediately resolved suggesting accurate placement of the medication.  Advised to call if fevers/chills, erythema, induration, drainage, or persistent bleeding.  Images permanently stored and available for review in the ultrasound unit.  Impression: Technically successful ultrasound guided injection.  Impression and Recommendations:    Primary osteoarthritis of both elbows Repeat right elbow injection today last injection was in September 2019.   History of Lyme disease He and did have an elevated Borrelia burgdorferi IgG treated with doxycycline several years ago. Continues to have myalgias, he has a bit of Bell's palsy on the right that is residual. He also had a positive antigliadin IgG. He had partial improvement with cutting out gluten. He does continue to have anxiety, he feels "not himself" but is unable to be fully specific regarding his symptoms. At this point I really do not have much else to offer him.   Chronic migraine Did have a partial improvement with propranolol but he does desire to stop this. He will do 20 mg twice a day for a week and then stop.  Generalized anxiety manifesting as headache Continues to have uncontrolled anxiety, he does need to continue with alprazolam and BuSpar 5 twice daily.  Hyperlipidemia Rechecking routine labs today.  He is fasting.   ___________________________________________  Gwen Her. Dianah Field, M.D., ABFM., CAQSM. Primary Care and Sports Medicine [facility]  Adjunct Professor of [employer] of Medicine

## 2019-05-16 NOTE — Assessment & Plan Note (Signed)
He and did have an elevated Borrelia burgdorferi IgG treated with doxycycline several years ago. Continues to have myalgias, he has a bit of Bell's palsy on the right that is residual. He also had a positive antigliadin IgG. He had partial improvement with cutting out gluten. He does continue to have anxiety, he feels "not himself" but is unable to be fully specific regarding his symptoms. At this point I really do not have much else to offer him.

## 2019-05-16 NOTE — Assessment & Plan Note (Signed)
Did have a partial improvement with propranolol but he does desire to stop this. He will do 20 mg twice a day for a week and then stop.

## 2019-05-16 NOTE — Assessment & Plan Note (Signed)
Rechecking routine labs today.  He is fasting.

## 2019-05-16 NOTE — Assessment & Plan Note (Signed)
Repeat right elbow injection today last injection was in September 2019.

## 2019-05-17 ENCOUNTER — Encounter: Payer: Self-pay | Admitting: Sports Medicine

## 2019-05-17 DIAGNOSIS — F419 Anxiety disorder, unspecified: Secondary | ICD-10-CM

## 2019-05-17 DIAGNOSIS — F411 Generalized anxiety disorder: Secondary | ICD-10-CM

## 2019-05-17 LAB — LIPID PANEL W/REFLEX DIRECT LDL
Cholesterol: 170 mg/dL (ref <200)
HDL: 65 mg/dL (ref >=40)
LDL Cholesterol (Calc): 91 mg/dL (calc)
Non-HDL Cholesterol (Calc): 105 mg/dL (calc) (ref <130)
Total CHOL/HDL Ratio: 2.6 (calc) (ref <5.0)
Triglycerides: 62 mg/dL (ref <150)

## 2019-05-17 LAB — CBC
HCT: 48.8 % (ref 38.5–50.0)
Hemoglobin: 16.1 g/dL (ref 13.2–17.1)
MCH: 28.1 pg (ref 27.0–33.0)
MCHC: 33 g/dL (ref 32.0–36.0)
MCV: 85.3 fL (ref 80.0–100.0)
MPV: 10.4 fL (ref 7.5–12.5)
Platelets: 275 10*3/uL (ref 140–400)
RBC: 5.72 10*6/uL (ref 4.20–5.80)
RDW: 12.5 % (ref 11.0–15.0)
WBC: 5.7 10*3/uL (ref 3.8–10.8)

## 2019-05-17 LAB — HEMOGLOBIN A1C
Hgb A1c MFr Bld: 5.5 % of total Hgb (ref <5.7)
Mean Plasma Glucose: 111 (calc)
eAG (mmol/L): 6.2 (calc)

## 2019-05-17 LAB — VITAMIN D 25 HYDROXY (VIT D DEFICIENCY, FRACTURES): Vit D, 25-Hydroxy: 64 ng/mL (ref 30–100)

## 2019-05-17 LAB — COMPLETE METABOLIC PANEL WITH GFR
AG Ratio: 1.5 (calc) (ref 1.0–2.5)
ALT: 55 U/L — ABNORMAL HIGH (ref 9–46)
AST: 31 U/L (ref 10–40)
Albumin: 4.3 g/dL (ref 3.6–5.1)
Alkaline phosphatase (APISO): 45 U/L (ref 36–130)
BUN: 12 mg/dL (ref 7–25)
CO2: 28 mmol/L (ref 20–32)
Calcium: 9.9 mg/dL (ref 8.6–10.3)
Chloride: 104 mmol/L (ref 98–110)
Creat: 0.88 mg/dL (ref 0.60–1.35)
GFR, Est African American: 121 mL/min/{1.73_m2} (ref >=60)
GFR, Est Non African American: 104 mL/min/{1.73_m2} (ref >=60)
Globulin: 2.9 g/dL (calc) (ref 1.9–3.7)
Glucose, Bld: 106 mg/dL — ABNORMAL HIGH (ref 65–99)
Potassium: 5.1 mmol/L (ref 3.5–5.3)
Sodium: 140 mmol/L (ref 135–146)
Total Bilirubin: 0.6 mg/dL (ref 0.2–1.2)
Total Protein: 7.2 g/dL (ref 6.1–8.1)

## 2019-05-17 LAB — TSH: TSH: 1.32 mIU/L (ref 0.40–4.50)

## 2019-05-17 MED ORDER — PROPRANOLOL HCL 20 MG PO TABS: 20.0000 mg | ORAL_TABLET | Freq: Two times a day (BID) | ORAL | 0 refills | Status: DC

## 2019-05-17 MED ORDER — ALPRAZOLAM 0.5 MG PO TABS: ORAL_TABLET | ORAL | 0 refills | Status: DC

## 2019-05-17 MED ORDER — BUSPIRONE HCL 5 MG PO TABS: 5.0000 mg | ORAL_TABLET | Freq: Three times a day (TID) | ORAL | 3 refills | Status: DC

## 2019-06-08 ENCOUNTER — Ambulatory Visit (INDEPENDENT_AMBULATORY_CARE_PROVIDER_SITE_OTHER): Payer: BC Managed Care – PPO | Admitting: Professional

## 2019-06-08 DIAGNOSIS — F411 Generalized anxiety disorder: Secondary | ICD-10-CM

## 2019-06-16 ENCOUNTER — Other Ambulatory Visit: Payer: Self-pay

## 2019-06-16 ENCOUNTER — Encounter: Payer: Self-pay | Admitting: Sports Medicine

## 2019-06-16 ENCOUNTER — Ambulatory Visit (INDEPENDENT_AMBULATORY_CARE_PROVIDER_SITE_OTHER): Payer: BC Managed Care – PPO | Admitting: Sports Medicine

## 2019-06-16 DIAGNOSIS — M722 Plantar fascial fibromatosis: Secondary | ICD-10-CM | POA: Diagnosis not present

## 2019-06-16 DIAGNOSIS — G43709 Chronic migraine without aura, not intractable, without status migrainosus: Secondary | ICD-10-CM | POA: Diagnosis not present

## 2019-06-16 DIAGNOSIS — IMO0002 Reserved for concepts with insufficient information to code with codable children: Secondary | ICD-10-CM

## 2019-06-16 NOTE — Assessment & Plan Note (Addendum)
Initially Brian Spears desire to stop his propranolol, he stopped it, he had worsening headache so he restarted it, he is doing okay at 20 mg twice a day. He does get various episodes of paresthesias in the right side of the face, these are likely simply complex migraines.

## 2019-06-16 NOTE — Progress Notes (Signed)
Subjective:    CC: Follow-up  HPI: Migraines: He has had complex migraines with right-sided hemifacial numbness, does have a history of a normal brain MRI recently.  He has gone back on his propranolol.  Plantar fasciitis: Left-sided, worsening of pain, severe, persistent, localized at the plantar origin without radiation.  Previous injection was 3 and half months ago.  I reviewed the past medical history, family history, social history, surgical history, and allergies today and no changes were needed.  Please see the problem list section below in epic for further details.  Past Medical History: Past Medical History:  Diagnosis Date  . Hand fracture   . Headache   . Hyperlipidemia   . Lyme disease   . Normal cardiac stress test 4/10   at [facility] - negative.  terminated for fatigue   Past Surgical History: Past Surgical History:  Procedure Laterality Date  . surgery on right hand     Social History: Social History   Socioeconomic History  . Marital status: Married    Spouse name: Not on file  . Number of children: 1  . Years of education: college  . Highest education level: Not on file  Occupational History  . Occupation: Quarry manager for [employer]  . Financial resource strain: Not on file  . Food insecurity    Worry: Not on file    Inability: Not on file  . Transportation needs    Medical: Not on file    Non-medical: Not on file  Tobacco Use  . Smoking status: Former Smoker    Packs/day: 0.20    Types: Cigarettes  . Smokeless tobacco: Former Systems developer    Types: Snuff    Quit date: 06/02/2011  . Tobacco comment: started smoking at age 27, started smokeless tobacco at age 41  Substance and Sexual Activity  . Alcohol use: No  . Drug use: No  . Sexual activity: Not on file  Lifestyle  . Physical activity    Days per week: Not on file    Minutes per session: Not on file  . Stress: Not on file  Relationships  . Social Herbalist on phone: Not on  file    Gets together: Not on file    Attends religious service: Not on file    Active member of club or organization: Not on file    Attends meetings of clubs or organizations: Not on file    Relationship status: Not on file  Other Topics Concern  . Not on file  Social History Narrative   17 years of education   Married to [place] with 1 son   Mother in law lives in the home   Right-handed.   No daily caffeine use.    Family History: Family History  Problem Relation Age of Onset  . Breast cancer Mother   . Hyperlipidemia Mother   . Hypertension Father   . Hyperlipidemia Father   . Other Other        grandfather with alcoholism  . Heart attack Other        grandmother  . Diabetes Other        grandmother  . Colon cancer Other        uncle  . Breast cancer Other        aunt  . Lung cancer Other        grandmother  . Prostate cancer Other        grandfather  .  Melanoma Other        uncle   Allergies: Allergies  Allergen Reactions  . Sulfa Antibiotics Other (See Comments)    headaches  . Naproxen Sodium    Medications: See med rec.  Review of Systems: No fevers, chills, night sweats, weight loss, chest pain, or shortness of breath.   Objective:    General: Well Developed, well nourished, and in no acute distress.  Neuro: Alert and oriented x3, extra-ocular muscles intact, sensation grossly intact.  HEENT: Normocephalic, atraumatic, pupils equal round reactive to light, neck supple, no masses, no lymphadenopathy, thyroid nonpalpable.  Skin: Warm and dry, no rashes. Cardiac: Regular rate and rhythm, no murmurs rubs or gallops, no lower extremity edema.  Respiratory: Clear to auscultation bilaterally. Not using accessory muscles, speaking in full sentences. Left foot: No visible erythema or swelling. Range of motion is full in all directions. Strength is 5/5 in all directions. No hallux valgus. No pes cavus or pes planus. No abnormal callus noted. No pain  over the navicular prominence, or base of fifth metatarsal. Moderate tenderness to palpation of the calcaneal insertion of plantar fascia. No pain at the Achilles insertion. No pain over the calcaneal bursa. No pain of the retrocalcaneal bursa. No tenderness to palpation over the tarsals, metatarsals, or phalanges. No hallux rigidus or limitus. No tenderness palpation over interphalangeal joints. No pain with compression of the metatarsal heads. Neurovascularly intact distally.  Procedure: Real-time Ultrasound Guided injection of the left plantar fascial origin Device: GE Logiq E  Verbal informed consent obtained.  Time-out conducted.  Noted no overlying erythema, induration, or other signs of local infection.  Skin prepped in a sterile fashion.  Local anesthesia: Topical Ethyl chloride.  With sterile technique and under real time ultrasound guidance:  1 cc Kenalog 40, 1 cc lidocaine, 1 cc bupivacaine injected easily Completed without difficulty  Pain immediately resolved suggesting accurate placement of the medication.  Advised to call if fevers/chills, erythema, induration, drainage, or persistent bleeding.  Images permanently stored and available for review in the ultrasound unit.  Impression: Technically successful ultrasound guided injection.  Impression and Recommendations:    Plantar fasciitis, left Repeat left plantar fascia injection, previous injection was about 3 and half months ago, return as needed.  Chronic migraine Initially Uvaldo desire to stop his propranolol, he stopped it, he had worsening headache so he restarted it, he is doing okay at 20 mg twice a day. He does get various episodes of paresthesias in the right side of the face, these are likely simply complex migraines.   ___________________________________________ Gwen Her. Dianah Field, M.D., ABFM., CAQSM. Primary Care and Sports Medicine [facility]  Adjunct Professor of [employer] of Medicine

## 2019-06-16 NOTE — Assessment & Plan Note (Signed)
Repeat left plantar fascia injection, previous injection was about 3 and half months ago, return as needed.

## 2019-07-09 ENCOUNTER — Other Ambulatory Visit: Payer: Self-pay | Admitting: Sports Medicine

## 2019-07-09 DIAGNOSIS — F419 Anxiety disorder, unspecified: Secondary | ICD-10-CM

## 2019-07-09 DIAGNOSIS — F411 Generalized anxiety disorder: Secondary | ICD-10-CM

## 2019-07-11 MED ORDER — ALPRAZOLAM 0.5 MG PO TABS: ORAL_TABLET | ORAL | 0 refills | Status: DC

## 2019-07-19 ENCOUNTER — Other Ambulatory Visit: Payer: Self-pay | Admitting: Sports Medicine

## 2019-07-19 DIAGNOSIS — K219 Gastro-esophageal reflux disease without esophagitis: Secondary | ICD-10-CM

## 2019-07-19 MED ORDER — PANTOPRAZOLE SODIUM 40 MG PO TBEC: 40.0000 mg | DELAYED_RELEASE_TABLET | Freq: Every day | ORAL | 1 refills | Status: DC

## 2019-07-22 ENCOUNTER — Encounter: Payer: Self-pay | Admitting: Sports Medicine

## 2019-08-15 ENCOUNTER — Other Ambulatory Visit: Payer: Self-pay

## 2019-08-15 DIAGNOSIS — F411 Generalized anxiety disorder: Secondary | ICD-10-CM

## 2019-08-15 DIAGNOSIS — F419 Anxiety disorder, unspecified: Secondary | ICD-10-CM

## 2019-08-15 MED ORDER — ALPRAZOLAM 0.5 MG PO TABS: ORAL_TABLET | ORAL | 0 refills | Status: DC

## 2019-09-02 HISTORY — PX: COLONOSCOPY W/ POLYPECTOMY: SHX1380

## 2019-09-09 DIAGNOSIS — Z20822 Contact with and (suspected) exposure to covid-19: Secondary | ICD-10-CM | POA: Diagnosis not present

## 2019-10-05 ENCOUNTER — Other Ambulatory Visit: Payer: Self-pay

## 2019-10-05 DIAGNOSIS — F411 Generalized anxiety disorder: Secondary | ICD-10-CM

## 2019-10-05 DIAGNOSIS — F419 Anxiety disorder, unspecified: Secondary | ICD-10-CM

## 2019-10-05 MED ORDER — ALPRAZOLAM 0.5 MG PO TABS: ORAL_TABLET | ORAL | 0 refills | Status: DC

## 2019-11-07 ENCOUNTER — Other Ambulatory Visit: Payer: Self-pay

## 2019-11-07 ENCOUNTER — Ambulatory Visit: Payer: BC Managed Care – PPO | Admitting: Sports Medicine

## 2019-11-07 DIAGNOSIS — M722 Plantar fascial fibromatosis: Secondary | ICD-10-CM

## 2019-11-07 DIAGNOSIS — M19022 Primary osteoarthritis, left elbow: Secondary | ICD-10-CM | POA: Diagnosis not present

## 2019-11-07 DIAGNOSIS — Z8619 Personal history of other infectious and parasitic diseases: Secondary | ICD-10-CM | POA: Diagnosis not present

## 2019-11-07 DIAGNOSIS — M19021 Primary osteoarthritis, right elbow: Secondary | ICD-10-CM

## 2019-11-07 MED ORDER — VALACYCLOVIR HCL 1 G PO TABS: 1000.0000 mg | ORAL_TABLET | Freq: Every day | ORAL | 1 refills | Status: DC

## 2019-11-07 NOTE — Progress Notes (Signed)
    Procedures performed today:    Procedure: Real-time Ultrasound Guided injection of the right elbow joint Device: Samsung HS60  Verbal informed consent obtained.  Time-out conducted.  Noted no overlying erythema, induration, or other signs of local infection.  Skin prepped in a sterile fashion.  Local anesthesia: Topical Ethyl chloride.  With sterile technique and under real time ultrasound guidance:  Using a 25-gauge needle advanced through the anconeus muscle into the elbow joint, injected 1 cc Kenalog 40, 1 cc lidocaine, 1 cc bupivacaine.   Completed without difficulty  Pain immediately resolved suggesting accurate placement of the medication.  Advised to call if fevers/chills, erythema, induration, drainage, or persistent bleeding.  Images permanently stored and available for review in the ultrasound unit.  Impression: Technically successful ultrasound guided injection.  Procedure: Real-time Ultrasound Guided injection of the left plantar fascial origin Device: Samsung HS60  Verbal informed consent obtained.  Time-out conducted.  Noted no overlying erythema, induration, or other signs of local infection.  Skin prepped in a sterile fashion.  Local anesthesia: Topical Ethyl chloride.  With sterile technique and under real time ultrasound guidance:  25-gauge needle advanced just deep to the calcaneal origin of the plantar fascia, superficial to the quadratus plantae muscle, I then injected 1 cc Kenalog 40, 1 cc lidocaine, 1 cc bupivacaine.   Completed without difficulty  Pain immediately resolved suggesting accurate placement of the medication.  Advised to call if fevers/chills, erythema, induration, drainage, or persistent bleeding.  Images permanently stored and available for review in the ultrasound unit.  Impression: Technically successful ultrasound guided injection.  Independent interpretation of notes and tests performed by another provider:   None.  Impression and  Recommendations:    Primary osteoarthritis of both elbows And returns, he is a pleasant 44 year old male with elbow osteoarthritis, having a recurrence of pain, last injection was in September 2020. I repeated a right elbow joint injection through the anconeus muscle. Return as needed for this.  Plantar fasciitis, left Recurrence of left heel pain, history of plantar fasciitis, last injected approximately October 2020. Return to see me as needed for this.  History of Lyme disease Possible history of Lyme disease, he did have an elevated but regular burgdorferi IgG treated with doxycycline several years ago. Continues to have occasional myalgias, a bit of Bell's palsy on the right that is residual, he has been to neurology, ENT, and had advanced imaging without much improvement. I am going to add Valtrex to be taken 1000 mg daily, he will increase to twice daily for flares, we will do this for 6 months, with the understanding that this needs to be discontinued if he has not noted improvement. He is still trying to get in with complementary and alternative medicine.    ___________________________________________ Gwen Her. Dianah Field, M.D., ABFM., CAQSM. Primary Care and Economy Instructor of [employer] [facility]

## 2019-11-07 NOTE — Assessment & Plan Note (Signed)
Possible history of Lyme disease, he did have an elevated but regular burgdorferi IgG treated with doxycycline several years ago. Continues to have occasional myalgias, a bit of Bell's palsy on the right that is residual, he has been to neurology, ENT, and had advanced imaging without much improvement. I am going to add Valtrex to be taken 1000 mg daily, he will increase to twice daily for flares, we will do this for 6 months, with the understanding that this needs to be discontinued if he has not noted improvement. He is still trying to get in with complementary and alternative medicine.

## 2019-11-07 NOTE — Assessment & Plan Note (Signed)
Recurrence of left heel pain, history of plantar fasciitis, last injected approximately October 2020. Return to see me as needed for this.

## 2019-11-07 NOTE — Assessment & Plan Note (Signed)
And returns, he is a pleasant 44 year old male with elbow osteoarthritis, having a recurrence of pain, last injection was in September 2020. I repeated a right elbow joint injection through the anconeus muscle. Return as needed for this.

## 2019-11-16 ENCOUNTER — Other Ambulatory Visit: Payer: Self-pay

## 2019-11-16 DIAGNOSIS — F411 Generalized anxiety disorder: Secondary | ICD-10-CM

## 2019-11-16 DIAGNOSIS — F419 Anxiety disorder, unspecified: Secondary | ICD-10-CM

## 2019-11-17 MED ORDER — ALPRAZOLAM 0.5 MG PO TABS: ORAL_TABLET | ORAL | 0 refills | Status: DC

## 2019-11-30 DIAGNOSIS — L821 Other seborrheic keratosis: Secondary | ICD-10-CM | POA: Diagnosis not present

## 2019-11-30 DIAGNOSIS — L301 Dyshidrosis [pompholyx]: Secondary | ICD-10-CM | POA: Diagnosis not present

## 2019-11-30 DIAGNOSIS — L578 Other skin changes due to chronic exposure to nonionizing radiation: Secondary | ICD-10-CM | POA: Diagnosis not present

## 2019-12-04 ENCOUNTER — Other Ambulatory Visit: Payer: Self-pay | Admitting: Sports Medicine

## 2019-12-04 DIAGNOSIS — F411 Generalized anxiety disorder: Secondary | ICD-10-CM

## 2019-12-24 ENCOUNTER — Other Ambulatory Visit: Payer: Self-pay

## 2019-12-24 DIAGNOSIS — F419 Anxiety disorder, unspecified: Secondary | ICD-10-CM

## 2019-12-24 DIAGNOSIS — F411 Generalized anxiety disorder: Secondary | ICD-10-CM

## 2019-12-26 MED ORDER — ALPRAZOLAM 0.5 MG PO TABS: ORAL_TABLET | ORAL | 0 refills | Status: DC

## 2019-12-29 ENCOUNTER — Telehealth: Payer: Self-pay | Admitting: *Deleted

## 2019-12-29 NOTE — Telephone Encounter (Signed)
Patient called to get appointment.  States he has been having some issues recently with night sweats, left facial twitching, right facial numbness and some right foot dragging.  Appt scheduled.

## 2019-12-30 IMAGING — MR MRI HEAD WITHOUT AND WITH CONTRAST
12 series · 48 of 48 positions shown · IV contrast (gadavist)
Comparison: None.
COMPARISON: None.

Addendum:
CLINICAL DATA: Headache for 4 weeks. Pain and pressure in the right
ear. Right-sided neck tension. Right eye blurry vision.

EXAM:
MRI HEAD WITHOUT AND WITH CONTRAST
TECHNIQUE: Multiplanar, multiecho pulse sequences of the brain and surrounding
structures were obtained without and with intravenous contrast.
CONTRAST:  8 mL Gadavist

[Series 2: DWI · axial · 3.0mm · 1.20mm/px · z∈[-61,+101]mm · 8 of 108 slices shown (1 of 4)]
[im 1/108]
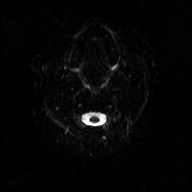
[im 16/108]
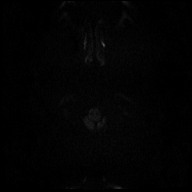
[im 31/108]
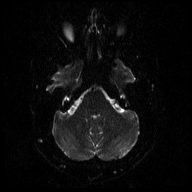
[im 46/108]
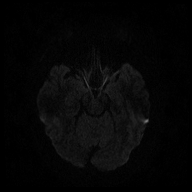
[im 62/108]
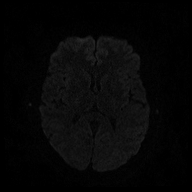
[im 77/108]
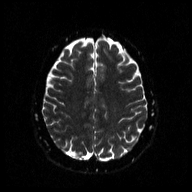
[im 92/108]
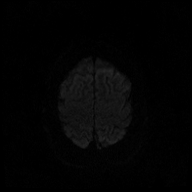
[im 108/108]
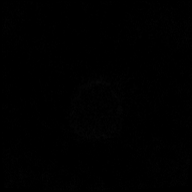

[Series 5: DWI · coronal · 3.0mm · 1.15mm/px · 4 of 49 slices shown (2 of 4)]
[im 1/49]
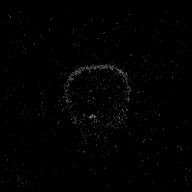
[im 17/49]
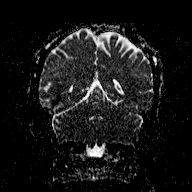
[im 33/49]
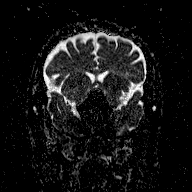
[im 49/49]
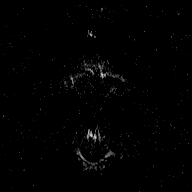

[Series 6: T1 · sagittal · 5.0mm · 0.45mm/px · 1 of 23 slices shown (1 of 2)]
[im 1/23]
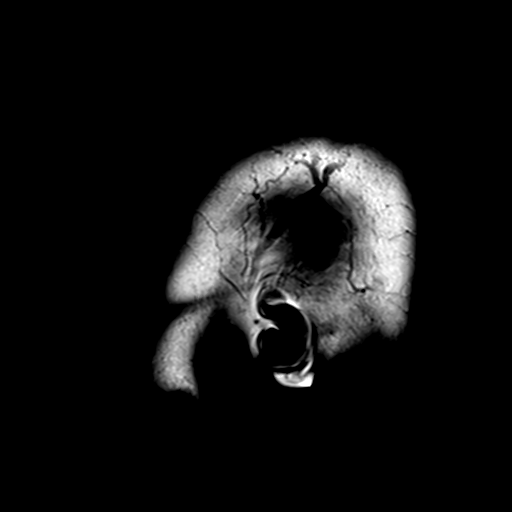

[Series 7: T2 · axial · 5.0mm · 0.72mm/px · 1 of 23 slices shown (1 of 2)]
[im 1/23]
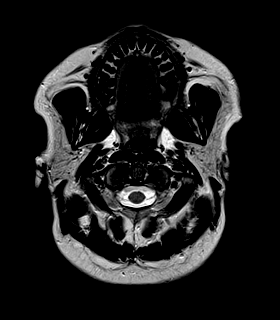

[Series 8: FLAIR · axial · 3.0mm · 0.45mm/px · z∈[-62,+100]mm · 3 of 55 slices shown]
[im 1/55]
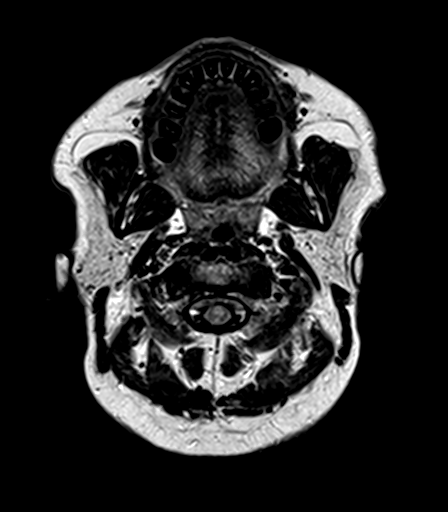
[im 28/55]
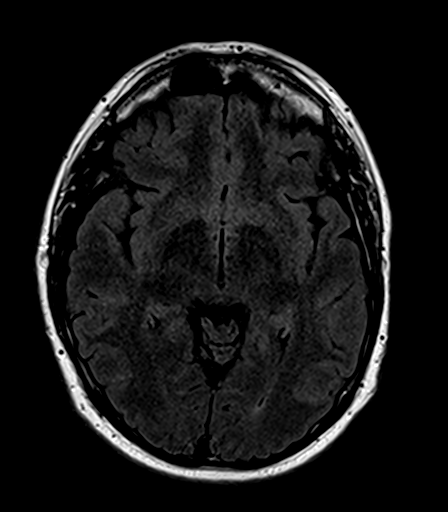
[im 55/55]
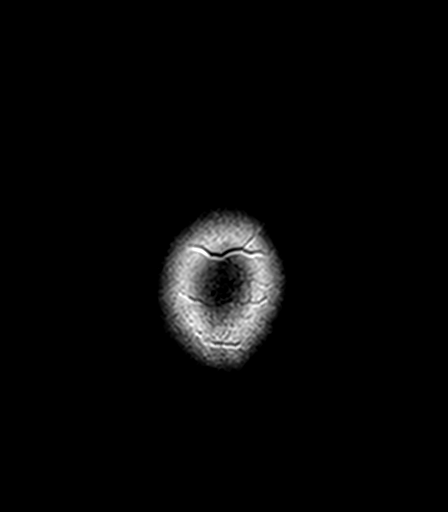

[Series 9: T2 · axial · 5.0mm · 0.72mm/px · 1 of 23 slices shown (2 of 2)]
[im 1/23]
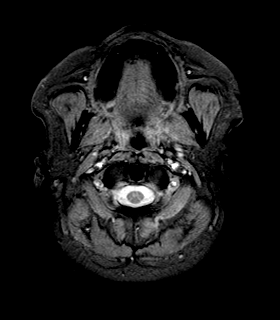

[Series 10: T1 · axial · 1.0mm · 1.00mm/px · z∈[-60,+99]mm · 10 of 160 slices shown (2 of 2)]
[im 1/160]
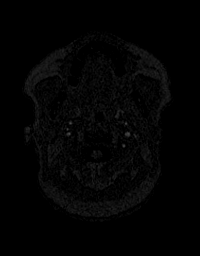
[im 18/160]
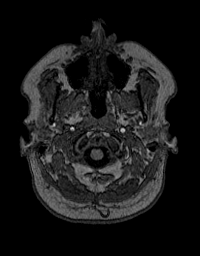
[im 36/160]
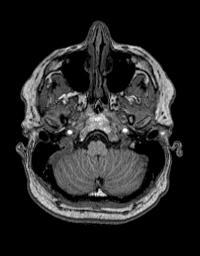
[im 54/160]
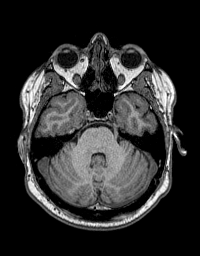
[im 71/160]
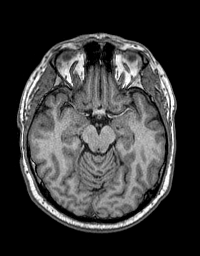
[im 89/160]
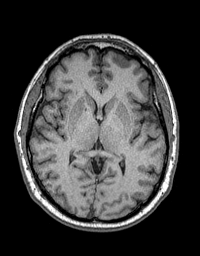
[im 107/160]
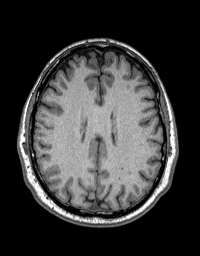
[im 124/160]
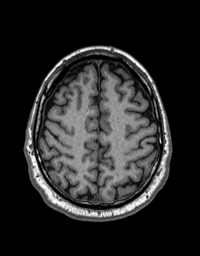
[im 142/160]
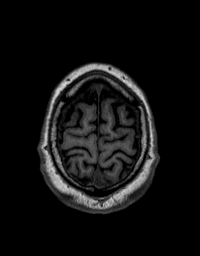
[im 160/160]
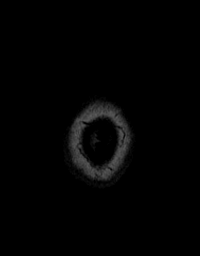

[Series 11: T2 post-contrast · coronal · 5.0mm · 0.43mm/px · 2 of 31 slices shown]
[im 1/31]
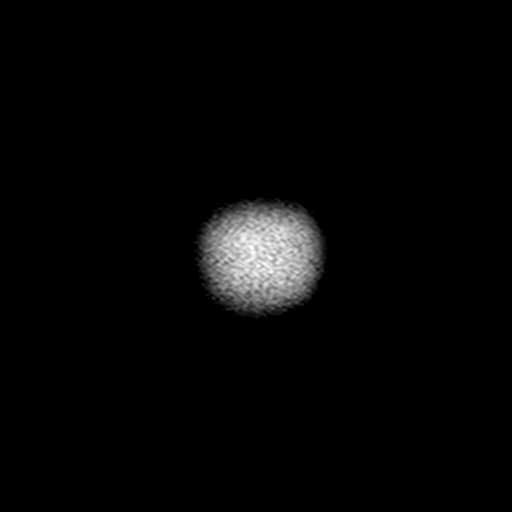
[im 31/31]
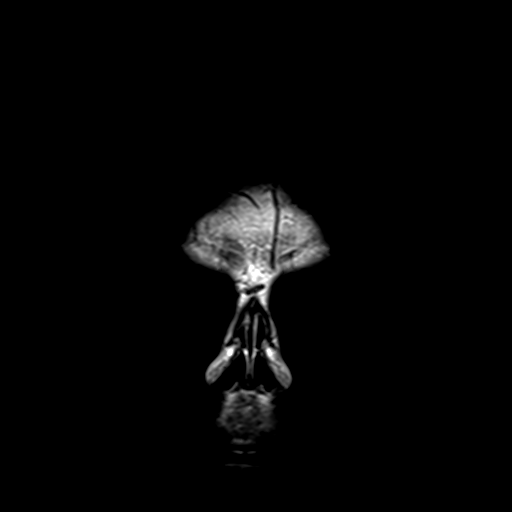

[Series 12: T1 post-contrast · axial · 1.0mm · 1.00mm/px · z∈[-60,+99]mm · 10 of 160 slices shown (1 of 2)]
[im 1/160]
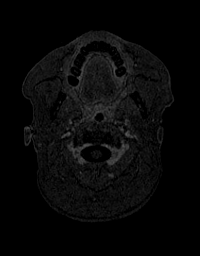
[im 18/160]
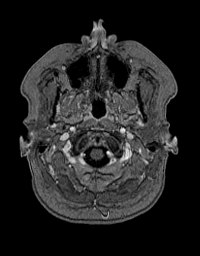
[im 36/160]
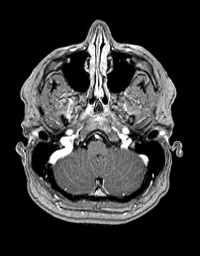
[im 54/160]
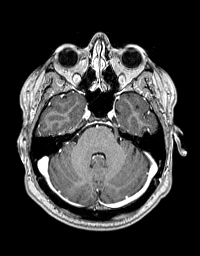
[im 71/160]
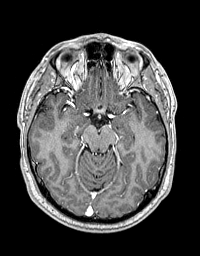
[im 89/160]
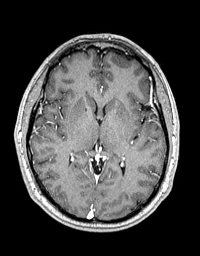
[im 107/160]
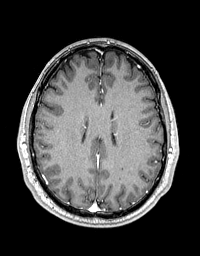
[im 124/160]
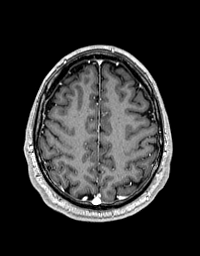
[im 142/160]
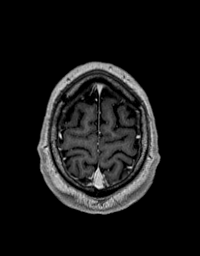
[im 160/160]
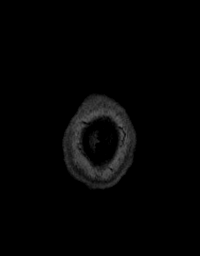

[Series 13: T1 post-contrast · coronal · 5.0mm · 0.43mm/px · 2 of 31 slices shown (2 of 2)]
[im 1/31]
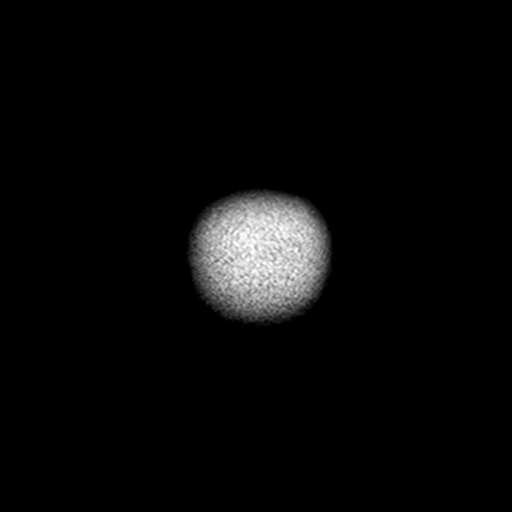
[im 31/31]
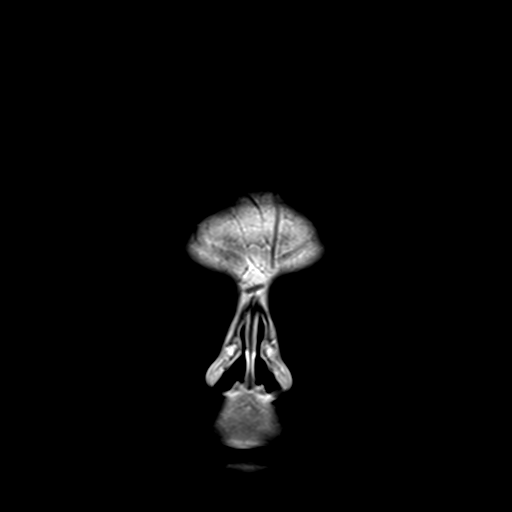

[Series 100: DWI · axial · 3.0mm · 1.20mm/px · z∈[-61,+101]mm · 3 of 55 slices shown (3 of 4)]
[im 1/55]
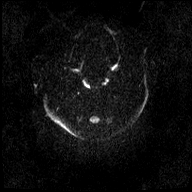
[im 28/55]
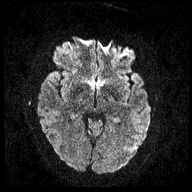
[im 55/55]
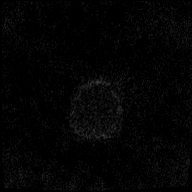

[Series 101: DWI · coronal · 3.0mm · 1.15mm/px · 3 of 49 slices shown (4 of 4)]
[im 1/49]
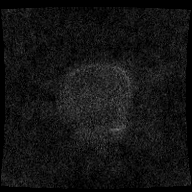
[im 25/49]
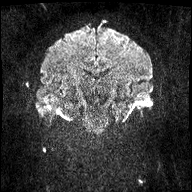
[im 49/49]
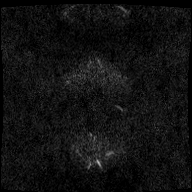

[48 of 48 positions shown; findings below may reference images not displayed]

FINDINGS: Brain: There is no evidence of acute infarct, intracranial
hemorrhage, mass, midline shift, or extra-axial fluid collection.
The ventricles and sulci are normal. The brain is normal in signal.
No abnormal enhancement is identified.

Vascular: Major intracranial vascular flow voids are preserved.

Skull and upper cervical spine: Unremarkable bone marrow signal.

Sinuses/Orbits: Unremarkable orbits. Paranasal sinuses and mastoid
air cells are clear.

Other: None.
IMPRESSION: Negative brain MRI.

ADDENDUM:
There is a single punctate focus of susceptibility artifact in the
subcortical white matter of the anterior right frontal lobe possibly
reflecting a chronic microhemorrhage though nonspecific in
isolation.

*** End of Addendum ***
FINDINGS: Brain: There is no evidence of acute infarct, intracranial
hemorrhage, mass, midline shift, or extra-axial fluid collection.
The ventricles and sulci are normal. The brain is normal in signal.
No abnormal enhancement is identified.

Vascular: Major intracranial vascular flow voids are preserved.

Skull and upper cervical spine: Unremarkable bone marrow signal.

Sinuses/Orbits: Unremarkable orbits. Paranasal sinuses and mastoid
air cells are clear.

Other: None.
IMPRESSION: Negative brain MRI.

## 2020-01-03 ENCOUNTER — Inpatient Hospital Stay: Payer: BC Managed Care – PPO | Admitting: Internal Medicine

## 2020-01-24 ENCOUNTER — Other Ambulatory Visit: Payer: Self-pay

## 2020-01-24 ENCOUNTER — Inpatient Hospital Stay: Payer: BC Managed Care – PPO | Attending: Internal Medicine | Admitting: Internal Medicine

## 2020-01-24 VITALS — BP 125/82 | HR 73 | Temp 97.7°F | Resp 18 | Ht 68.0 in | Wt 179.5 lb

## 2020-01-24 DIAGNOSIS — Z8616 Personal history of COVID-19: Secondary | ICD-10-CM | POA: Diagnosis not present

## 2020-01-24 DIAGNOSIS — Z8042 Family history of malignant neoplasm of prostate: Secondary | ICD-10-CM | POA: Insufficient documentation

## 2020-01-24 DIAGNOSIS — R51 Headache with orthostatic component, not elsewhere classified: Secondary | ICD-10-CM | POA: Diagnosis not present

## 2020-01-24 DIAGNOSIS — IMO0002 Reserved for concepts with insufficient information to code with codable children: Secondary | ICD-10-CM

## 2020-01-24 DIAGNOSIS — G43709 Chronic migraine without aura, not intractable, without status migrainosus: Secondary | ICD-10-CM

## 2020-01-24 DIAGNOSIS — Z800 Family history of malignant neoplasm of digestive organs: Secondary | ICD-10-CM | POA: Insufficient documentation

## 2020-01-24 DIAGNOSIS — Z87891 Personal history of nicotine dependence: Secondary | ICD-10-CM | POA: Diagnosis not present

## 2020-01-24 DIAGNOSIS — Z79899 Other long term (current) drug therapy: Secondary | ICD-10-CM | POA: Diagnosis not present

## 2020-01-24 DIAGNOSIS — Z801 Family history of malignant neoplasm of trachea, bronchus and lung: Secondary | ICD-10-CM | POA: Insufficient documentation

## 2020-01-24 DIAGNOSIS — Z803 Family history of malignant neoplasm of breast: Secondary | ICD-10-CM | POA: Insufficient documentation

## 2020-01-24 DIAGNOSIS — F419 Anxiety disorder, unspecified: Secondary | ICD-10-CM | POA: Insufficient documentation

## 2020-01-24 NOTE — Progress Notes (Signed)
Brian Spears, Brian Spears [phone]   Interval Evaluation  Date of Service: 01/24/20 Patient Name: Brian Spears Patient MRN: [id number] Patient DOB: 1975-10-12 Provider: Ventura Sellers, MD  Identifying Statement:  Brian Spears is a 44 y.o. male with headaches  Referring Provider: Silverio Decamp, [place],   [id number]  Interval History:  Brian Spears presents today for clinical follow up given new symptoms.  He describes episode of lower right facial numbness which resolved on its own after several minutes.  He also describes several times "tripping over big toe on the right" although no weakness and he has not fallen.  Continues to have aching and cramping discomfort in his neck and upper chest.  Headaches have persisted but are much less of a problem compared to last summer.  He is no longer taking the propanolol.  Did have COVID syndrome (per patient) recently now recovered. Continues to manage anxiety symptoms with buspar and xanax as prior.  H+P (04/14/19) Patient presents today to review recent headache syndrome, as well as evaluation of recent MRI brain.  He describes a syndrome of 8/10 pressure-like pain, both in middle of forehead and back of head without radiation.  These headaches occur spontaneously, may last for hours or an entire day.  There are no migrainous features such as photophobia, nausea/vomiting. They occur on a daily basis for the past 2-3 months.  Preceding onset of headache syndrome, he had a flu-like illness respiratory infection.  He does not have personal history of headaches or any migraines in the family.  For pain, he has been dosing ibuprofen daily for the past 3 months.  Medications: Current Outpatient Medications on File Prior to Visit  Medication Sig Dispense Refill  . ALPRAZolam (XANAX) 0.5 MG tablet One half tab in the morning, one half  tab midday, 1 tab p.o. nightly as needed 60 tablet 0  . atorvastatin (LIPITOR) 10 MG tablet TAKE 1 TABLET BY MOUTH EVERY DAY 90 tablet 3  . busPIRone (BUSPAR) 5 MG tablet TAKE 1 TABLET BY MOUTH THREE TIMES A DAY 270 tablet 2  . OMEGA-3 FATTY ACIDS PO Take by mouth.    . pantoprazole (PROTONIX) 40 MG tablet Take 1 tablet (40 mg total) by mouth daily. 90 tablet 1  . propranolol (INDERAL) 20 MG tablet Take 1 tablet (20 mg total) by mouth 2 (two) times daily. (Patient taking differently: Take 10 mg by mouth 2 (two) times daily. ) 14 tablet 0  . rizatriptan (MAXALT) 10 MG tablet Take 1 tablet (10 mg total) by mouth as needed for migraine. May repeat in 2 hours if needed 10 tablet 3  . triamcinolone cream (KENALOG) 0.5 % Apply 1 application topically 2 (two) times daily. To affected areas. 30 g 3  . valACYclovir (VALTREX) 1000 MG tablet Take 1 tablet (1,000 mg total) by mouth daily. 90 tablet 1   No current facility-administered medications on file prior to visit.    Allergies:  Allergies  Allergen Reactions  . Sulfa Antibiotics Other (See Comments)    headaches  . Naproxen Sodium    Past Medical History:  Past Medical History:  Diagnosis Date  . Hand fracture   . Headache   . Hyperlipidemia   . Lyme disease   . Normal cardiac stress test 4/10   at [facility] - negative.  terminated for fatigue   Past Surgical  History:  Past Surgical History:  Procedure Laterality Date  . surgery on right hand     Social History:  Social History   Socioeconomic History  . Marital status: Married    Spouse name: Not on file  . Number of children: 1  . Years of education: college  . Highest education level: Not on file  Occupational History  . Occupation: Quarry manager for [employer]  . Smoking status: Former Smoker    Packs/day: 0.20    Types: Cigarettes  . Smokeless tobacco: Former Systems developer    Types: Snuff    Quit date: 06/02/2011  . Tobacco comment: started smoking at age 10, started  smokeless tobacco at age 44  Substance and Sexual Activity  . Alcohol use: No  . Drug use: No  . Sexual activity: Not on file  Other Topics Concern  . Not on file  Social History Narrative   17 years of education   Married to [place] with 1 son   Mother in law lives in the home   Right-handed.   No daily caffeine use.    Social Determinants of Health   Financial Resource Strain:   . Difficulty of Paying Living Expenses:   Food Insecurity:   . Worried About Charity fundraiser in the Last Year:   . Arboriculturist in the Last Year:   Transportation Needs:   . Film/video editor (Medical):   Marland Kitchen Lack of Transportation (Non-Medical):   Physical Activity:   . Days of Exercise per Week:   . Minutes of Exercise per Session:   Stress:   . Feeling of Stress :   Social Connections:   . Frequency of Communication with Friends and Family:   . Frequency of Social Gatherings with Friends and Family:   . Attends Religious Services:   . Active Member of Clubs or Organizations:   . Attends Archivist Meetings:   Marland Kitchen Marital Status:   Intimate Partner Violence:   . Fear of Current or Ex-Partner:   . Emotionally Abused:   Marland Kitchen Physically Abused:   . Sexually Abused:    Family History:  Family History  Problem Relation Age of Onset  . Breast cancer Mother   . Hyperlipidemia Mother   . Hypertension Father   . Hyperlipidemia Father   . Other Other        grandfather with alcoholism  . Heart attack Other        grandmother  . Diabetes Other        grandmother  . Colon cancer Other        uncle  . Breast cancer Other        aunt  . Lung cancer Other        grandmother  . Prostate cancer Other        grandfather  . Melanoma Other        uncle    Review of Systems: Constitutional: Denies fevers, chills or abnormal weight loss Eyes: Denies blurriness of vision Ears, nose, mouth, throat, and face: Denies mucositis or sore throat Respiratory: Denies cough, dyspnea or  wheezes Cardiovascular: Denies palpitation, chest discomfort or lower extremity swelling Gastrointestinal:  Denies nausea, constipation, diarrhea GU: Denies dysuria or incontinence Skin: Denies abnormal skin rashes Neurological: Per HPI Musculoskeletal: Denies joint pain, back or neck discomfort. No decrease in ROM Behavioral/Psych: Denies anxiety, disturbance in thought content, and mood instability  Physical Exam: Vitals:   01/24/20 1246  BP: 125/82  Pulse: 73  Resp: 18  Temp: 97.7 F (36.5 C)  SpO2: 99%   KPS: 100. General: Alert, cooperative, pleasant, in no acute distress Head: Normal EENT: No conjunctival injection or scleral icterus. Oral mucosa moist Lungs: Resp effort normal Cardiac: Regular rate and rhythm Abdomen: Soft, non-distended abdomen Skin: No rashes cyanosis or petechiae. Extremities: No clubbing or edema  Neurologic Exam: Mental Status: Awake, alert, attentive to examiner. Oriented to self and environment. Language is fluent with intact comprehension.  Cranial Nerves: Visual acuity is grossly normal. Visual fields are full. Extra-ocular movements intact. No ptosis. Face is symmetric, tongue midline. Motor: Tone and bulk are normal. Power is full in both arms and legs. Sensory: Intact to light touch Gait: Normal   Labs: I have reviewed the data as listed    Component Value Date/Time   NA 140 05/16/2019 0925   K 5.1 05/16/2019 0925   CL 104 05/16/2019 0925   CO2 28 05/16/2019 0925   GLUCOSE 106 (H) 05/16/2019 0925   BUN 12 05/16/2019 0925   CREATININE 0.88 05/16/2019 0925   CALCIUM 9.9 05/16/2019 0925   PROT 7.2 05/16/2019 0925   ALBUMIN 4.1 02/05/2017 0932   ALBUMIN 4.1 02/05/2017 0932   AST 31 05/16/2019 0925   ALT 55 (H) 05/16/2019 0925   ALKPHOS 45 02/05/2017 0932   BILITOT 0.6 05/16/2019 0925   GFRNONAA 104 05/16/2019 0925   GFRAA 121 05/16/2019 0925   Lab Results  Component Value Date   WBC 5.7 05/16/2019   NEUTROABS 2,053  11/16/2017   HGB 16.1 05/16/2019   HCT 48.8 05/16/2019   MCV 85.3 05/16/2019   PLT 275 05/16/2019    Assessment/Plan Chronic Daily Headache   Brian Spears presents with clinical syndrome today which is not clearly localizable.  Fortunately his headaches are improved without prophylaxis.  He has no clear motor deficits to correlate with subjective feeling of tripping on right foot.  Transient facial syndrome does not have clear correlation, CNS syndrome such as TIA would involve the entire hemiface, not lower half only.  We provided counseling today with regards to management of anxiety and how psychological factors can aggravate somatic features of various pathologies.  It is unclear if his syndrome of arthralgias, bells palsy, headaches is related to post-lyme syndrome.  His inflammatory workup and MRI were unremarkable last year.  All questions were answered. We are happy to follow up with him as needed, or he may continue to follow with his neurologist as scheduled. The patient knows to call the clinic with any problems, questions or concerns. No barriers to learning were detected.  The total time spent in the encounter was 45 minutes and more than 50% was on counseling and review of test results   Ventura Sellers, MD Medical Director of Neuro-Oncology [facility] [place] 01/24/20 12:46 PM

## 2020-02-02 ENCOUNTER — Encounter: Payer: Self-pay | Admitting: Gastroenterology

## 2020-02-07 ENCOUNTER — Other Ambulatory Visit: Payer: Self-pay

## 2020-02-07 DIAGNOSIS — F419 Anxiety disorder, unspecified: Secondary | ICD-10-CM

## 2020-02-07 DIAGNOSIS — F411 Generalized anxiety disorder: Secondary | ICD-10-CM

## 2020-02-07 MED ORDER — ALPRAZOLAM 0.5 MG PO TABS: ORAL_TABLET | ORAL | 0 refills | Status: DC

## 2020-02-24 ENCOUNTER — Encounter: Payer: Self-pay | Admitting: Gastroenterology

## 2020-02-24 ENCOUNTER — Ambulatory Visit: Payer: BC Managed Care – PPO | Admitting: Gastroenterology

## 2020-02-24 VITALS — BP 110/80 | HR 72 | Temp 97.1°F | Ht 68.0 in | Wt 179.1 lb

## 2020-02-24 DIAGNOSIS — R7401 Elevation of levels of liver transaminase levels: Secondary | ICD-10-CM

## 2020-02-24 DIAGNOSIS — Z01818 Encounter for other preprocedural examination: Secondary | ICD-10-CM

## 2020-02-24 DIAGNOSIS — R1013 Epigastric pain: Secondary | ICD-10-CM | POA: Diagnosis not present

## 2020-02-24 DIAGNOSIS — Z1211 Encounter for screening for malignant neoplasm of colon: Secondary | ICD-10-CM | POA: Diagnosis not present

## 2020-02-24 DIAGNOSIS — R1011 Right upper quadrant pain: Secondary | ICD-10-CM | POA: Diagnosis not present

## 2020-02-24 DIAGNOSIS — R1084 Generalized abdominal pain: Secondary | ICD-10-CM

## 2020-02-24 MED ORDER — CLENPIQ 10-3.5-12 MG-GM -GM/160ML PO SOLN
1.0000 | Freq: Once | ORAL | 0 refills | Status: AC
Start: 2020-02-24 — End: 2020-02-24

## 2020-02-24 NOTE — Patient Instructions (Signed)
You have been scheduled for an endoscopy and colonoscopy. Please follow the written instructions given to you at your visit today. Please pick up your prep supplies at the pharmacy within the next 1-3 days. If you use inhalers (even only as needed), please bring them with you on the day of your procedure. Your physician has requested that you go to www.startemmi.com and enter the access code given to you at your visit today. This web site gives a general overview about your procedure. However, you should still follow specific instructions given to you by our office regarding your preparation for the procedure.  Stop Protonix  We have given you samples of the following medication to take: FD Guard  It was a pleasure to see you today!  Vito Cirigliano, D.O.

## 2020-02-24 NOTE — Progress Notes (Signed)
Chief Complaint: Abdominal pain   Referring Provider:     Aundria Mems J,MD   HPI:     Brian Spears is a 44 y.o. male with a history of anxiety, headaches, hyperlipidemia, referred to the [facility]ology [facility] for evaluation of abdominal pain.  Describes 1 month history of generalized abdominal pain, possibly worse in the RUQ and epigastrium.  Thinks symptoms started after weaning off of Valtrex.  Has had intermittent GI symptoms for multiple years.  Separately, history of rectal irritation which has since resolved.  Took Asacol in his teens for "colitis" for a few years.  He reports a hx of Celiac and has employed GFD for many years.  This is based on a elevated gliadin IgG in 2015, with otherwise normal TTG IgA and gliadin IgA at that time.  No repeat or additional studies noted in EMR.  No associated changes in bowel habits, nausea/vomiting, hematochezia, melena.  Taking Protonix 40 mg for history of reflux.  -05/2019: ALT 55, otherwise normal CMP and CBC -01/2019: Normal CMP, CBC -12/2017: H. pylori negative -05/2017: ANA negative -10/2014: Abdominal ultrasound: Normal -05/2014: Elevated gliadin IgG with normal gliadin IgA, tTG IgA -07/2013: Negative H. pylori, normal lipase, CMP, CBC -10/2001: Normal small bowel imaging   Past Medical History:  Diagnosis Date  . Anxiety   . Arthritis   . Hand fracture   . Headache   . Hyperlipidemia   . Lyme disease   . Normal cardiac stress test 4/10   at [facility] - negative.  terminated for fatigue     Past Surgical History:  Procedure Laterality Date  . surgery on right hand     Family History  Problem Relation Age of Onset  . Breast cancer Mother   . Hyperlipidemia Mother   . Hypertension Father   . Hyperlipidemia Father   . Other Other        grandfather with alcoholism  . Heart attack Other        grandmother  . Diabetes Other        grandmother  . Colon cancer Other        great uncles on  Dads side  . Breast cancer Other        aunt  . Lung cancer Other        grandmother  . Prostate cancer Other        grandfather  . Melanoma Other        uncle   Social History   Tobacco Use  . Smoking status: Former Smoker    Packs/day: 0.20    Types: Cigarettes  . Smokeless tobacco: Never Used  . Tobacco comment: started smoking at age 23, started smokeless tobacco at age 66  Substance Use Topics  . Alcohol use: No  . Drug use: No   Current Outpatient Medications  Medication Sig Dispense Refill  . ALPRAZolam (XANAX) 0.5 MG tablet One half tab in the morning, one half tab midday, 1 tab p.o. nightly as needed (Patient taking differently: 0.5 mg daily. Takes 1 quarter tablet once daily.) 60 tablet 0  . atorvastatin (LIPITOR) 10 MG tablet TAKE 1 TABLET BY MOUTH EVERY DAY 90 tablet 3  . busPIRone (BUSPAR) 5 MG tablet TAKE 1 TABLET BY MOUTH THREE TIMES A DAY (Patient taking differently: 5 mg 2 (two) times daily. ) 270 tablet 2  . Cyanocobalamin (VITAMIN B 12 PO) Take by mouth.    Marland Kitchen  Multiple Vitamins-Minerals (ZINC PO) Take by mouth daily.    . OMEGA-3 FATTY ACIDS PO Take by mouth.    . pantoprazole (PROTONIX) 40 MG tablet Take 1 tablet (40 mg total) by mouth daily. 90 tablet 1  . propranolol (INDERAL) 20 MG tablet Take 1 tablet (20 mg total) by mouth 2 (two) times daily. (Patient taking differently: Take 10 mg by mouth 2 (two) times daily. ) 14 tablet 0  . rizatriptan (MAXALT) 10 MG tablet Take 1 tablet (10 mg total) by mouth as needed for migraine. May repeat in 2 hours if needed 10 tablet 3  . triamcinolone cream (KENALOG) 0.5 % Apply 1 application topically 2 (two) times daily. To affected areas. (Patient taking differently: Apply 1 application topically 2 (two) times daily as needed. To affected areas.) 30 g 3   No current facility-administered medications for this visit.   Allergies  Allergen Reactions  . Sulfa Antibiotics Other (See Comments)    headaches  . Acetaminophen     . Naproxen Sodium      Review of Systems: All systems reviewed and negative except where noted in HPI.     Physical Exam:    Wt Readings from Last 3 Encounters:  02/24/20 179 lb 2 oz (81.3 kg)  01/24/20 179 lb 8 oz (81.4 kg)  06/16/19 197 lb (89.4 kg)    Temp (!) 97.1 F (36.2 C)   Ht 5' 8"  (1.727 m)   Wt 179 lb 2 oz (81.3 kg)   BMI 27.24 kg/m  Constitutional:  Pleasant, in no acute distress. Psychiatric: Normal mood and affect. Behavior is normal. EENT: Pupils normal.  Conjunctivae are normal. No scleral icterus. Neck supple. No cervical LAD. Cardiovascular: Normal rate, regular rhythm. No edema Pulmonary/chest: Effort normal and breath sounds normal. No wheezing, rales or rhonchi. Abdominal: Mild TTP throughout abdomen without rebound or guarding.  No peritoneal signs.  Soft, nondistended. Bowel sounds active throughout. There are no masses palpable. No hepatomegaly. Neurological: Alert and oriented to person place and time. Skin: Skin is warm and dry. No rashes noted. Rectal: Exam deferred by patient to time of colonoscopy.    ASSESSMENT AND PLAN;   1) Generalized abdominal pain 1 month history of generalized abdominal pain, worse in the upper abdomen, superimposed on a longstanding history of intermittent GI symptoms.  Unclear if he truly has Celiac based on prior serologic testing, but has employed GFD for many years. -EGD with gastric and duodenal biopsies -If EGD unrevealing and symptoms continue, could consider cross-sectional imaging -Could consider HLA DQ 2/DQ 8 versus trial off gluten-free diet with repeat serologic studies to establish whether or not he truly has Celiac Disease  2) CRC screening: -Per newest guidelines, due for routine, average risk CRC screening -Schedule colonoscopy  3) Elevated ALT -Mildly elevated ALT of 55 in 05/2019.  Otherwise normal CMP no prior history of elevated enzymes -Repeat LFTs with next set of routine labs   The  indications, risks, and benefits of EGD and colonoscopy were explained to the patient in detail. Risks include but are not limited to bleeding, perforation, adverse reaction to medications, and cardiopulmonary compromise. Sequelae include but are not limited to the possibility of surgery, hositalization, and mortality. The patient verbalized understanding and wished to proceed. All questions answered, referred to scheduler and bowel prep ordered. Further recommendations pending results of the exam.     SOTERO BRINKMEYER, DO, FACG  02/24/2020, 2:31 PM   Silverio Decamp,*

## 2020-02-28 ENCOUNTER — Ambulatory Visit (INDEPENDENT_AMBULATORY_CARE_PROVIDER_SITE_OTHER): Payer: BC Managed Care – PPO | Admitting: Medical-Surgical

## 2020-02-28 ENCOUNTER — Ambulatory Visit (INDEPENDENT_AMBULATORY_CARE_PROVIDER_SITE_OTHER): Payer: BC Managed Care – PPO

## 2020-02-28 ENCOUNTER — Other Ambulatory Visit: Payer: Self-pay

## 2020-02-28 ENCOUNTER — Encounter: Payer: Self-pay | Admitting: Medical-Surgical

## 2020-02-28 VITALS — BP 111/68 | HR 62 | Temp 98.2°F | Ht 68.0 in | Wt 175.3 lb

## 2020-02-28 DIAGNOSIS — R1084 Generalized abdominal pain: Secondary | ICD-10-CM

## 2020-02-28 DIAGNOSIS — R1011 Right upper quadrant pain: Secondary | ICD-10-CM

## 2020-02-28 DIAGNOSIS — R109 Unspecified abdominal pain: Secondary | ICD-10-CM | POA: Diagnosis not present

## 2020-02-28 LAB — CBC WITH DIFFERENTIAL/PLATELET
Absolute Monocytes: 594 cells/uL (ref 200–950)
Basophils Absolute: 53 cells/uL (ref 0–200)
Basophils Relative: 0.8 %
Eosinophils Absolute: 350 cells/uL (ref 15–500)
Eosinophils Relative: 5.3 %
HCT: 49.5 % (ref 38.5–50.0)
Hemoglobin: 16.5 g/dL (ref 13.2–17.1)
Lymphs Abs: 2402 cells/uL (ref 850–3900)
MCH: 29.2 pg (ref 27.0–33.0)
MCHC: 33.3 g/dL (ref 32.0–36.0)
MCV: 87.5 fL (ref 80.0–100.0)
MPV: 10.4 fL (ref 7.5–12.5)
Monocytes Relative: 9 %
Neutro Abs: 3201 cells/uL (ref 1500–7800)
Neutrophils Relative %: 48.5 %
Platelets: 267 10*3/uL (ref 140–400)
RBC: 5.66 10*6/uL (ref 4.20–5.80)
RDW: 11.8 % (ref 11.0–15.0)
Total Lymphocyte: 36.4 %
WBC: 6.6 10*3/uL (ref 3.8–10.8)

## 2020-02-28 LAB — IRON,TIBC AND FERRITIN PANEL
%SAT: 30 % (calc) (ref 20–48)
Ferritin: 166 ng/mL (ref 38–380)
Iron: 113 ug/dL (ref 50–180)
TIBC: 381 mcg/dL (calc) (ref 250–425)

## 2020-02-28 LAB — COMPLETE METABOLIC PANEL WITH GFR
AG Ratio: 1.6 (calc) (ref 1.0–2.5)
ALT: 30 U/L (ref 9–46)
AST: 21 U/L (ref 10–40)
Albumin: 4.8 g/dL (ref 3.6–5.1)
Alkaline phosphatase (APISO): 61 U/L (ref 36–130)
BUN: 16 mg/dL (ref 7–25)
CO2: 30 mmol/L (ref 20–32)
Calcium: 10.2 mg/dL (ref 8.6–10.3)
Chloride: 102 mmol/L (ref 98–110)
Creat: 0.79 mg/dL (ref 0.60–1.35)
GFR, Est African American: 126 mL/min/{1.73_m2} (ref >=60)
GFR, Est Non African American: 108 mL/min/{1.73_m2} (ref >=60)
Globulin: 3 g/dL (calc) (ref 1.9–3.7)
Glucose, Bld: 97 mg/dL (ref 65–99)
Potassium: 5 mmol/L (ref 3.5–5.3)
Sodium: 138 mmol/L (ref 135–146)
Total Bilirubin: 0.9 mg/dL (ref 0.2–1.2)
Total Protein: 7.8 g/dL (ref 6.1–8.1)

## 2020-02-28 LAB — LIPASE: Lipase: 33 U/L (ref 7–60)

## 2020-02-28 LAB — AMYLASE: Amylase: 59 U/L (ref 21–101)

## 2020-02-28 MED ORDER — IOHEXOL 300 MG/ML  SOLN
100.0000 mL | Freq: Once | INTRAMUSCULAR | Status: AC | PRN
  Administered 2020-02-28: 100 mL via INTRAVENOUS

## 2020-02-28 NOTE — Progress Notes (Signed)
Subjective:    CC: Abdominal pain  HPI: Very pleasant 44 year old male presenting today with reports of abdominal pain.  Notes that the most worrisome of his pain is in the right upper quadrant that extends to the midline chest and around through the back.  He notes this pain is a dull, full ache with periods of sharp stabbing pain that shoots across to the left side of the abdomen.  This pain has been going on for approximately 1 month and is constant.  Notes that he had some juice yesterday which caused an upset stomach.  Also notes that when he eats fatty foods he "does not feel great".  Having normal bowel movements but does occasionally have diarrhea.  Normally he attributes this to fasting for he does not have a lot of p.o. intake and only drinks liquids.  He notes he is also been passing mucus with his stools which is abnormal and only experienced when he eats gluten.  Reports nausea every day on awakening and is unable to tolerate food first thing in the morning.  Lives in overall pretty healthy lifestyle, staying active, avoiding excessive carbohydrates and processed foods.  Does have a history of GERD and celiac disease.  Recently saw gastroenterology who has plan to do an EGD and colonoscopy but will be unable to get to this until the end of July.  Previously took Protonix as needed but was instructed by GI to stop this medication.  They did give him a different medication but he reports that he was unable to tolerate it.  Denies fevers, hematochezia, melena, vomiting, and constipation.  I reviewed the past medical history, family history, social history, surgical history, and allergies today and no changes were needed.  Please see the problem list section below in epic for further details.  Past Medical History: Past Medical History:  Diagnosis Date  . Anxiety   . Arthritis   . Celiac disease   . Hand fracture   . Headache   . Hyperlipidemia   . Lyme disease   . Normal cardiac stress  test 4/10   at [facility] - negative.  terminated for fatigue   Past Surgical History: Past Surgical History:  Procedure Laterality Date  . surgery on right hand     Social History: Social History   Socioeconomic History  . Marital status: Married    Spouse name: Not on file  . Number of children: 1  . Years of education: college  . Highest education level: Not on file  Occupational History  . Occupation: Quarry manager for [employer]  . Smoking status: Former Smoker    Packs/day: 0.20    Types: Cigarettes  . Smokeless tobacco: Never Used  . Tobacco comment: started smoking at age 26, started smokeless tobacco at age 64  Substance and Sexual Activity  . Alcohol use: No  . Drug use: No  . Sexual activity: Not on file  Other Topics Concern  . Not on file  Social History Narrative   17 years of education   Married to [place] with 1 son   Mother in law lives in the home   Right-handed.   No daily caffeine use.    Social Determinants of Health   Financial Resource Strain:   . Difficulty of Paying Living Expenses:   Food Insecurity:   . Worried About Charity fundraiser in the Last Year:   . Arboriculturist in the Last Year:   [employer]  Needs:   . Lack of Transportation (Medical):   Marland Kitchen Lack of Transportation (Non-Medical):   Physical Activity:   . Days of Exercise per Week:   . Minutes of Exercise per Session:   Stress:   . Feeling of Stress :   Social Connections:   . Frequency of Communication with Friends and Family:   . Frequency of Social Gatherings with Friends and Family:   . Attends Religious Services:   . Active Member of Clubs or Organizations:   . Attends Archivist Meetings:   Marland Kitchen Marital Status:    Family History: Family History  Problem Relation Age of Onset  . Breast cancer Mother   . Hyperlipidemia Mother   . Hypertension Father   . Hyperlipidemia Father   . Other Other        grandfather with alcoholism  . Heart attack Other         grandmother  . Diabetes Other        grandmother  . Colon cancer Other        great uncles on Dads side  . Breast cancer Other        aunt  . Lung cancer Other        grandmother  . Prostate cancer Other        grandfather  . Melanoma Other        uncle   Allergies: Allergies  Allergen Reactions  . Sulfa Antibiotics Other (See Comments)    headaches  . Acetaminophen   . Naproxen Sodium    Medications: See med rec.  Review of Systems: See HPI for pertinent positives and negatives.   Objective:    General: Well Developed, well nourished, and in no acute distress.  Neuro: Alert and oriented x3.  HEENT: Normocephalic, atraumatic.  Skin: Warm and dry. Cardiac: Regular rate and rhythm, no murmurs rubs or gallops, no lower extremity edema.  Respiratory: Clear to auscultation bilaterally. Not using accessory muscles, speaking in full sentences. Abdomen: Soft, nondistended, diffusely tender to light palpation, worse on the right upper and lower quadrants.  Mild guarding noted with palpation.  Bowel sounds positive x4 quadrants.  No abdominal bruits auscultated.  Impression and Recommendations:    1. Generalized abdominal pain/RUQ pain Checking CBC with differential, CMP, amylase, lipase, and iron panel.  With diffuse abdominal tenderness and severity of pain, will get a stat CT abdomen pelvis with contrast.  Recommend holding off on PPI at this time as we may need to do further testing with stool studies and H. pylori breath test pending current results. - CBC with Differential/Platelet - COMPLETE METABOLIC PANEL WITH GFR - Amylase - Lipase - Fe+TIBC+Fer - CT Abdomen Pelvis W Contrast; Future  Return for further evaluation pending lab/imaging results. ___________________________________________ Clearnce Sorrel, DNP, APRN, FNP-BC Primary Care and Sports Medicine [place]

## 2020-03-01 ENCOUNTER — Encounter: Payer: Self-pay | Admitting: Medical-Surgical

## 2020-03-02 ENCOUNTER — Telehealth: Payer: Self-pay | Admitting: Gastroenterology

## 2020-03-02 NOTE — Telephone Encounter (Signed)
Brian Spears tells me that he wants to use the prep solution recommended to him by Dr Bryan Lemma. He does not mind paying out of pocket  if his insurance doesn't want to pay for it. He just needs to hear from the office.

## 2020-03-02 NOTE — Telephone Encounter (Signed)
Spoke to patient who states that he will pay the cost of Clinpix. Pharmacy notified and stated they will fill it for him and provide a discount card. The prescription will be ready for piick up on Tuesday as it had to be ordered. Patient notified.

## 2020-03-04 ENCOUNTER — Other Ambulatory Visit: Payer: Self-pay | Admitting: Neurology

## 2020-03-04 ENCOUNTER — Other Ambulatory Visit: Payer: Self-pay | Admitting: Sports Medicine

## 2020-03-04 DIAGNOSIS — E7800 Pure hypercholesterolemia, unspecified: Secondary | ICD-10-CM

## 2020-03-09 ENCOUNTER — Encounter: Payer: Self-pay | Admitting: Gastroenterology

## 2020-03-19 ENCOUNTER — Telehealth: Payer: Self-pay | Admitting: Sports Medicine

## 2020-03-19 NOTE — Telephone Encounter (Signed)
Patient called in and reports that he is having something going on with his ear but not sure how to explain other than it feels like its in a barrel.He denies any headace He has made a follow up with PCP. He is aware to go to ER if he has any symptoms and did not have any questions.

## 2020-03-21 ENCOUNTER — Other Ambulatory Visit: Payer: Self-pay

## 2020-03-21 ENCOUNTER — Encounter: Payer: Self-pay | Admitting: Sports Medicine

## 2020-03-21 ENCOUNTER — Other Ambulatory Visit: Payer: Self-pay | Admitting: Gastroenterology

## 2020-03-21 ENCOUNTER — Ambulatory Visit (INDEPENDENT_AMBULATORY_CARE_PROVIDER_SITE_OTHER): Payer: BC Managed Care – PPO | Admitting: Sports Medicine

## 2020-03-21 ENCOUNTER — Ambulatory Visit (INDEPENDENT_AMBULATORY_CARE_PROVIDER_SITE_OTHER): Payer: BC Managed Care – PPO

## 2020-03-21 DIAGNOSIS — Z1159 Encounter for screening for other viral diseases: Secondary | ICD-10-CM

## 2020-03-21 DIAGNOSIS — H9311 Tinnitus, right ear: Secondary | ICD-10-CM | POA: Diagnosis not present

## 2020-03-21 DIAGNOSIS — F411 Generalized anxiety disorder: Secondary | ICD-10-CM

## 2020-03-21 NOTE — Assessment & Plan Note (Signed)
Brian Spears is also having chronic tinnitus in his right ear, he has had brain MRI, maxillofacial CTs, he has seen ENT, no treatment needed or available.

## 2020-03-21 NOTE — Assessment & Plan Note (Signed)
In returns, he continues to have anxiety that affects multiple aspects of his life. He has GI symptoms, likely attributable to IBS, he is having an upper and lower endoscopy on Friday to ensure no structural abnormalities. He is trying to down taper and come off of his alprazolam and BuSpar for some reason, he does not desire to do behavioral therapy and desires to control his anxiety on his own. No suicidal or homicidal ideation. He tells me he really just came in today for reassurance.

## 2020-03-21 NOTE — Progress Notes (Signed)
    Procedures performed today:    None.  Independent interpretation of notes and tests performed by another provider:   None.  Brief History, Exam, Impression, and Recommendations:    Generalized anxiety manifesting as headache In returns, he continues to have anxiety that affects multiple aspects of his life. He has GI symptoms, likely attributable to IBS, he is having an upper and lower endoscopy on Friday to ensure no structural abnormalities. He is trying to down taper and come off of his alprazolam and BuSpar for some reason, he does not desire to do behavioral therapy and desires to control his anxiety on his own. No suicidal or homicidal ideation. He tells me he really just came in today for reassurance.  Tinnitus, right Jorje is also having chronic tinnitus in his right ear, he has had brain MRI, maxillofacial CTs, he has seen ENT, no treatment needed or available.    ___________________________________________ Gwen Her. Dianah Field, M.D., ABFM., CAQSM. Primary Care and [place] Instructor of [place] [facility]

## 2020-03-22 LAB — SARS CORONAVIRUS 2 (TAT 6-24 HRS): SARS Coronavirus 2: NEGATIVE

## 2020-03-23 ENCOUNTER — Other Ambulatory Visit: Payer: Self-pay | Admitting: Gastroenterology

## 2020-03-23 ENCOUNTER — Ambulatory Visit (AMBULATORY_SURGERY_CENTER): Payer: BC Managed Care – PPO | Admitting: Gastroenterology

## 2020-03-23 ENCOUNTER — Encounter: Payer: Self-pay | Admitting: Gastroenterology

## 2020-03-23 ENCOUNTER — Other Ambulatory Visit: Payer: Self-pay

## 2020-03-23 VITALS — BP 106/48 | HR 66 | Temp 98.9°F | Resp 13 | Ht 68.0 in | Wt 179.0 lb

## 2020-03-23 DIAGNOSIS — K319 Disease of stomach and duodenum, unspecified: Secondary | ICD-10-CM

## 2020-03-23 DIAGNOSIS — R1011 Right upper quadrant pain: Secondary | ICD-10-CM

## 2020-03-23 DIAGNOSIS — D123 Benign neoplasm of transverse colon: Secondary | ICD-10-CM | POA: Diagnosis not present

## 2020-03-23 DIAGNOSIS — K2970 Gastritis, unspecified, without bleeding: Secondary | ICD-10-CM

## 2020-03-23 DIAGNOSIS — D122 Benign neoplasm of ascending colon: Secondary | ICD-10-CM

## 2020-03-23 DIAGNOSIS — K5730 Diverticulosis of large intestine without perforation or abscess without bleeding: Secondary | ICD-10-CM

## 2020-03-23 DIAGNOSIS — K640 First degree hemorrhoids: Secondary | ICD-10-CM

## 2020-03-23 DIAGNOSIS — D120 Benign neoplasm of cecum: Secondary | ICD-10-CM

## 2020-03-23 DIAGNOSIS — Z1211 Encounter for screening for malignant neoplasm of colon: Secondary | ICD-10-CM

## 2020-03-23 DIAGNOSIS — D124 Benign neoplasm of descending colon: Secondary | ICD-10-CM

## 2020-03-23 DIAGNOSIS — K635 Polyp of colon: Secondary | ICD-10-CM | POA: Diagnosis not present

## 2020-03-23 DIAGNOSIS — K2950 Unspecified chronic gastritis without bleeding: Secondary | ICD-10-CM | POA: Diagnosis not present

## 2020-03-23 MED ORDER — SODIUM CHLORIDE 0.9 % IV SOLN: 500.0000 mL | Freq: Once | INTRAVENOUS | Status: DC

## 2020-03-23 MED ORDER — OMEPRAZOLE 20 MG PO CPDR
20.0000 mg | DELAYED_RELEASE_CAPSULE | Freq: Two times a day (BID) | ORAL | 2 refills | Status: DC
Start: 2020-03-23 — End: 2021-04-11

## 2020-03-23 NOTE — Progress Notes (Signed)
VS- Brian Spears 

## 2020-03-23 NOTE — Patient Instructions (Signed)
Impression/Recommendations:  Gastritis handout given to patient. Polyp handout given to patient. Diverticulosis handout given to patient. Hemorrhoid handout given to patient.  Resume previous diet. Continue present medications. Await pathology results.  Use Prilosec (omeprazole) 20 mg. By mouth 2 times daily for 6 weeks.  Then reduce to 20 mg/day, then can stop if symptoms don't return.  Return to GI office as needed.  Use fiber, for example Citrucel, Fibercon, Konsyl, or Metamucil.  If interested in further treatment of hemorrhoids, contact Dr. Grace Isaac clinic to set up an appointment for evaluation and treatment.  YOU HAD AN ENDOSCOPIC PROCEDURE TODAY AT [facility]:   Refer to the procedure report that was given to you for any specific questions about what was found during the examination.  If the procedure report does not answer your questions, please call your gastroenterologist to clarify.  If you requested that your care partner not be given the details of your procedure findings, then the procedure report has been included in a sealed envelope for you to review at your convenience later.  YOU SHOULD EXPECT: Some feelings of bloating in the abdomen. Passage of more gas than usual.  Walking can help get rid of the air that was put into your GI tract during the procedure and reduce the bloating. If you had a lower endoscopy (such as a colonoscopy or flexible sigmoidoscopy) you may notice spotting of blood in your stool or on the toilet paper. If you underwent a bowel prep for your procedure, you may not have a normal bowel movement for a few days.  Please Note:  You might notice some irritation and congestion in your nose or some drainage.  This is from the oxygen used during your procedure.  There is no need for concern and it should clear up in a day or so.  SYMPTOMS TO REPORT IMMEDIATELY:   Following lower endoscopy (colonoscopy or flexible  sigmoidoscopy):  Excessive amounts of blood in the stool  Significant tenderness or worsening of abdominal pains  Swelling of the abdomen that is new, acute  Fever of 100F or higher   Following upper endoscopy (EGD)  Vomiting of blood or coffee ground material  New chest pain or pain under the shoulder blades  Painful or persistently difficult swallowing  New shortness of breath  Fever of 100F or higher  Black, tarry-looking stools  For urgent or emergent issues, a gastroenterologist can be reached at any hour by calling [phone]. Do not use MyChart messaging for urgent concerns.    DIET:  We do recommend a small meal at first, but then you may proceed to your regular diet.  Drink plenty of fluids but you should avoid alcoholic beverages for 24 hours.  ACTIVITY:  You should plan to take it easy for the rest of today and you should NOT DRIVE or use heavy machinery until tomorrow (because of the sedation medicines used during the test).    FOLLOW UP: Our staff will call the number listed on your records 48-72 hours following your procedure to check on you and address any questions or concerns that you may have regarding the information given to you following your procedure. If we do not reach you, we will leave a message.  We will attempt to reach you two times.  During this call, we will ask if you have developed any symptoms of COVID 19. If you develop any symptoms (ie: fever, flu-like symptoms, shortness of breath, cough etc.) before then, please  call [phone].  If you test positive for Covid 19 in the 2 weeks post procedure, please call and report this information to [place].    If any biopsies were taken you will be contacted by phone or by letter within the next 1-3 weeks.  Please call us at [phone] if you have not heard about the biopsies in 3 weeks.    SIGNATURES/CONFIDENTIALITY: You and/or your care partner have signed paperwork which will be entered into your  electronic medical record.  These signatures attest to the fact that that the information above on your After Visit Summary has been reviewed and is understood.  Full responsibility of the confidentiality of this discharge information lies with you and/or your care-partner.

## 2020-03-23 NOTE — Progress Notes (Signed)
To PACU, VSS. Report to Rn.tb 

## 2020-03-23 NOTE — Progress Notes (Signed)
Physician aware of additional meds.tb

## 2020-03-23 NOTE — Progress Notes (Signed)
Called to room to assist during endoscopic procedure.  Patient ID and intended procedure confirmed with present staff. Received instructions for my participation in the procedure from the performing physician.  

## 2020-03-23 NOTE — Op Note (Signed)
[place] Patient Name: Brian Spears Procedure Date: 03/23/2020 2:08 PM MRN: [id number] Endoscopist: Gerrit Heck , MD Age: 44 Referring MD:  Date of Birth: Dec 19, 1975 Gender: Male Account #: [id number] Procedure:                Upper GI endoscopy Indications:              Epigastric abdominal pain, Abdominal pain in the                            right upper quadrant                           Elevated gliadin IgG in 2015, with otherwise normal                            TTG IgA and gliadin IgA at that time. Has                            maintained gluten-free diet since then. Medicines:                Monitored Anesthesia Care Procedure:                Pre-Anesthesia Assessment:                           - Prior to the procedure, a History and Physical                            was performed, and patient medications and                            allergies were reviewed. The patient's tolerance of                            previous anesthesia was also reviewed. The risks                            and benefits of the procedure and the sedation                            options and risks were discussed with the patient.                            All questions were answered, and informed consent                            was obtained. Prior Anticoagulants: The patient has                            taken no previous anticoagulant or antiplatelet                            agents. ASA Grade Assessment: II - A patient with  mild systemic disease. After reviewing the risks                            and benefits, the patient was deemed in                            satisfactory condition to undergo the procedure.                           After obtaining informed consent, the endoscope was                            passed under direct vision. Throughout the                            procedure, the patient's blood pressure, pulse, and                             oxygen saturations were monitored continuously. The                            Endoscope was introduced through the mouth, and                            advanced to the second part of duodenum. The upper                            GI endoscopy was accomplished without difficulty.                            The patient tolerated the procedure well. Scope In: Scope Out: Findings:                 The examined esophagus was normal.                           The Z-line was regular and was found 40 cm from the                            incisors.                           Scattered mild inflammation characterized by                            erythema was found in the gastric fundus, in the                            gastric body and in the gastric antrum. Biopsies                            were taken with a cold forceps for Helicobacter                            pylori  testing. Estimated blood loss was minimal.                           The duodenal bulb, first portion of the duodenum                            and second portion of the duodenum were normal.                            Biopsies for histology were taken with a cold                            forceps for evaluation of celiac disease. Estimated                            blood loss was minimal. Complications:            No immediate complications. Estimated Blood Loss:     Estimated blood loss was minimal. Impression:               - Normal esophagus.                           - Z-line regular, 40 cm from the incisors.                           - Gastritis. Biopsied.                           - Normal duodenal bulb, first portion of the                            duodenum and second portion of the duodenum.                            Biopsied. Recommendation:           - Patient has a contact number available for                            emergencies. The signs and symptoms of potential                             delayed complications were discussed with the                            patient. Return to normal activities tomorrow.                            Written discharge instructions were provided to the                            patient.                           - Resume previous diet.                           -  Continue present medications.                           - Await pathology results.                           - Use Prilosec (omeprazole) 20 mg PO BID for 6                            weeks to promote mucosal healing, then reduce to 20                            mg/day then can stop if no return of symptoms.                           - Perform a colonoscopy today. Gerrit Heck, MD 03/23/2020 3:17:31 PM

## 2020-03-23 NOTE — Op Note (Signed)
[place] Patient Name: Brian Spears Procedure Date: 03/23/2020 2:07 PM MRN: [id number] Endoscopist: Gerrit Heck , MD Age: 44 Referring MD:  Date of Birth: Nov 26, 1975 Gender: Male Account #: [id number] Procedure:                Colonoscopy Indications:              Screening for colorectal malignant neoplasm, This                            is the patient's first colonoscopy Medicines:                Monitored Anesthesia Care Procedure:                Pre-Anesthesia Assessment:                           - Prior to the procedure, a History and Physical                            was performed, and patient medications and                            allergies were reviewed. The patient's tolerance of                            previous anesthesia was also reviewed. The risks                            and benefits of the procedure and the sedation                            options and risks were discussed with the patient.                            All questions were answered, and informed consent                            was obtained. Prior Anticoagulants: The patient has                            taken no previous anticoagulant or antiplatelet                            agents. ASA Grade Assessment: II - A patient with                            mild systemic disease. After reviewing the risks                            and benefits, the patient was deemed in                            satisfactory condition to undergo the procedure.  After obtaining informed consent, the colonoscope                            was passed under direct vision. Throughout the                            procedure, the patient's blood pressure, pulse, and                            oxygen saturations were monitored continuously. The                            Colonoscope was introduced through the anus and                            advanced to the the terminal  ileum. The colonoscopy                            was performed without difficulty. The patient                            tolerated the procedure well. The quality of the                            bowel preparation was good. The terminal ileum,                            ileocecal valve, appendiceal orifice, and rectum                            were photographed. Scope In: 2:46:52 PM Scope Out: 3:07:13 PM Scope Withdrawal Time: 0 hours 17 minutes 24 seconds  Total Procedure Duration: 0 hours 20 minutes 21 seconds  Findings:                 The perianal and digital rectal examinations were                            normal.                           A 2 mm polyp was found in the cecum. The polyp was                            sessile. The polyp was removed with a cold biopsy                            forceps. Resection and retrieval were complete.                            Estimated blood loss was minimal.                           Two sessile polyps were found in the descending  colon and ascending colon. The polyps were 4 to 5                            mm in size. These polyps were removed with a cold                            snare. Resection and retrieval were complete.                            Estimated blood loss was minimal.                           A 8 mm polyp was found in the transverse colon. The                            polyp was flat. The polyp was removed with a cold                            snare. Resection and retrieval were complete.                            Estimated blood loss was minimal.                           A few small-mouthed diverticula were found in the                            sigmoid colon.                           Non-bleeding internal hemorrhoids were found during                            retroflexion. The hemorrhoids were small and Grade                            I (internal hemorrhoids that do not  prolapse).                           The terminal ileum appeared normal. Complications:            No immediate complications. Estimated Blood Loss:     Estimated blood loss was minimal. Impression:               - One 2 mm polyp in the cecum, removed with a cold                            biopsy forceps. Resected and retrieved.                           - Two 4 to 5 mm polyps in the descending colon and                            in the ascending  colon, removed with a cold snare.                            Resected and retrieved.                           - One 8 mm polyp in the transverse colon, removed                            with a cold snare. Resected and retrieved.                           - Diverticulosis in the sigmoid colon.                           - Non-bleeding internal hemorrhoids.                           - The examined portion of the ileum was normal. Recommendation:           - Patient has a contact number available for                            emergencies. The signs and symptoms of potential                            delayed complications were discussed with the                            patient. Return to normal activities tomorrow.                            Written discharge instructions were provided to the                            patient.                           - Resume previous diet.                           - Continue present medications.                           - Await pathology results.                           - Repeat colonoscopy for surveillance based on                            pathology results.                           - Return to GI office PRN.                           - Internal hemorrhoids were noted on  this study and                            may be amenable to hemorrhoid band ligation. If you                            are interested in further treatment of these                            hemorrhoids with band ligation,  please contact my                            clinic to set up an appointment for evaluation and                            treatment.                           - Use fiber, for example Citrucel, Fibercon, Konsyl                            or Metamucil. Gerrit Heck, MD 03/23/2020 3:21:17 PM

## 2020-03-24 ENCOUNTER — Telehealth: Payer: Self-pay | Admitting: Internal Medicine

## 2020-03-24 NOTE — Telephone Encounter (Signed)
Brian Spears, Patient called today saying that the pharmacy would not fill his prescription as you indicated after his recent procedures (which I reviewed).  You wanted him on omeprazole 20 mg twice daily.  I am unsure with the issue might be (nor was he).  However, I told him to pick up Prilosec OTC and take 20 mg twice daily and that your nurse would sort things out first thing Monday morning and give the patient a call.  Please make sure your nurse sort things out regarding his prescription and gives the patient a call.  Thanks. Jenny Reichmann

## 2020-03-25 NOTE — Telephone Encounter (Signed)
Thanks John, will do.  Claiborne Billings, Can you please assist this patient in obtaining his Rx for Prilosec 20 mg PO BID. Perhaps an insurance issue with the medication and we may have to change Rx. Thanks.

## 2020-03-26 NOTE — Telephone Encounter (Signed)
Called [facility] at [phone] and [place]. Have the PA for the 20 and they will let us know a response

## 2020-03-27 ENCOUNTER — Telehealth: Payer: Self-pay

## 2020-03-27 NOTE — Telephone Encounter (Signed)
PT called back stating that his stomach was still burning and that he was checking on the rx that was given to him Friday after his procedure.  There is an insurance issue with the Omeprazole 20 mg BID. Spoke with Thurmon Fair and she said its being addressed and they're waiting on an POA. I told the patient that he would be notified and updated today or tomorrow. He verbalized understanding. In the meantime he picked up Prilosec OTC and is taking this.

## 2020-03-27 NOTE — Telephone Encounter (Signed)
Received PA back insurance will not cover.  So I spoke with pharmacy she is going to give patient a 90 day supply.  (He can take 20 mg bid) once he runs out he will have to ger over- the counter until he starts 40 mg once daily.  Patient verbalized understanding. He will call us once he runs out.

## 2020-03-27 NOTE — Telephone Encounter (Signed)
  Follow up Call-  Call back number 03/23/2020  Post procedure Call Back phone  # [phone]  Permission to leave phone message Yes  Some recent data might be hidden     Patient questions:  Do you have a fever, pain , or abdominal swelling? No. Pain Score  0 *  Have you tolerated food without any problems? Yes.    Have you been able to return to your normal activities? Yes.    Do you have any questions about your discharge instructions: Diet   No. Medications  No. Follow up visit  No.  Do you have questions or concerns about your Care? No.  Actions: * If pain score is 4 or above: No action needed, pain <4.  1. Have you developed a fever since your procedure? no  2.   Have you had an respiratory symptoms (SOB or cough) since your procedure? no  3.   Have you tested positive for COVID 19 since your procedure no  4.   Have you had any family members/close contacts diagnosed with the COVID 19 since your procedure?  no   If yes to any of these questions please route to Joylene John, RN and Erenest Rasher, RN

## 2020-03-27 NOTE — Telephone Encounter (Signed)
1st follow up call made.  NAULM

## 2020-03-29 ENCOUNTER — Encounter: Payer: Self-pay | Admitting: Gastroenterology

## 2020-04-05 DIAGNOSIS — H938X1 Other specified disorders of right ear: Secondary | ICD-10-CM | POA: Diagnosis not present

## 2020-04-05 DIAGNOSIS — G500 Trigeminal neuralgia: Secondary | ICD-10-CM | POA: Diagnosis not present

## 2020-04-05 DIAGNOSIS — J358 Other chronic diseases of tonsils and adenoids: Secondary | ICD-10-CM | POA: Diagnosis not present

## 2020-04-12 DIAGNOSIS — M2550 Pain in unspecified joint: Secondary | ICD-10-CM | POA: Diagnosis not present

## 2020-04-12 DIAGNOSIS — K900 Celiac disease: Secondary | ICD-10-CM | POA: Diagnosis not present

## 2020-04-12 DIAGNOSIS — R5383 Other fatigue: Secondary | ICD-10-CM | POA: Diagnosis not present

## 2020-04-12 DIAGNOSIS — E538 Deficiency of other specified B group vitamins: Secondary | ICD-10-CM | POA: Diagnosis not present

## 2020-04-12 DIAGNOSIS — Z131 Encounter for screening for diabetes mellitus: Secondary | ICD-10-CM | POA: Diagnosis not present

## 2020-04-12 DIAGNOSIS — Z9189 Other specified personal risk factors, not elsewhere classified: Secondary | ICD-10-CM | POA: Diagnosis not present

## 2020-04-12 DIAGNOSIS — Z889 Allergy status to unspecified drugs, medicaments and biological substances status: Secondary | ICD-10-CM | POA: Diagnosis not present

## 2020-04-12 DIAGNOSIS — M791 Myalgia, unspecified site: Secondary | ICD-10-CM | POA: Diagnosis not present

## 2020-04-12 DIAGNOSIS — E559 Vitamin D deficiency, unspecified: Secondary | ICD-10-CM | POA: Diagnosis not present

## 2020-05-09 ENCOUNTER — Encounter: Payer: Self-pay | Admitting: Sports Medicine

## 2020-05-09 ENCOUNTER — Ambulatory Visit (INDEPENDENT_AMBULATORY_CARE_PROVIDER_SITE_OTHER): Payer: BC Managed Care – PPO | Admitting: Sports Medicine

## 2020-05-09 ENCOUNTER — Ambulatory Visit (INDEPENDENT_AMBULATORY_CARE_PROVIDER_SITE_OTHER): Payer: BC Managed Care – PPO

## 2020-05-09 ENCOUNTER — Other Ambulatory Visit: Payer: Self-pay

## 2020-05-09 VITALS — BP 117/71 | HR 71 | Ht 68.0 in | Wt 179.0 lb

## 2020-05-09 DIAGNOSIS — Z0000 Encounter for general adult medical examination without abnormal findings: Secondary | ICD-10-CM | POA: Diagnosis not present

## 2020-05-09 DIAGNOSIS — M19021 Primary osteoarthritis, right elbow: Secondary | ICD-10-CM | POA: Diagnosis not present

## 2020-05-09 DIAGNOSIS — M19022 Primary osteoarthritis, left elbow: Secondary | ICD-10-CM

## 2020-05-09 DIAGNOSIS — E7800 Pure hypercholesterolemia, unspecified: Secondary | ICD-10-CM

## 2020-05-09 NOTE — Assessment & Plan Note (Signed)
Right elbow injection today. This was last done in March 2021.

## 2020-05-09 NOTE — Assessment & Plan Note (Signed)
Annual physical as above. Checking routine labs. He was having several vague symptoms, he finally got into [name] and they have started him on some supplements which have been profoundly efficacious.

## 2020-05-09 NOTE — Progress Notes (Signed)
    Procedures performed today:    Procedure: Real-time Ultrasound Guided injection of the right elbow joint Device: Samsung HS60  Verbal informed consent obtained.  Time-out conducted.  Noted no overlying erythema, induration, or other signs of local infection.  Skin prepped in a sterile fashion.  Local anesthesia: Topical Ethyl chloride.  With sterile technique and under real time ultrasound guidance: 1 cc Kenalog 40, 1 cc lidocaine, 1 cc bupivacaine injected easily Completed without difficulty  Pain immediately resolved suggesting accurate placement of the medication.  Advised to call if fevers/chills, erythema, induration, drainage, or persistent bleeding.  Images permanently stored and available for review in PACS.  Impression: Technically successful ultrasound guided injection.  ___________________________________________ Gwen Her. Dianah Field, M.D., ABFM., CAQSM. Primary Care and [place] Instructor of [place] of [name]  Independent interpretation of notes and tests performed by another provider:   None.  Brief History, Exam, Impression, and Recommendations:    Annual physical exam Annual physical as above. Checking routine labs. He was having several vague symptoms, he finally got into [name] and they have started him on some supplements which have been profoundly efficacious.   Primary osteoarthritis of both elbows Right elbow injection today. This was last done in March 2021.     ___________________________________________ Gwen Her. Dianah Field, M.D., ABFM., CAQSM. Primary Care and [place] Instructor of [place] of [facility] Medicine

## 2020-05-10 LAB — COMPLETE METABOLIC PANEL WITH GFR
AG Ratio: 1.6 (calc) (ref 1.0–2.5)
ALT: 30 U/L (ref 9–46)
AST: 24 U/L (ref 10–40)
Albumin: 4.6 g/dL (ref 3.6–5.1)
Alkaline phosphatase (APISO): 63 U/L (ref 36–130)
BUN: 13 mg/dL (ref 7–25)
CO2: 29 mmol/L (ref 20–32)
Calcium: 10.1 mg/dL (ref 8.6–10.3)
Chloride: 103 mmol/L (ref 98–110)
Creat: 0.91 mg/dL (ref 0.60–1.35)
GFR, Est African American: 118 mL/min/{1.73_m2} (ref >=60)
GFR, Est Non African American: 101 mL/min/{1.73_m2} (ref >=60)
Globulin: 2.8 g/dL (calc) (ref 1.9–3.7)
Glucose, Bld: 94 mg/dL (ref 65–99)
Potassium: 4.6 mmol/L (ref 3.5–5.3)
Sodium: 138 mmol/L (ref 135–146)
Total Bilirubin: 0.6 mg/dL (ref 0.2–1.2)
Total Protein: 7.4 g/dL (ref 6.1–8.1)

## 2020-05-10 LAB — CBC
HCT: 48 % (ref 38.5–50.0)
Hemoglobin: 15.8 g/dL (ref 13.2–17.1)
MCH: 28.4 pg (ref 27.0–33.0)
MCHC: 32.9 g/dL (ref 32.0–36.0)
MCV: 86.3 fL (ref 80.0–100.0)
MPV: 10.8 fL (ref 7.5–12.5)
Platelets: 255 10*3/uL (ref 140–400)
RBC: 5.56 10*6/uL (ref 4.20–5.80)
RDW: 11.9 % (ref 11.0–15.0)
WBC: 5.3 10*3/uL (ref 3.8–10.8)

## 2020-05-10 LAB — LIPID PANEL W/REFLEX DIRECT LDL
Cholesterol: 215 mg/dL — ABNORMAL HIGH (ref <200)
HDL: 57 mg/dL (ref >=40)
LDL Cholesterol (Calc): 140 mg/dL (calc) — ABNORMAL HIGH
Non-HDL Cholesterol (Calc): 158 mg/dL (calc) — ABNORMAL HIGH (ref <130)
Total CHOL/HDL Ratio: 3.8 (calc) (ref <5.0)
Triglycerides: 83 mg/dL (ref <150)

## 2020-05-10 LAB — TSH: TSH: 1.53 mIU/L (ref 0.40–4.50)

## 2020-05-23 DIAGNOSIS — M2550 Pain in unspecified joint: Secondary | ICD-10-CM | POA: Diagnosis not present

## 2020-05-23 DIAGNOSIS — K900 Celiac disease: Secondary | ICD-10-CM | POA: Diagnosis not present

## 2020-05-23 DIAGNOSIS — Z9189 Other specified personal risk factors, not elsewhere classified: Secondary | ICD-10-CM | POA: Diagnosis not present

## 2020-05-23 DIAGNOSIS — E612 Magnesium deficiency: Secondary | ICD-10-CM | POA: Diagnosis not present

## 2020-08-08 DIAGNOSIS — E559 Vitamin D deficiency, unspecified: Secondary | ICD-10-CM | POA: Diagnosis not present

## 2020-08-08 DIAGNOSIS — R7303 Prediabetes: Secondary | ICD-10-CM | POA: Diagnosis not present

## 2020-08-08 DIAGNOSIS — E538 Deficiency of other specified B group vitamins: Secondary | ICD-10-CM | POA: Diagnosis not present

## 2020-08-08 DIAGNOSIS — Z789 Other specified health status: Secondary | ICD-10-CM | POA: Diagnosis not present

## 2020-08-08 DIAGNOSIS — M2550 Pain in unspecified joint: Secondary | ICD-10-CM | POA: Diagnosis not present

## 2020-08-08 DIAGNOSIS — R5383 Other fatigue: Secondary | ICD-10-CM | POA: Diagnosis not present

## 2020-08-08 DIAGNOSIS — Z9189 Other specified personal risk factors, not elsewhere classified: Secondary | ICD-10-CM | POA: Diagnosis not present

## 2020-08-08 DIAGNOSIS — E039 Hypothyroidism, unspecified: Secondary | ICD-10-CM | POA: Diagnosis not present

## 2020-08-08 DIAGNOSIS — E612 Magnesium deficiency: Secondary | ICD-10-CM | POA: Diagnosis not present

## 2020-08-08 DIAGNOSIS — K900 Celiac disease: Secondary | ICD-10-CM | POA: Diagnosis not present

## 2020-08-22 DIAGNOSIS — K900 Celiac disease: Secondary | ICD-10-CM | POA: Diagnosis not present

## 2020-08-22 DIAGNOSIS — Z9189 Other specified personal risk factors, not elsewhere classified: Secondary | ICD-10-CM | POA: Diagnosis not present

## 2020-08-22 DIAGNOSIS — E612 Magnesium deficiency: Secondary | ICD-10-CM | POA: Diagnosis not present

## 2020-08-22 DIAGNOSIS — M2550 Pain in unspecified joint: Secondary | ICD-10-CM | POA: Diagnosis not present

## 2020-09-10 NOTE — Telephone Encounter (Signed)
Spoke with patient, he is 12 days out from diagnosis and would like to schedule an appt to be seen when he is at his full 14 days. Call transferred to the front desk for scheduling.

## 2020-09-10 NOTE — Telephone Encounter (Signed)
Appointment needed, and remind him that ivermectin made him likely because it is not proven nor is it recommended for COVID-19.  If he was vaccinated I could see him now but he needs to wait a full 14 days after the diagnosis, as I believe he is unvaccinated.

## 2020-09-17 ENCOUNTER — Ambulatory Visit: Payer: BC Managed Care – PPO | Admitting: Sports Medicine

## 2020-09-17 ENCOUNTER — Telehealth: Payer: Self-pay | Admitting: Sports Medicine

## 2020-09-17 DIAGNOSIS — U071 COVID-19: Secondary | ICD-10-CM | POA: Insufficient documentation

## 2020-09-17 NOTE — Telephone Encounter (Signed)
Pt  Was on the schedule to Dr T today but due to weather, we are not seeing pts in the office. Dr T can you go ahead and order a Chest Xray to evaluate post covid?. Patient States he has some congestion and would like to make sure he is clearing up.

## 2020-09-17 NOTE — Assessment & Plan Note (Signed)
45 year old COVID anti-vaxxer, multiple recurrent episodes of COVID-19, he has recovered from this episode, adding a chest x-ray.

## 2020-09-17 NOTE — Telephone Encounter (Signed)
Chest x-ray ordered, he needs a vaccine because this will continue and every time he gets COVID his lungs get scarred.

## 2020-09-20 ENCOUNTER — Other Ambulatory Visit: Payer: Self-pay

## 2020-09-20 ENCOUNTER — Ambulatory Visit (INDEPENDENT_AMBULATORY_CARE_PROVIDER_SITE_OTHER): Payer: BC Managed Care – PPO

## 2020-09-20 DIAGNOSIS — U071 COVID-19: Secondary | ICD-10-CM

## 2020-09-21 DIAGNOSIS — M2550 Pain in unspecified joint: Secondary | ICD-10-CM | POA: Diagnosis not present

## 2020-09-21 DIAGNOSIS — E039 Hypothyroidism, unspecified: Secondary | ICD-10-CM | POA: Diagnosis not present

## 2020-09-21 DIAGNOSIS — Z9189 Other specified personal risk factors, not elsewhere classified: Secondary | ICD-10-CM | POA: Diagnosis not present

## 2020-09-21 DIAGNOSIS — K900 Celiac disease: Secondary | ICD-10-CM | POA: Diagnosis not present

## 2020-10-02 DIAGNOSIS — F419 Anxiety disorder, unspecified: Secondary | ICD-10-CM | POA: Diagnosis not present

## 2020-10-03 DIAGNOSIS — Z630 Problems in relationship with spouse or partner: Secondary | ICD-10-CM | POA: Diagnosis not present

## 2020-10-03 DIAGNOSIS — F4320 Adjustment disorder, unspecified: Secondary | ICD-10-CM | POA: Diagnosis not present

## 2020-10-08 DIAGNOSIS — R5383 Other fatigue: Secondary | ICD-10-CM | POA: Diagnosis not present

## 2020-10-08 DIAGNOSIS — M2550 Pain in unspecified joint: Secondary | ICD-10-CM | POA: Diagnosis not present

## 2020-10-08 DIAGNOSIS — Z9189 Other specified personal risk factors, not elsewhere classified: Secondary | ICD-10-CM | POA: Diagnosis not present

## 2020-10-08 DIAGNOSIS — K900 Celiac disease: Secondary | ICD-10-CM | POA: Diagnosis not present

## 2020-10-12 DIAGNOSIS — Z630 Problems in relationship with spouse or partner: Secondary | ICD-10-CM | POA: Diagnosis not present

## 2020-10-12 DIAGNOSIS — F4320 Adjustment disorder, unspecified: Secondary | ICD-10-CM | POA: Diagnosis not present

## 2020-10-15 ENCOUNTER — Ambulatory Visit: Payer: BC Managed Care – PPO | Admitting: Professional

## 2020-10-17 DIAGNOSIS — Z630 Problems in relationship with spouse or partner: Secondary | ICD-10-CM | POA: Diagnosis not present

## 2020-10-17 DIAGNOSIS — F4320 Adjustment disorder, unspecified: Secondary | ICD-10-CM | POA: Diagnosis not present

## 2020-11-02 DIAGNOSIS — Z630 Problems in relationship with spouse or partner: Secondary | ICD-10-CM | POA: Diagnosis not present

## 2020-11-02 DIAGNOSIS — F4320 Adjustment disorder, unspecified: Secondary | ICD-10-CM | POA: Diagnosis not present

## 2020-11-06 DIAGNOSIS — Z630 Problems in relationship with spouse or partner: Secondary | ICD-10-CM | POA: Diagnosis not present

## 2020-11-06 DIAGNOSIS — F4320 Adjustment disorder, unspecified: Secondary | ICD-10-CM | POA: Diagnosis not present

## 2020-11-07 DIAGNOSIS — K900 Celiac disease: Secondary | ICD-10-CM | POA: Diagnosis not present

## 2020-11-07 DIAGNOSIS — E538 Deficiency of other specified B group vitamins: Secondary | ICD-10-CM | POA: Diagnosis not present

## 2020-11-07 DIAGNOSIS — Z9189 Other specified personal risk factors, not elsewhere classified: Secondary | ICD-10-CM | POA: Diagnosis not present

## 2020-11-07 DIAGNOSIS — R7303 Prediabetes: Secondary | ICD-10-CM | POA: Diagnosis not present

## 2020-11-07 DIAGNOSIS — E039 Hypothyroidism, unspecified: Secondary | ICD-10-CM | POA: Diagnosis not present

## 2020-11-07 DIAGNOSIS — M2550 Pain in unspecified joint: Secondary | ICD-10-CM | POA: Diagnosis not present

## 2020-11-07 DIAGNOSIS — E612 Magnesium deficiency: Secondary | ICD-10-CM | POA: Diagnosis not present

## 2020-11-07 DIAGNOSIS — M791 Myalgia, unspecified site: Secondary | ICD-10-CM | POA: Diagnosis not present

## 2020-11-07 DIAGNOSIS — R5383 Other fatigue: Secondary | ICD-10-CM | POA: Diagnosis not present

## 2020-11-07 DIAGNOSIS — E559 Vitamin D deficiency, unspecified: Secondary | ICD-10-CM | POA: Diagnosis not present

## 2020-11-12 DIAGNOSIS — Z630 Problems in relationship with spouse or partner: Secondary | ICD-10-CM | POA: Diagnosis not present

## 2020-11-12 DIAGNOSIS — F4320 Adjustment disorder, unspecified: Secondary | ICD-10-CM | POA: Diagnosis not present

## 2020-11-21 DIAGNOSIS — F4320 Adjustment disorder, unspecified: Secondary | ICD-10-CM | POA: Diagnosis not present

## 2020-11-21 DIAGNOSIS — Z630 Problems in relationship with spouse or partner: Secondary | ICD-10-CM | POA: Diagnosis not present

## 2020-11-23 DIAGNOSIS — K900 Celiac disease: Secondary | ICD-10-CM | POA: Diagnosis not present

## 2020-11-23 DIAGNOSIS — Z9189 Other specified personal risk factors, not elsewhere classified: Secondary | ICD-10-CM | POA: Diagnosis not present

## 2020-11-23 DIAGNOSIS — E039 Hypothyroidism, unspecified: Secondary | ICD-10-CM | POA: Diagnosis not present

## 2020-11-23 DIAGNOSIS — R5383 Other fatigue: Secondary | ICD-10-CM | POA: Diagnosis not present

## 2020-11-28 DIAGNOSIS — Z630 Problems in relationship with spouse or partner: Secondary | ICD-10-CM | POA: Diagnosis not present

## 2020-11-28 DIAGNOSIS — F4320 Adjustment disorder, unspecified: Secondary | ICD-10-CM | POA: Diagnosis not present

## 2020-12-05 DIAGNOSIS — Z630 Problems in relationship with spouse or partner: Secondary | ICD-10-CM | POA: Diagnosis not present

## 2020-12-05 DIAGNOSIS — F4320 Adjustment disorder, unspecified: Secondary | ICD-10-CM | POA: Diagnosis not present

## 2020-12-12 DIAGNOSIS — D225 Melanocytic nevi of trunk: Secondary | ICD-10-CM | POA: Diagnosis not present

## 2020-12-12 DIAGNOSIS — D1801 Hemangioma of skin and subcutaneous tissue: Secondary | ICD-10-CM | POA: Diagnosis not present

## 2020-12-19 DIAGNOSIS — Z630 Problems in relationship with spouse or partner: Secondary | ICD-10-CM | POA: Diagnosis not present

## 2020-12-19 DIAGNOSIS — F4320 Adjustment disorder, unspecified: Secondary | ICD-10-CM | POA: Diagnosis not present

## 2020-12-26 DIAGNOSIS — Z630 Problems in relationship with spouse or partner: Secondary | ICD-10-CM | POA: Diagnosis not present

## 2020-12-26 DIAGNOSIS — F4320 Adjustment disorder, unspecified: Secondary | ICD-10-CM | POA: Diagnosis not present

## 2020-12-28 ENCOUNTER — Encounter: Payer: Self-pay | Admitting: Family Medicine

## 2020-12-28 ENCOUNTER — Ambulatory Visit: Payer: BC Managed Care – PPO | Admitting: Family Medicine

## 2020-12-28 ENCOUNTER — Other Ambulatory Visit: Payer: Self-pay

## 2020-12-28 VITALS — BP 132/73 | HR 89 | Temp 99.0°F | Wt 175.0 lb

## 2020-12-28 DIAGNOSIS — M436 Torticollis: Secondary | ICD-10-CM

## 2020-12-28 DIAGNOSIS — R195 Other fecal abnormalities: Secondary | ICD-10-CM

## 2020-12-28 NOTE — Progress Notes (Signed)
Acute Office Visit  Subjective:    Patient ID: Brian Spears, male    DOB: 1975/11/13, 45 y.o.   MRN: [id number]  Chief Complaint  Patient presents with  . GI Problem    HPI Patient is in today for unusual stool recently.  States for the past 3 weeks he has been having some mildly loose stool that smells unusual. States his stool is usually a little on the loose side because of all of the supplements he is on, but he thinks the BMs recently have just seemed odd. He had sushi with a friend 3 weeks ago who ended up testing positive for e.coli and receiving treatment. He is wondering if he may have e.coli also. Additionally, he also reports running some low grade fevers off and on for the past 3 weeks, but states this does happen occasionally for him anyway because of some autoimmune diseases he has. States this morning his temp was 101F and he has noticed an increase in neck stiffness so he wanted to get checked out.   In regards to neck stiffness, he reports this also has been going on for the past 3 weeks at least. States sometimes he does have tension in his neck, but wanted to be sure that's all it was given the other new symptoms. Reports he has full range of motion and no pain. No dizziness, nausea, fatigue, lethargy.  He denies watery stool, blood in stool, change in stool color, abdominal pain/cramping, change in appetite, change in weight, fatigue, malaise, body aches, nausea, vomiting, urinary changes.  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 Reports he is being treated with antibiotic BEGA (?) for a total of 3 months, currently in his first month.      Past Medical History:  Diagnosis Date  . Anxiety   . Arthritis   . Celiac disease   . Hand fracture   . Headache   . Hyperlipidemia   . Lyme disease   . Migraines   . Normal cardiac stress test 4/10    at [facility] - negative.  terminated for fatigue    Past Surgical History:  Procedure Laterality Date  . surgery on right hand Bilateral     Family History  Problem Relation Age of Onset  . Breast cancer Mother   . Hyperlipidemia Mother   . Hypertension Father   . Hyperlipidemia Father   . Other Other        grandfather with alcoholism  . Heart attack Other        grandmother  . Diabetes Other        grandmother  . Colon cancer Other        great uncles on Dads side  . Breast cancer Other        aunt  . Lung cancer Other        grandmother  . Prostate cancer Other        grandfather  . Melanoma Other        uncle  . Esophageal cancer Neg Hx   . Rectal cancer Neg Hx   . Stomach cancer Neg Hx     Social History   Socioeconomic History  . Marital status: Married    Spouse name: Not on file  . Number of children: 1  . Years of education: college  . Highest education level: Not on  file  Occupational History  . Occupation: Quarry manager for [employer]  . Smoking status: Former Smoker    Packs/day: 0.20    Types: Cigarettes  . Smokeless tobacco: Never Used  . Tobacco comment: started smoking at age 67, started smokeless tobacco at age 65  Vaping Use  . Vaping Use: Some days  Substance and Sexual Activity  . Alcohol use: No  . Drug use: No  . Sexual activity: Not on file  Other Topics Concern  . Not on file  Social History Narrative   17 years of education   Married to [place] with 1 son   Mother in law lives in the home   Right-handed.   No daily caffeine use.    Social Determinants of Health   Financial Resource Strain: Not on file  Food Insecurity: Not on file  Transportation Needs: Not on file  Physical Activity: Not on file  Stress: Not on file  Social Connections: Not on file  Intimate Partner Violence: Not on file    Outpatient Medications Prior to Visit  Medication Sig Dispense Refill  . Multiple Vitamins-Minerals (ZINC PO) Take by mouth  daily.    . OMEGA-3 FATTY ACIDS PO Take by mouth.    . triamcinolone cream (KENALOG) 0.5 % Apply 1 application topically 2 (two) times daily. To affected areas. (Patient taking differently: Apply 1 application topically 2 (two) times daily as needed. To affected areas.) 30 g 3  . omeprazole (PRILOSEC) 20 MG capsule Take 1 capsule (20 mg total) by mouth 2 (two) times daily before a meal. for 6 weeks, then reduce to 20 mg. Per day, then stop if no return of symptoms. (Patient not taking: Reported on 12/28/2020) 60 capsule 2   No facility-administered medications prior to visit.    Allergies  Allergen Reactions  . Sulfa Antibiotics Other (See Comments)    headaches  . Egg White (Diagnostic)   . Gluten Meal     GI issues  . Naproxen Sodium Swelling  . Peanut-Containing Drug Products   . Salmon [Fish Oil]   . Barley Grass Dermatitis, Diarrhea, Nausea And Vomiting and Rash  . Corn-Containing Products Dermatitis, Diarrhea, Nausea And Vomiting and Rash  . Crab (Diagnostic) Dermatitis, Diarrhea, Nausea And Vomiting and Rash  . Oat Grain (Diagnostic) Dermatitis, Diarrhea, Nausea And Vomiting and Rash  . Other Dermatitis, Nausea And Vomiting, Rash and Diarrhea  . Shellfish Allergy Dermatitis, Diarrhea, Nausea And Vomiting and Rash  . Shrimp (Diagnostic) Dermatitis, Diarrhea, Itching and Rash    Review of Systems All review of systems negative except what is listed in the HPI     Objective:    Physical Exam Vitals reviewed.  Constitutional:      Appearance: Normal appearance. He is normal weight.  HENT:     Head: Normocephalic and atraumatic.  Cardiovascular:     Rate and Rhythm: Normal rate and regular rhythm.     Pulses: Normal pulses.     Heart sounds: Normal heart sounds.  Pulmonary:     Effort: Pulmonary effort is normal.     Breath sounds: Normal breath sounds.  Abdominal:     General: Abdomen is flat. Bowel sounds are normal. There is no distension.     Palpations: Abdomen  is soft. There is no mass.     Tenderness: There is no abdominal tenderness. There is no guarding or rebound.     Hernia: No hernia is present.  Musculoskeletal:  General: Normal range of motion.     Cervical back: Normal range of motion and neck supple. No rigidity or tenderness.  Lymphadenopathy:     Cervical: No cervical adenopathy.  Skin:    General: Skin is warm and dry.     Findings: No rash.  Neurological:     General: No focal deficit present.     Mental Status: He is alert and oriented to person, place, and time. Mental status is at baseline.  Psychiatric:        Mood and Affect: Mood normal.        Behavior: Behavior normal.        Thought Content: Thought content normal.        Judgment: Judgment normal.     BP 132/73 (BP Location: Left Arm, Patient Position: Sitting, Cuff Size: Normal)   Pulse 89   Temp 99 F (37.2 C) (Oral)   Wt 175 lb (79.4 kg)   BMI 26.61 kg/m  Wt Readings from Last 3 Encounters:  12/28/20 175 lb (79.4 kg)  05/09/20 179 lb (81.2 kg)  03/23/20 179 lb (81.2 kg)    There are no preventive care reminders to display for this patient.  There are no preventive care reminders to display for this patient.   Lab Results  Component Value Date   TSH 1.53 05/09/2020   Lab Results  Component Value Date   WBC 5.3 05/09/2020   HGB 15.8 05/09/2020   HCT 48.0 05/09/2020   MCV 86.3 05/09/2020   PLT 255 05/09/2020   Lab Results  Component Value Date   NA 138 05/09/2020   K 4.6 05/09/2020   CO2 29 05/09/2020   GLUCOSE 94 05/09/2020   BUN 13 05/09/2020   CREATININE 0.91 05/09/2020   BILITOT 0.6 05/09/2020   ALKPHOS 45 02/05/2017   AST 24 05/09/2020   ALT 30 05/09/2020   PROT 7.4 05/09/2020   ALBUMIN 4.1 02/05/2017   ALBUMIN 4.1 02/05/2017   CALCIUM 10.1 05/09/2020   Lab Results  Component Value Date   CHOL 215 (H) 05/09/2020   Lab Results  Component Value Date   HDL 57 05/09/2020   Lab Results  Component Value Date    LDLCALC 140 (H) 05/09/2020   Lab Results  Component Value Date   TRIG 83 05/09/2020   Lab Results  Component Value Date   CHOLHDL 3.8 05/09/2020   Lab Results  Component Value Date   HGBA1C 5.5 05/16/2019       Assessment & Plan:   1. Loose stools Will go ahead and have patient submit a stool sample for possible infectious causes of symptoms. Also checking CBC today as well. Encouraged adequate hydration and avoiding irritating foods. Will let him know results once they come back.  - CBC - Clostridium difficile culture-fecal - Aerobic culture  2. Neck stiffness Normal musculoskeletal/neuro exam. Likely some tension related stiffness. Gave him a handout for some exercises/stretches to try at home and recommended heating pad. He does not like taking medications, but an NSAID may be helpful. If no improvement with conservative measures he can follow-up with Dr. Darene Lamer.   Educated on signs and symptoms requiring urgent evaluation. Patient agreeable to plan. Follow-up if symptoms worsen or fail to improve.     Terrilyn Saver, NP

## 2020-12-29 LAB — CBC
HCT: 52.5 % — ABNORMAL HIGH (ref 38.5–50.0)
Hemoglobin: 17 g/dL (ref 13.2–17.1)
MCH: 27.8 pg (ref 27.0–33.0)
MCHC: 32.4 g/dL (ref 32.0–36.0)
MCV: 85.9 fL (ref 80.0–100.0)
MPV: 11 fL (ref 7.5–12.5)
Platelets: 273 10*3/uL (ref 140–400)
RBC: 6.11 10*6/uL — ABNORMAL HIGH (ref 4.20–5.80)
RDW: 12.1 % (ref 11.0–15.0)
WBC: 6.9 10*3/uL (ref 3.8–10.8)

## 2020-12-31 DIAGNOSIS — R195 Other fecal abnormalities: Secondary | ICD-10-CM | POA: Diagnosis not present

## 2021-01-02 NOTE — Progress Notes (Signed)
MyChart message sent: Your blood work was unremarkable. RBC and Hct were minimally elevated which can happen if you aren't well-hydrated. We can always reassess this in the future. Once the stool samples have been submitted and resulted we will let you know the results. Please let us know if you need anything else. Don't forget to schedule an appointment with Dr. Darene Lamer if  your neck continues to bother you after a few weeks of NSAIDs, heat, and stretches. Hope you are feeling better!

## 2021-01-04 ENCOUNTER — Ambulatory Visit (INDEPENDENT_AMBULATORY_CARE_PROVIDER_SITE_OTHER): Payer: BC Managed Care – PPO | Admitting: Sports Medicine

## 2021-01-04 ENCOUNTER — Other Ambulatory Visit: Payer: Self-pay

## 2021-01-04 DIAGNOSIS — E291 Testicular hypofunction: Secondary | ICD-10-CM | POA: Diagnosis not present

## 2021-01-04 DIAGNOSIS — F411 Generalized anxiety disorder: Secondary | ICD-10-CM | POA: Diagnosis not present

## 2021-01-04 DIAGNOSIS — K900 Celiac disease: Secondary | ICD-10-CM

## 2021-01-04 MED ORDER — BUSPIRONE HCL 5 MG PO TABS: 5.0000 mg | ORAL_TABLET | Freq: Three times a day (TID) | ORAL | 3 refills | Status: DC

## 2021-01-04 NOTE — Assessment & Plan Note (Signed)
Brian Spears tells me his stools have smelled odd, he did not do the stool C. difficile testing. He tells me one of his friends tested positive for E. coli but did not elucidate what subtype of E. coli, I did inform that E. coli that was a normal flora in the GI tract, and should he have any diarrhea, fevers, pain we could certainly do standard stool cultures and C. difficile toxin testing.

## 2021-01-04 NOTE — Assessment & Plan Note (Signed)
Currently getting testosterone supplementation with [name], he is on a multitude of other supplements. Mildly advice is that he have his PSA tracked along with his testosterone, he tells me T levels have been in the 800s which is acceptable.

## 2021-01-04 NOTE — Assessment & Plan Note (Addendum)
Brian Spears came off of all of his anxiety medicines, not surprisingly anxiety significantly worsened, restarting BuSpar 65m 3 times daily with a 6-week follow-up. He does desire to stay off of benzos and SSRIs. This is exacerbation of a chronic disease.

## 2021-01-04 NOTE — Progress Notes (Signed)
    Procedures performed today:    None.  Independent interpretation of notes and tests performed by another provider:   None.  Brief History, Exam, Impression, and Recommendations:    Generalized anxiety manifesting as headache Maicol came off of all of his anxiety medicines, not surprisingly anxiety significantly worsened, restarting BuSpar 37m 3 times daily with a 6-week follow-up. He does desire to stay off of benzos and SSRIs. This is exacerbation of a chronic disease.  Celiac disease IWoodietells me his stools have smelled odd, he did not do the stool C. difficile testing. He tells me one of his friends tested positive for E. coli but did not elucidate what subtype of E. coli, I did inform that E. coli that was a normal flora in the GI tract, and should he have any diarrhea, fevers, pain we could certainly do standard stool cultures and C. difficile toxin testing.  Male hypogonadism Currently getting testosterone supplementation with [name], he is on a multitude of other supplements. Mildly advice is that he have his PSA tracked along with his testosterone, he tells me T levels have been in the 800s which is acceptable.    ___________________________________________ TGwen Her TDianah Field M.D., ABFM., CAQSM. Primary Care and SMexicoInstructor of FMalad Cityof [facility] Medicine

## 2021-01-05 LAB — CLOSTRIDIUM DIFFICILE CULTURE-FECAL

## 2021-01-07 NOTE — Progress Notes (Signed)
MyChart sent: Your stool was negative for c.diff.

## 2021-01-09 DIAGNOSIS — F4320 Adjustment disorder, unspecified: Secondary | ICD-10-CM | POA: Diagnosis not present

## 2021-01-09 DIAGNOSIS — Z630 Problems in relationship with spouse or partner: Secondary | ICD-10-CM | POA: Diagnosis not present

## 2021-01-16 DIAGNOSIS — F4320 Adjustment disorder, unspecified: Secondary | ICD-10-CM | POA: Diagnosis not present

## 2021-01-16 DIAGNOSIS — Z630 Problems in relationship with spouse or partner: Secondary | ICD-10-CM | POA: Diagnosis not present

## 2021-01-23 DIAGNOSIS — F4320 Adjustment disorder, unspecified: Secondary | ICD-10-CM | POA: Diagnosis not present

## 2021-01-23 DIAGNOSIS — Z630 Problems in relationship with spouse or partner: Secondary | ICD-10-CM | POA: Diagnosis not present

## 2021-01-26 ENCOUNTER — Other Ambulatory Visit: Payer: Self-pay | Admitting: Sports Medicine

## 2021-01-26 DIAGNOSIS — F411 Generalized anxiety disorder: Secondary | ICD-10-CM

## 2021-01-30 DIAGNOSIS — Z630 Problems in relationship with spouse or partner: Secondary | ICD-10-CM | POA: Diagnosis not present

## 2021-01-30 DIAGNOSIS — F4320 Adjustment disorder, unspecified: Secondary | ICD-10-CM | POA: Diagnosis not present

## 2021-02-13 DIAGNOSIS — F4320 Adjustment disorder, unspecified: Secondary | ICD-10-CM | POA: Diagnosis not present

## 2021-02-13 DIAGNOSIS — Z630 Problems in relationship with spouse or partner: Secondary | ICD-10-CM | POA: Diagnosis not present

## 2021-02-15 ENCOUNTER — Encounter: Payer: Self-pay | Admitting: Sports Medicine

## 2021-02-15 ENCOUNTER — Ambulatory Visit: Payer: BC Managed Care – PPO | Admitting: Sports Medicine

## 2021-02-15 ENCOUNTER — Other Ambulatory Visit: Payer: Self-pay

## 2021-02-15 DIAGNOSIS — F411 Generalized anxiety disorder: Secondary | ICD-10-CM | POA: Diagnosis not present

## 2021-02-15 NOTE — Assessment & Plan Note (Signed)
Brian Spears recently came off of all of his anxiety medicines, not surprisingly his anxiety worsened, he is currently going through a separation as well. At the last visit we restarted BuSpar 68m 3 times daily, this seems to have worked well, no dose increase needed, return as needed. Of note he had requested to stay off of SSRIs and benzodiazepines.

## 2021-02-15 NOTE — Progress Notes (Signed)
    Procedures performed today:    None.  Independent interpretation of notes and tests performed by another provider:   None.  Brief History, Exam, Impression, and Recommendations:    Generalized anxiety manifesting as headache D[place] recently came off of all of his anxiety medicines, not surprisingly his anxiety worsened, he is currently going through a separation as well. At the last visit we restarted BuSpar 30m 3 times daily, this seems to have worked well, no dose increase needed, return as needed. Of note he had requested to stay off of SSRIs and benzodiazepines.    ___________________________________________ TGwen Her TDianah Field M.D., ABFM., CAQSM. Primary Care and SAvocaInstructor of FLake Nordenof NMiss[facility] Centerof Medicine

## 2021-02-20 DIAGNOSIS — F4320 Adjustment disorder, unspecified: Secondary | ICD-10-CM | POA: Diagnosis not present

## 2021-02-20 DIAGNOSIS — Z630 Problems in relationship with spouse or partner: Secondary | ICD-10-CM | POA: Diagnosis not present

## 2021-02-26 DIAGNOSIS — K900 Celiac disease: Secondary | ICD-10-CM | POA: Diagnosis not present

## 2021-02-26 DIAGNOSIS — Z7409 Other reduced mobility: Secondary | ICD-10-CM | POA: Diagnosis not present

## 2021-02-26 DIAGNOSIS — E039 Hypothyroidism, unspecified: Secondary | ICD-10-CM | POA: Diagnosis not present

## 2021-02-26 DIAGNOSIS — R5383 Other fatigue: Secondary | ICD-10-CM | POA: Diagnosis not present

## 2021-02-26 DIAGNOSIS — E559 Vitamin D deficiency, unspecified: Secondary | ICD-10-CM | POA: Diagnosis not present

## 2021-02-26 DIAGNOSIS — R7303 Prediabetes: Secondary | ICD-10-CM | POA: Diagnosis not present

## 2021-02-26 DIAGNOSIS — F419 Anxiety disorder, unspecified: Secondary | ICD-10-CM | POA: Diagnosis not present

## 2021-02-26 DIAGNOSIS — E538 Deficiency of other specified B group vitamins: Secondary | ICD-10-CM | POA: Diagnosis not present

## 2021-02-27 DIAGNOSIS — F4320 Adjustment disorder, unspecified: Secondary | ICD-10-CM | POA: Diagnosis not present

## 2021-02-27 DIAGNOSIS — Z630 Problems in relationship with spouse or partner: Secondary | ICD-10-CM | POA: Diagnosis not present

## 2021-03-02 ENCOUNTER — Encounter: Payer: Self-pay | Admitting: Emergency Medicine

## 2021-03-02 ENCOUNTER — Other Ambulatory Visit: Payer: Self-pay

## 2021-03-02 ENCOUNTER — Emergency Department
Admission: EM | Admit: 2021-03-02 | Discharge: 2021-03-02 | Disposition: A | Discharge status: Home / Self Care | Payer: BC Managed Care – PPO | Source: Home / Self Care | Attending: Family Medicine | Admitting: Family Medicine

## 2021-03-02 DIAGNOSIS — L03114 Cellulitis of left upper limb: Secondary | ICD-10-CM

## 2021-03-02 MED ORDER — DOXYCYCLINE HYCLATE 100 MG PO CAPS: 100.0000 mg | ORAL_CAPSULE | Freq: Two times a day (BID) | ORAL | 0 refills | Status: DC

## 2021-03-02 NOTE — ED Provider Notes (Signed)
[name]    CSN: [id number] Arrival date & time: 03/02/21  1139      History   Chief Complaint Chief Complaint  Patient presents with   Tattoo Infection    HPI Brian Spears is a 45 y.o. male.   HPI  Patient has a new tattoo.  The surrounding area is becoming more erythematous, swollen, and hot.  He is worried about an infection.  Patient states otherwise he has been stable. Yes chart is reviewed and he does have a multitude of medical problems.  He feels like his immunity is good.  He thinks this might be why he is tattoo got infected, all of his other tattoos went well.  He tells me he is not going to get any more tattoos  Past Medical History:  Diagnosis Date   Anxiety    Arthritis    Celiac disease    Hand fracture    Headache    Hyperlipidemia    Lyme disease    Migraines    Normal cardiac stress test 4/10   at [facility] - negative.  terminated for fatigue    Patient Active Problem List   Diagnosis Date Noted   COVID-19 09/17/2020   History of Lyme disease 03/25/2019   Chronic migraine 03/10/2019   Generalized anxiety manifesting as headache 01/18/2019   Lateral epicondylitis, right elbow 12/15/2018   Plantar fasciitis, left 03/05/2018   Right shoulder pain 07/02/2017   Tinnitus, right 06/09/2016   Medial epicondylitis of right elbow 02/18/2016   Onychodystrophy 12/05/2014   Primary osteoarthritis of both elbows 12/05/2014   Elevated hemoglobin ([place]) 08/01/2013   Male hypogonadism 06/01/2013   GERD (gastroesophageal reflux disease) 03/01/2013   History of smoking 06/07/2012   Annual physical exam 06/07/2012   Celiac disease 06/30/2011   Hyperlipidemia 09/27/2010   Bilateral carpal tunnel syndrome 09/27/2010    Past Surgical History:  Procedure Laterality Date   surgery on right hand Bilateral        Home Medications    Prior to Admission medications   Medication Sig Start Date End Date Taking? Authorizing Provider  busPIRone  (BUSPAR) 5 MG tablet TAKE 1 TABLET BY MOUTH THREE TIMES A DAY 01/29/21  Yes Silverio Decamp, MD  doxycycline (VIBRAMYCIN) 100 MG capsule Take 1 capsule (100 mg total) by mouth 2 (two) times daily. 03/02/21  Yes Raylene Everts, MD  OMEGA-3 FATTY ACIDS PO Take by mouth.   Yes [provider]  testosterone cypionate (DEPOTESTOSTERONE CYPIONATE) 200 MG/ML injection SMARTSIG:0.5 Milliliter(s) IM Once a Week 02/05/21  Yes [provider]  triamcinolone cream (KENALOG) 0.5 % Apply 1 application topically 2 (two) times daily. To affected areas. Patient taking differently: Apply 1 application topically 2 (two) times daily as needed. To affected areas. 08/31/17  Yes Silverio Decamp, MD  Multiple Vitamins-Minerals (ZINC PO) Take by mouth daily.    [provider]  omeprazole (PRILOSEC) 20 MG capsule Take 1 capsule (20 mg total) by mouth 2 (two) times daily before a meal. for 6 weeks, then reduce to 20 mg. Per day, then stop if no return of symptoms. Patient not taking: Reported on 12/28/2020 03/23/20   Lavena Bullion, DO    Family History Family History  Problem Relation Age of Onset   Breast cancer Mother    Hyperlipidemia Mother    Hypertension Father    Hyperlipidemia Father    Other Other        grandfather with alcoholism  Heart attack Other        grandmother   Diabetes Other        grandmother   Colon cancer Other        great uncles on Dads side   Breast cancer Other        aunt   Lung cancer Other        grandmother   Prostate cancer Other        grandfather   Melanoma Other        uncle   Esophageal cancer Neg Hx    Rectal cancer Neg Hx    Stomach cancer Neg Hx     Social History Social History   Tobacco Use   Smoking status: Former    Packs/day: 0.20    Pack years: 0.00    Types: Cigarettes   Smokeless tobacco: Never   Tobacco comments:    started smoking at age 68, started smokeless tobacco at age 67  Vaping Use   Vaping  Use: Some days  Substance Use Topics   Alcohol use: No   Drug use: No     Allergies   Sulfa antibiotics, Egg white (diagnostic), Gluten meal, Naproxen sodium, Peanut-containing drug products, Salmon [fish oil], Barley grass, Corn-containing products, Crab (diagnostic), Oat grain (diagnostic), Other, Shellfish allergy, and Shrimp (diagnostic)   Review of Systems Review of Systems See HPI  Physical Exam Triage Vital Signs ED Triage Vitals  Enc Vitals Group     BP 03/02/21 1205 (!) 150/88     Pulse Rate 03/02/21 1205 93     Resp --      Temp --      Temp Source 03/02/21 1205 Oral     SpO2 03/02/21 1205 97 %     Weight --      Height --      Head Circumference --      Peak Flow --      Pain Score 03/02/21 1206 5     Pain Loc --      Pain Edu? --      Excl. in [place]? --    No data found.  Updated Vital Signs BP (!) 150/88 (BP Location: Left Arm)   Pulse 93   SpO2 97%      Physical Exam Constitutional:      General: He is not in acute distress.    Appearance: He is well-developed.  HENT:     Head: Normocephalic and atraumatic.  Eyes:     Conjunctiva/sclera: Conjunctivae normal.     Pupils: Pupils are equal, round, and reactive to light.  Cardiovascular:     Rate and Rhythm: Normal rate.  Pulmonary:     Effort: Pulmonary effort is normal. No respiratory distress.  Abdominal:     General: There is no distension.     Palpations: Abdomen is soft.  Musculoskeletal:        General: Normal range of motion.     Cervical back: Normal range of motion.  Skin:    General: Skin is warm and dry.     Findings: Erythema present.     Comments: There is a tattoo on the center of the right forearm, palmar surface approximately 4 cm across.  It does have erythema tenderness and some swelling around the art work.  No purulence.  No adenopathy  Neurological:     Mental Status: He is alert.     UC Treatments / Results  Labs (all labs ordered are  listed, but only abnormal  results are displayed) Labs Reviewed - No data to display  EKG   Radiology No results found.  Procedures Procedures (including critical care time)  Medications Ordered in UC Medications - No data to display  Initial Impression / Assessment and Plan / UC Course  I have reviewed the triage vital signs and the nursing notes.  Pertinent labs & imaging results that were available during my care of the patient were reviewed by me and considered in my medical decision making (see chart for details).     We will treat with doxycycline.  Patient is allergic to sulfa antibiotics.  I will cover him for MRSA.  Warm compresses.  Wound care discussed Final Clinical Impressions(s) / UC Diagnoses   Final diagnoses:  Cellulitis of left upper extremity     Discharge Instructions      Continue to wash daily and apply ointment Take the doxycycline 2 x a day with food Continue your daily probiotic    ED Prescriptions     Medication Sig Dispense Auth. Provider   doxycycline (VIBRAMYCIN) 100 MG capsule Take 1 capsule (100 mg total) by mouth 2 (two) times daily. 20 capsule Raylene Everts, MD      PDMP not reviewed this encounter.   Raylene Everts, MD 03/02/21 [phone]

## 2021-03-02 NOTE — Discharge Instructions (Addendum)
Continue to wash daily and apply ointment Take the doxycycline 2 x a day with food Continue your daily probiotic

## 2021-03-02 NOTE — ED Triage Notes (Signed)
Patient got a tattoo on right forearm last Saturday, area is now red and swollen.  Patient has been applying cold compresses. Area is very painful.  Patient is unable to take OTC pain meds.

## 2021-03-06 DIAGNOSIS — F4320 Adjustment disorder, unspecified: Secondary | ICD-10-CM | POA: Diagnosis not present

## 2021-03-06 DIAGNOSIS — Z630 Problems in relationship with spouse or partner: Secondary | ICD-10-CM | POA: Diagnosis not present

## 2021-03-07 ENCOUNTER — Encounter: Payer: Self-pay | Admitting: Osteopathic Medicine

## 2021-03-07 ENCOUNTER — Ambulatory Visit: Payer: BC Managed Care – PPO | Admitting: Osteopathic Medicine

## 2021-03-07 ENCOUNTER — Other Ambulatory Visit: Payer: Self-pay

## 2021-03-07 VITALS — BP 120/74 | HR 92 | Ht 68.0 in | Wt 187.0 lb

## 2021-03-07 DIAGNOSIS — T3695XA Adverse effect of unspecified systemic antibiotic, initial encounter: Secondary | ICD-10-CM

## 2021-03-07 DIAGNOSIS — B370 Candidal stomatitis: Secondary | ICD-10-CM

## 2021-03-07 MED ORDER — FLUCONAZOLE 150 MG PO TABS: 150.0000 mg | ORAL_TABLET | Freq: Once | ORAL | 1 refills | Status: AC

## 2021-03-07 MED ORDER — MAGIC MOUTHWASH W/LIDOCAINE: 15.0000 mL | Freq: Four times a day (QID) | ORAL | 11 refills | Status: DC | PRN

## 2021-03-07 MED ORDER — NYSTATIN 100000 UNIT/ML MT SUSP: 500000.0000 [IU] | Freq: Four times a day (QID) | OROMUCOSAL | 0 refills | Status: DC

## 2021-03-07 NOTE — Progress Notes (Signed)
Brian Spears is a 44 y.o. male who presents to  [place] at [facility]  today, 03/07/21, seeking care for the following:  Concern for thrush - initially started about a month ago w/ bega spray (nasal/sinus spray - I looked up a BEG spray which is mupirocin), he was also recently treated for cellulitis LUE 03/02/21 (5 days ago) in urgent care for infection of recent tattoo. Rx doxycycline  100 mg bid x10 days. 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 Pt also requests Rx for Diflucan for possible yeast infection which he can get w/ antibiotics      ASSESSMENT & PLAN with other pertinent findings:  The primary encounter diagnosis was Oral thrush. A diagnosis of Antibiotic causing adverse effect was also pertinent to this visit.    Patient Instructions  Finish doxycycline for skin infection Nystatin sent for thrush Diflucan sent for yeast infection if this develops in groin/armpits  Rx printed for Magic Mouthwash if Nysatin not helping - may need to take this to compounding pharmacy   No orders of the defined types were placed in this encounter.   Meds ordered this encounter  Medications   fluconazole (DIFLUCAN) 150 MG tablet    Sig: Take 1 tablet (150 mg total) by mouth once for 1 dose. Repeat dose 72 hours if yeast infection persists    Dispense:  2 tablet    Refill:  1   nystatin (MYCOSTATIN) 100000 UNIT/ML suspension    Sig: Take 5 mLs (500,000 Units total) by mouth 4 (four) times daily. Swish for 30 seconds and spit out.    Dispense:  180 mL    Refill:  0   magic mouthwash w/lidocaine SOLN    Sig: Take 15 mLs by mouth 4 (four) times daily as needed for mouth pain. Swish, gargle and spit.    Dispense:  500 mL    Refill:  11    Viscous lidocaine 2% 150 mL, Benadryl (12.4m/5mL) 20 mL, hydrocortisone 100  mg, nystatin 20 mL.     See below for relevant physical exam findings  See below for recent lab and imaging results reviewed  Medications, allergies, PMH, PSH, SocH, FamH reviewed below    Follow-up instructions: Return if symptoms worsen or fail to improve.                                        Exam:  BP 120/74   Pulse 92   Ht 5' 8"  (1.727 m)   Wt 187 lb (84.8 kg)   SpO2 96%   BMI 28.43 kg/m  Constitutional: VS see above. General Appearance: alert, well-developed, well-nourished, NAD Neck: No masses, trachea midline.  Oropharynx & Mouth: SUPERFICIAL WHITISH FILM ON POSTERIOR TONGUE, NO ULCERATION, NO EXUDATE  Respiratory: Normal respiratory effort. no wheeze, no rhonchi, no rales Cardiovascular: S1/S2 normal, no murmur, no rub/gallop auscultated. RRR.  Musculoskeletal: Gait normal. Symmetric and independent movement of all extremities Abdominal: non-tender, non-distended, no appreciable organomegaly, neg Murphy's, BS WNLx4 Neurological: Normal balance/coordination. No tremor. Skin: warm, dry, intact.  Psychiatric: Normal judgment/insight. Normal mood and affect. Oriented x3.   Current Meds  Medication Sig   fluconazole (DIFLUCAN) 150 MG tablet Take 1 tablet (150 mg total) by mouth once for 1 dose. Repeat dose 72 hours  if yeast infection persists   magic mouthwash w/lidocaine SOLN Take 15 mLs by mouth 4 (four) times daily as needed for mouth pain. Swish, gargle and spit.   nystatin (MYCOSTATIN) 100000 UNIT/ML suspension Take 5 mLs (500,000 Units total) by mouth 4 (four) times daily. Swish for 30 seconds and spit out.    Allergies  Allergen Reactions   Sulfa Antibiotics Other (See Comments)    headaches   Egg White (Diagnostic)    Gluten Meal     GI issues   Naproxen Sodium Swelling   Peanut-Containing Drug Products    Salmon [Fish Oil]    Barley Grass Dermatitis, Diarrhea, Nausea And Vomiting and Rash   Corn-Containing Products  Dermatitis, Diarrhea, Nausea And Vomiting and Rash   Crab (Diagnostic) Dermatitis, Diarrhea, Nausea And Vomiting and Rash   Oat Grain (Diagnostic) Dermatitis, Diarrhea, Nausea And Vomiting and Rash   Other Dermatitis, Nausea And Vomiting, Rash and Diarrhea   Shellfish Allergy Dermatitis, Diarrhea, Nausea And Vomiting and Rash   Shrimp (Diagnostic) Dermatitis, Diarrhea, Itching and Rash    Patient Active Problem List   Diagnosis Date Noted   COVID-19 09/17/2020   History of Lyme disease 03/25/2019   Chronic migraine 03/10/2019   Generalized anxiety manifesting as headache 01/18/2019   Lateral epicondylitis, right elbow 12/15/2018   Plantar fasciitis, left 03/05/2018   Right shoulder pain 07/02/2017   Tinnitus, right 06/09/2016   Medial epicondylitis of right elbow 02/18/2016   Onychodystrophy 12/05/2014   Primary osteoarthritis of both elbows 12/05/2014   Elevated hemoglobin (HCC) 08/01/2013   Male hypogonadism 06/01/2013   GERD (gastroesophageal reflux disease) 03/01/2013   History of smoking 06/07/2012   Annual physical exam 06/07/2012   Celiac disease 06/30/2011   Hyperlipidemia 09/27/2010   Bilateral carpal tunnel syndrome 09/27/2010    Family History  Problem Relation Age of Onset   Breast cancer Mother    Hyperlipidemia Mother    Hypertension Father    Hyperlipidemia Father    Other Other        grandfather with alcoholism   Heart attack Other        grandmother   Diabetes Other        grandmother   Colon cancer Other        great uncles on Dads side   Breast cancer Other        aunt   Lung cancer Other        grandmother   Prostate cancer Other        grandfather   Melanoma Other        uncle   Esophageal cancer Neg Hx    Rectal cancer Neg Hx    Stomach cancer Neg Hx     Social History   Tobacco Use  Smoking Status Former   Packs/day: 0.20   Pack years: 0.00   Types: Cigarettes  Smokeless Tobacco Never  Tobacco Comments   started smoking at age  69, started smokeless tobacco at age 50    Past Surgical History:  Procedure Laterality Date   surgery on right hand Bilateral     Immunization History  Administered Date(s) Administered   Influenza Inj Mdck Quad Pf 05/05/2018   Influenza Split 06/07/2012   Influenza,inj,Quad PF,6+ Mos 06/01/2013, 06/01/2014, 09/17/2015, 06/01/2017, 04/16/2019   Influenza-Unspecified 06/07/2016, 05/16/2017, 04/16/2019   Tdap 06/07/2012    Recent Results (from the past 2160 hour(s))  CBC     Status: Abnormal   Collection Time: 12/28/20 12:00 AM  Result Value Ref Range   WBC 6.9 3.8 - 10.8 Thousand/uL   RBC 6.11 (H) 4.20 - 5.80 Million/uL   Hemoglobin 17.0 13.2 - 17.1 g/dL   HCT 52.5 (H) 38.5 - 50.0 %   MCV 85.9 80.0 - 100.0 fL   MCH 27.8 27.0 - 33.0 pg   MCHC 32.4 32.0 - 36.0 g/dL   RDW 12.1 11.0 - 15.0 %   Platelets 273 140 - 400 Thousand/uL   MPV 11.0 7.5 - 12.5 fL  Clostridium difficile culture-fecal     Status: None   Collection Time: 12/31/20 12:00 AM   Specimen: Stool  Result Value Ref Range   Result-CDIFCU see note     Comment: CDIFF CULT W/RFLX TO TOX C. DIFFICILE CULTURE SOURCE : STOOL  RESULT/COMMENT: . No Clostridium difficile isolated. . . For additional information, please refer to http://education.[facility].com/faq/FAQ136 (This link is being provided for informational/ educational purposes only.) . C DIFF TOX B QL PCR C DIFF TOX B QL PCR             Not indicated     No results found.     All questions at time of visit were answered - patient instructed to contact office with any additional concerns or updates. ER/RTC precautions were reviewed with the patient as applicable.   Please note: manual typing as well as voice recognition software may have been used to produce this document - typos may escape review. Please contact Dr. Sheppard Coil for any needed clarifications.

## 2021-03-07 NOTE — Patient Instructions (Signed)
Finish doxycycline for skin infection Nystatin sent for thrush Diflucan sent for yeast infection if this develops in groin/armpits  Rx printed for Magic Mouthwash if Nysatin not helping - may need to take this to compounding pharmacy

## 2021-03-15 DIAGNOSIS — Z9189 Other specified personal risk factors, not elsewhere classified: Secondary | ICD-10-CM | POA: Diagnosis not present

## 2021-03-15 DIAGNOSIS — E612 Magnesium deficiency: Secondary | ICD-10-CM | POA: Diagnosis not present

## 2021-03-15 DIAGNOSIS — R5383 Other fatigue: Secondary | ICD-10-CM | POA: Diagnosis not present

## 2021-03-15 DIAGNOSIS — K900 Celiac disease: Secondary | ICD-10-CM | POA: Diagnosis not present

## 2021-03-20 DIAGNOSIS — Z630 Problems in relationship with spouse or partner: Secondary | ICD-10-CM | POA: Diagnosis not present

## 2021-03-20 DIAGNOSIS — F4320 Adjustment disorder, unspecified: Secondary | ICD-10-CM | POA: Diagnosis not present

## 2021-03-27 DIAGNOSIS — Z630 Problems in relationship with spouse or partner: Secondary | ICD-10-CM | POA: Diagnosis not present

## 2021-03-27 DIAGNOSIS — F4320 Adjustment disorder, unspecified: Secondary | ICD-10-CM | POA: Diagnosis not present

## 2021-04-10 ENCOUNTER — Other Ambulatory Visit: Payer: Self-pay | Admitting: Osteopathic Medicine

## 2021-04-10 NOTE — Telephone Encounter (Signed)
Definitely not supposed to be on this forever, if he is still having symptoms we need to consider another etiology.  I will go ahead and deny the prescription.

## 2021-04-10 NOTE — Telephone Encounter (Signed)
Pt was given this medication for oral thrush on 03/07/21.  Is he supposed to continue on this medication indefinitely?  Please review and refill if appropriate.  Charyl Bigger, CMA

## 2021-04-11 ENCOUNTER — Other Ambulatory Visit: Payer: Self-pay

## 2021-04-11 ENCOUNTER — Emergency Department
Admission: EM | Admit: 2021-04-11 | Discharge: 2021-04-11 | Disposition: A | Discharge status: Home / Self Care | Payer: BC Managed Care – PPO | Source: Home / Self Care

## 2021-04-11 ENCOUNTER — Encounter: Payer: Self-pay | Admitting: Emergency Medicine

## 2021-04-11 DIAGNOSIS — B370 Candidal stomatitis: Secondary | ICD-10-CM

## 2021-04-11 MED ORDER — NYSTATIN 100000 UNIT/ML MT SUSP: 500000.0000 [IU] | Freq: Four times a day (QID) | OROMUCOSAL | 0 refills | Status: DC

## 2021-04-11 MED ORDER — FLUCONAZOLE 150 MG PO TABS: 150.0000 mg | ORAL_TABLET | Freq: Every day | ORAL | 0 refills | Status: DC

## 2021-04-11 NOTE — Discharge Instructions (Addendum)
I refilled the nystatin swish and the Diflucan Follow-up with your primary care

## 2021-04-11 NOTE — ED Provider Notes (Signed)
Brian Spears C[facility]    CSN: [id number] Arrival date & time: 04/11/21  1245      History   Chief Complaint Chief Complaint  Patient presents with   Brian Spears    HPI Brian Spears is a 45 y.o. male.   HPI  Patient is using a product from his holistic provider at [place] that he thinks is causing oral thrush.  He was treated for this a month ago.  It has come back.  He has decided to stop the spray.  He is wishing refill of his nystatin.  He was unable to reach his usual PCP to get this done today.  Past Medical History:  Diagnosis Date   Anxiety    Arthritis    Celiac disease    Hand fracture    Headache    Hyperlipidemia    Lyme disease    Migraines    Normal cardiac stress test 4/10   at [facility] - negative.  terminated for fatigue    Patient Active Problem List   Diagnosis Date Noted   COVID-19 09/17/2020   History of Lyme disease 03/25/2019   Chronic migraine 03/10/2019   Generalized anxiety manifesting as headache 01/18/2019   Lateral epicondylitis, right elbow 12/15/2018   Plantar fasciitis, left 03/05/2018   Right shoulder pain 07/02/2017   Tinnitus, right 06/09/2016   Medial epicondylitis of right elbow 02/18/2016   Onychodystrophy 12/05/2014   Primary osteoarthritis of both elbows 12/05/2014   Elevated hemoglobin ([place]) 08/01/2013   Male hypogonadism 06/01/2013   GERD (gastroesophageal reflux disease) 03/01/2013   History of smoking 06/07/2012   Annual physical exam 06/07/2012   Celiac disease 06/30/2011   Hyperlipidemia 09/27/2010   Bilateral carpal tunnel syndrome 09/27/2010    Past Surgical History:  Procedure Laterality Date   surgery on right hand Bilateral        Home Medications    Prior to Admission medications   Medication Sig Start Date End Date Taking? Authorizing Provider  fluconazole (DIFLUCAN) 150 MG tablet Take 1 tablet (150 mg total) by mouth daily. Repeat in 1 week if needed 04/11/21  Yes Raylene Everts, MD  busPIRone  (BUSPAR) 5 MG tablet TAKE 1 TABLET BY MOUTH THREE TIMES A DAY 01/29/21   Silverio Decamp, MD  magic mouthwash w/lidocaine SOLN Take 15 mLs by mouth 4 (four) times daily as needed for mouth pain. Swish, gargle and spit. 03/07/21   Emeterio Reeve, DO  Multiple Vitamins-Minerals (ZINC PO) Take by mouth daily.    [provider]  nystatin (MYCOSTATIN) 100000 UNIT/ML suspension Take 5 mLs (500,000 Units total) by mouth 4 (four) times daily. Swish for 30 seconds and spit out. 04/11/21   Raylene Everts, MD  OMEGA-3 FATTY ACIDS PO Take by mouth.    [provider]  testosterone cypionate (DEPOTESTOSTERONE CYPIONATE) 200 MG/ML injection SMARTSIG:0.5 Milliliter(s) IM Once a Week 02/05/21   [provider]  triamcinolone cream (KENALOG) 0.5 % Apply 1 application topically 2 (two) times daily. To affected areas. Patient taking differently: Apply 1 application topically 2 (two) times daily as needed. To affected areas. 08/31/17   Silverio Decamp, MD    Family History Family History  Problem Relation Age of Onset   Breast cancer Mother    Hyperlipidemia Mother    Hypertension Father    Hyperlipidemia Father    Other Other        grandfather with alcoholism   Heart attack Other  grandmother   Diabetes Other        grandmother   Colon cancer Other        great uncles on Dads side   Breast cancer Other        aunt   Lung cancer Other        grandmother   Prostate cancer Other        grandfather   Melanoma Other        uncle   Esophageal cancer Neg Hx    Rectal cancer Neg Hx    Stomach cancer Neg Hx     Social History Social History   Tobacco Use   Smoking status: Former    Packs/day: 0.20    Types: Cigarettes   Smokeless tobacco: Never   Tobacco comments:    started smoking at age 69, started smokeless tobacco at age 4  Vaping Use   Vaping Use: Some days  Substance Use Topics   Alcohol use: No   Drug use: No     Allergies    Sulfa antibiotics, Egg white (diagnostic), Gluten meal, Naproxen sodium, Peanut-containing drug products, Salmon [fish oil], Barley grass, Corn-containing products, Crab (diagnostic), Oat grain (diagnostic), Other, Shellfish allergy, and Shrimp (diagnostic)   Review of Systems Review of Systems See HPI  Physical Exam Triage Vital Signs ED Triage Vitals  Enc Vitals Group     BP 04/11/21 1256 121/77     Pulse Rate 04/11/21 1256 75     Resp --      Temp 04/11/21 1256 98.2 F (36.8 C)     Temp Source 04/11/21 1256 Oral     SpO2 04/11/21 1256 97 %     Weight 04/11/21 1259 180 lb (81.6 kg)     Height 04/11/21 1259 5' 8"  (1.727 m)     Head Circumference --      Peak Flow --      Pain Score 04/11/21 1258 4     Pain Loc --      Pain Edu? --      Excl. in [place]? --    No data found.  Updated Vital Signs BP 121/77 (BP Location: Left Arm)   Pulse 75   Temp 98.2 F (36.8 C) (Oral)   Ht 5' 8"  (1.727 m)   Wt 81.6 kg   SpO2 97%   BMI 27.37 kg/m      Physical Exam Constitutional:      General: He is not in acute distress.    Appearance: Normal appearance. He is well-developed.  HENT:     Head: Normocephalic and atraumatic.     Right Ear: Tympanic membrane and ear canal normal.     Left Ear: Tympanic membrane and ear canal normal.     Nose: Nose normal. No congestion.     Mouth/Throat:     Mouth: Mucous membranes are moist.     Pharynx: Posterior oropharyngeal erythema present.     Comments: Posterior pharynx has moderate erythema.  No white plaque or discharge. Eyes:     Conjunctiva/sclera: Conjunctivae normal.     Pupils: Pupils are equal, round, and reactive to light.  Cardiovascular:     Rate and Rhythm: Normal rate.  Pulmonary:     Effort: Pulmonary effort is normal. No respiratory distress.  Abdominal:     General: There is no distension.     Palpations: Abdomen is soft.  Musculoskeletal:        General: Normal range of motion.  Cervical back: Normal range of  motion.  Skin:    General: Skin is warm and dry.  Neurological:     Mental Status: He is alert.  Psychiatric:        Mood and Affect: Mood normal.     UC Treatments / Results  Labs (all labs ordered are listed, but only abnormal results are displayed) Labs Reviewed - No data to display  EKG   Radiology No results found.  Procedures Procedures (including critical care time)  Medications Ordered in UC Medications - No data to display  Initial Impression / Assessment and Plan / UC Course  I have reviewed the triage vital signs and the nursing notes.  Pertinent labs & imaging results that were available during my care of the patient were reviewed by me and considered in my medical decision making (see chart for details).     Patient has discomfort that he associates with thrush.  He was just treated for the same.  We will refill this medication.  Follow-up with PCP Final Clinical Impressions(s) / UC Diagnoses   Final diagnoses:  Oral thrush     Discharge Instructions      I refilled the nystatin swish and the Diflucan Follow-up with your primary care   ED Prescriptions     Medication Sig Dispense Auth. Provider   nystatin (MYCOSTATIN) 100000 UNIT/ML suspension Take 5 mLs (500,000 Units total) by mouth 4 (four) times daily. Swish for 30 seconds and spit out. 180 mL Raylene Everts, MD   fluconazole (DIFLUCAN) 150 MG tablet Take 1 tablet (150 mg total) by mouth daily. Repeat in 1 week if needed 2 tablet Raylene Everts, MD      PDMP not reviewed this encounter.   Raylene Everts, MD 04/11/21 1323

## 2021-04-11 NOTE — ED Triage Notes (Signed)
Patient c/o thrush in his mouth x 5 days, tongue is nasty, bilateral ear pain.

## 2021-04-24 DIAGNOSIS — Z630 Problems in relationship with spouse or partner: Secondary | ICD-10-CM | POA: Diagnosis not present

## 2021-04-24 DIAGNOSIS — F4320 Adjustment disorder, unspecified: Secondary | ICD-10-CM | POA: Diagnosis not present

## 2021-05-01 DIAGNOSIS — Z630 Problems in relationship with spouse or partner: Secondary | ICD-10-CM | POA: Diagnosis not present

## 2021-05-01 DIAGNOSIS — F4320 Adjustment disorder, unspecified: Secondary | ICD-10-CM | POA: Diagnosis not present

## 2021-05-02 ENCOUNTER — Other Ambulatory Visit: Payer: Self-pay

## 2021-05-02 ENCOUNTER — Ambulatory Visit (INDEPENDENT_AMBULATORY_CARE_PROVIDER_SITE_OTHER): Payer: BC Managed Care – PPO

## 2021-05-02 ENCOUNTER — Ambulatory Visit: Payer: BC Managed Care – PPO | Admitting: Sports Medicine

## 2021-05-02 DIAGNOSIS — M19022 Primary osteoarthritis, left elbow: Secondary | ICD-10-CM

## 2021-05-02 DIAGNOSIS — M25511 Pain in right shoulder: Secondary | ICD-10-CM

## 2021-05-02 DIAGNOSIS — M19021 Primary osteoarthritis, right elbow: Secondary | ICD-10-CM

## 2021-05-02 DIAGNOSIS — G8929 Other chronic pain: Secondary | ICD-10-CM

## 2021-05-02 NOTE — Assessment & Plan Note (Signed)
Increasing pain right elbow, repeated injection today, last done September 2021.  Chronic process with exacerbation and pharmacologic intervention.

## 2021-05-02 NOTE — Progress Notes (Signed)
    Procedures performed today:    Procedure: Real-time Ultrasound Guided injection of the right elbow joint Device: Samsung HS60  Verbal informed consent obtained.  Time-out conducted.  Noted no overlying erythema, induration, or other signs of local infection.  Skin prepped in a sterile fashion.  Local anesthesia: Topical Ethyl chloride.  With sterile technique and under real time ultrasound guidance: Noted arthritic joint, 1 cc Kenalog 40, 1 cc lidocaine, 1 cc bupivacaine injected easily Completed without difficulty  Advised to call if fevers/chills, erythema, induration, drainage, or persistent bleeding.  Images permanently stored and available for review in PACS.  Impression: Technically successful ultrasound guided injection.  Procedure: Real-time Ultrasound Guided injection of the right subacromial bursa Device: Samsung HS60  Verbal informed consent obtained.  Time-out conducted.  Noted no overlying erythema, induration, or other signs of local infection.  Skin prepped in a sterile fashion.  Local anesthesia: Topical Ethyl chloride.  With sterile technique and under real time ultrasound guidance: Noted intact rotator cuff, 1 cc Kenalog 40, 1 cc lidocaine, 1 cc bupivacaine injected easily Completed without difficulty  Advised to call if fevers/chills, erythema, induration, drainage, or persistent bleeding.  Images permanently stored and available for review in PACS.  Impression: Technically successful ultrasound guided injection.  Independent interpretation of notes and tests performed by another provider:   None.  Brief History, Exam, Impression, and Recommendations:    Primary osteoarthritis of both elbows Increasing pain right elbow, repeated injection today, last done September 2021.  Chronic process with exacerbation and pharmacologic intervention.  Right shoulder pain Legrand has a several month history of right shoulder pain, localized over the deltoid worse with  abduction, positive Neer's, Hawkins, empty can signs, no rotator cuff weakness. We did an ultrasound-guided injection, rotator cuff was intact. Home conditioning exercises given, x-rays, return to see me in 6 weeks as needed.    ___________________________________________ Gwen Her. Dianah Field, M.D., ABFM., CAQSM. Primary Care and [place] Instructor of [place] of [facility] of Medicine

## 2021-05-02 NOTE — Assessment & Plan Note (Signed)
[place] has a several month history of right shoulder pain, localized over the deltoid worse with abduction, positive Neer's, Hawkins, empty can signs, no rotator cuff weakness. We did an ultrasound-guided injection, rotator cuff was intact. Home conditioning exercises given, x-rays, return to see me in 6 weeks as needed.

## 2021-05-08 DIAGNOSIS — F4320 Adjustment disorder, unspecified: Secondary | ICD-10-CM | POA: Diagnosis not present

## 2021-05-08 DIAGNOSIS — Z630 Problems in relationship with spouse or partner: Secondary | ICD-10-CM | POA: Diagnosis not present

## 2021-05-15 DIAGNOSIS — F4320 Adjustment disorder, unspecified: Secondary | ICD-10-CM | POA: Diagnosis not present

## 2021-05-15 DIAGNOSIS — Z630 Problems in relationship with spouse or partner: Secondary | ICD-10-CM | POA: Diagnosis not present

## 2021-05-20 ENCOUNTER — Other Ambulatory Visit: Payer: Self-pay

## 2021-05-20 ENCOUNTER — Ambulatory Visit: Payer: BC Managed Care – PPO | Admitting: Sports Medicine

## 2021-05-20 DIAGNOSIS — M25511 Pain in right shoulder: Secondary | ICD-10-CM | POA: Diagnosis not present

## 2021-05-20 DIAGNOSIS — G8929 Other chronic pain: Secondary | ICD-10-CM

## 2021-05-20 DIAGNOSIS — M19022 Primary osteoarthritis, left elbow: Secondary | ICD-10-CM | POA: Diagnosis not present

## 2021-05-20 DIAGNOSIS — M19021 Primary osteoarthritis, right elbow: Secondary | ICD-10-CM | POA: Diagnosis not present

## 2021-05-20 DIAGNOSIS — F411 Generalized anxiety disorder: Secondary | ICD-10-CM

## 2021-05-20 NOTE — Assessment & Plan Note (Signed)
And had come off of his anxiety medicines and the anxiety increased not surprisingly, he is also going through a separation/divorce. We restarted his BuSpar, he is currently doing it 5 mg twice daily rather than 3 times daily and this seems to work well, he does understand that until the separation/divorce proceedings are complete he will probably continue to have some degree of baseline anxiety. At this juncture things are controlled adequately, return as needed.

## 2021-05-20 NOTE — Assessment & Plan Note (Signed)
Impingement symptoms, subacromial injection at the last visit has resolved his symptoms, continue conditioning.

## 2021-05-20 NOTE — Progress Notes (Signed)
    Procedures performed today:    None.  Independent interpretation of notes and tests performed by another provider:   None.  Brief History, Exam, Impression, and Recommendations:    Primary osteoarthritis of both elbows At the last visit and was having increasing pain in his right elbow, we did a elbow joint injection and he returns today pain-free.  Right shoulder pain Impingement symptoms, subacromial injection at the last visit has resolved his symptoms, continue conditioning.  Generalized anxiety manifesting as headache And had come off of his anxiety medicines and the anxiety increased not surprisingly, he is also going through a separation/divorce. We restarted his BuSpar, he is currently doing it 5 mg twice daily rather than 3 times daily and this seems to work well, he does understand that until the separation/divorce proceedings are complete he will probably continue to have some degree of baseline anxiety. At this juncture things are controlled adequately, return as needed.    ___________________________________________ Gwen Her. Dianah Field, M.D., ABFM., CAQSM. Primary Care and [place] Instructor of [place] of [facility] of Medicine

## 2021-05-20 NOTE — Assessment & Plan Note (Signed)
At the last visit and was having increasing pain in his right elbow, we did a elbow joint injection and he returns today pain-free.

## 2021-05-22 DIAGNOSIS — F4320 Adjustment disorder, unspecified: Secondary | ICD-10-CM | POA: Diagnosis not present

## 2021-05-22 DIAGNOSIS — Z630 Problems in relationship with spouse or partner: Secondary | ICD-10-CM | POA: Diagnosis not present

## 2021-05-29 DIAGNOSIS — Z630 Problems in relationship with spouse or partner: Secondary | ICD-10-CM | POA: Diagnosis not present

## 2021-05-29 DIAGNOSIS — F4320 Adjustment disorder, unspecified: Secondary | ICD-10-CM | POA: Diagnosis not present

## 2021-06-05 DIAGNOSIS — Z630 Problems in relationship with spouse or partner: Secondary | ICD-10-CM | POA: Diagnosis not present

## 2021-06-05 DIAGNOSIS — F4320 Adjustment disorder, unspecified: Secondary | ICD-10-CM | POA: Diagnosis not present

## 2021-06-12 DIAGNOSIS — Z630 Problems in relationship with spouse or partner: Secondary | ICD-10-CM | POA: Diagnosis not present

## 2021-06-12 DIAGNOSIS — F4320 Adjustment disorder, unspecified: Secondary | ICD-10-CM | POA: Diagnosis not present

## 2021-06-19 DIAGNOSIS — Z630 Problems in relationship with spouse or partner: Secondary | ICD-10-CM | POA: Diagnosis not present

## 2021-06-19 DIAGNOSIS — F4320 Adjustment disorder, unspecified: Secondary | ICD-10-CM | POA: Diagnosis not present

## 2021-06-26 DIAGNOSIS — F4320 Adjustment disorder, unspecified: Secondary | ICD-10-CM | POA: Diagnosis not present

## 2021-06-26 DIAGNOSIS — Z630 Problems in relationship with spouse or partner: Secondary | ICD-10-CM | POA: Diagnosis not present

## 2021-07-03 DIAGNOSIS — M2550 Pain in unspecified joint: Secondary | ICD-10-CM | POA: Diagnosis not present

## 2021-07-03 DIAGNOSIS — E612 Magnesium deficiency: Secondary | ICD-10-CM | POA: Diagnosis not present

## 2021-07-03 DIAGNOSIS — Z9189 Other specified personal risk factors, not elsewhere classified: Secondary | ICD-10-CM | POA: Diagnosis not present

## 2021-07-03 DIAGNOSIS — F4320 Adjustment disorder, unspecified: Secondary | ICD-10-CM | POA: Diagnosis not present

## 2021-07-03 DIAGNOSIS — K900 Celiac disease: Secondary | ICD-10-CM | POA: Diagnosis not present

## 2021-07-03 DIAGNOSIS — Z630 Problems in relationship with spouse or partner: Secondary | ICD-10-CM | POA: Diagnosis not present

## 2021-07-10 DIAGNOSIS — F4320 Adjustment disorder, unspecified: Secondary | ICD-10-CM | POA: Diagnosis not present

## 2021-07-10 DIAGNOSIS — Z630 Problems in relationship with spouse or partner: Secondary | ICD-10-CM | POA: Diagnosis not present

## 2021-07-17 DIAGNOSIS — Z630 Problems in relationship with spouse or partner: Secondary | ICD-10-CM | POA: Diagnosis not present

## 2021-07-17 DIAGNOSIS — F4320 Adjustment disorder, unspecified: Secondary | ICD-10-CM | POA: Diagnosis not present

## 2021-07-24 DIAGNOSIS — F4320 Adjustment disorder, unspecified: Secondary | ICD-10-CM | POA: Diagnosis not present

## 2021-07-24 DIAGNOSIS — Z630 Problems in relationship with spouse or partner: Secondary | ICD-10-CM | POA: Diagnosis not present

## 2021-07-31 DIAGNOSIS — F4320 Adjustment disorder, unspecified: Secondary | ICD-10-CM | POA: Diagnosis not present

## 2021-07-31 DIAGNOSIS — Z630 Problems in relationship with spouse or partner: Secondary | ICD-10-CM | POA: Diagnosis not present

## 2021-08-07 DIAGNOSIS — E039 Hypothyroidism, unspecified: Secondary | ICD-10-CM | POA: Diagnosis not present

## 2021-08-07 DIAGNOSIS — E612 Magnesium deficiency: Secondary | ICD-10-CM | POA: Diagnosis not present

## 2021-08-07 DIAGNOSIS — E291 Testicular hypofunction: Secondary | ICD-10-CM | POA: Diagnosis not present

## 2021-08-07 DIAGNOSIS — F4320 Adjustment disorder, unspecified: Secondary | ICD-10-CM | POA: Diagnosis not present

## 2021-08-07 DIAGNOSIS — R5383 Other fatigue: Secondary | ICD-10-CM | POA: Diagnosis not present

## 2021-08-07 DIAGNOSIS — Z630 Problems in relationship with spouse or partner: Secondary | ICD-10-CM | POA: Diagnosis not present

## 2021-08-07 DIAGNOSIS — E559 Vitamin D deficiency, unspecified: Secondary | ICD-10-CM | POA: Diagnosis not present

## 2021-08-07 DIAGNOSIS — R7303 Prediabetes: Secondary | ICD-10-CM | POA: Diagnosis not present

## 2021-08-07 DIAGNOSIS — E538 Deficiency of other specified B group vitamins: Secondary | ICD-10-CM | POA: Diagnosis not present

## 2021-08-14 DIAGNOSIS — F4320 Adjustment disorder, unspecified: Secondary | ICD-10-CM | POA: Diagnosis not present

## 2021-08-14 DIAGNOSIS — Z630 Problems in relationship with spouse or partner: Secondary | ICD-10-CM | POA: Diagnosis not present

## 2021-08-21 DIAGNOSIS — F4320 Adjustment disorder, unspecified: Secondary | ICD-10-CM | POA: Diagnosis not present

## 2021-08-21 DIAGNOSIS — Z630 Problems in relationship with spouse or partner: Secondary | ICD-10-CM | POA: Diagnosis not present

## 2021-08-28 DIAGNOSIS — Z630 Problems in relationship with spouse or partner: Secondary | ICD-10-CM | POA: Diagnosis not present

## 2021-08-28 DIAGNOSIS — F4320 Adjustment disorder, unspecified: Secondary | ICD-10-CM | POA: Diagnosis not present

## 2021-09-02 DIAGNOSIS — E612 Magnesium deficiency: Secondary | ICD-10-CM | POA: Diagnosis not present

## 2021-09-02 DIAGNOSIS — Z9189 Other specified personal risk factors, not elsewhere classified: Secondary | ICD-10-CM | POA: Diagnosis not present

## 2021-09-02 DIAGNOSIS — M2550 Pain in unspecified joint: Secondary | ICD-10-CM | POA: Diagnosis not present

## 2021-09-02 DIAGNOSIS — K900 Celiac disease: Secondary | ICD-10-CM | POA: Diagnosis not present

## 2021-09-04 DIAGNOSIS — Z630 Problems in relationship with spouse or partner: Secondary | ICD-10-CM | POA: Diagnosis not present

## 2021-09-04 DIAGNOSIS — F4320 Adjustment disorder, unspecified: Secondary | ICD-10-CM | POA: Diagnosis not present

## 2021-09-05 ENCOUNTER — Encounter: Payer: Self-pay | Admitting: Sports Medicine

## 2021-09-06 ENCOUNTER — Telehealth: Payer: BC Managed Care – PPO | Admitting: Physician Assistant

## 2021-09-06 DIAGNOSIS — B9689 Other specified bacterial agents as the cause of diseases classified elsewhere: Secondary | ICD-10-CM

## 2021-09-06 DIAGNOSIS — T3695XA Adverse effect of unspecified systemic antibiotic, initial encounter: Secondary | ICD-10-CM

## 2021-09-06 DIAGNOSIS — H66001 Acute suppurative otitis media without spontaneous rupture of ear drum, right ear: Secondary | ICD-10-CM

## 2021-09-06 DIAGNOSIS — J019 Acute sinusitis, unspecified: Secondary | ICD-10-CM | POA: Diagnosis not present

## 2021-09-06 DIAGNOSIS — B379 Candidiasis, unspecified: Secondary | ICD-10-CM

## 2021-09-06 MED ORDER — NYSTATIN 100000 UNIT/ML MT SUSP: 500000.0000 [IU] | Freq: Four times a day (QID) | OROMUCOSAL | 0 refills | Status: DC

## 2021-09-06 MED ORDER — AMOXICILLIN-POT CLAVULANATE 500-125 MG PO TABS: 1.0000 | ORAL_TABLET | Freq: Three times a day (TID) | ORAL | 0 refills | Status: DC

## 2021-09-06 NOTE — Patient Instructions (Addendum)
Brian Spears, thank you for joining Mar Daring, PA-C for today's virtual visit.  While this provider is not your primary care provider (PCP), if your PCP is located in our provider database this encounter information will be shared with them immediately following your visit.  Consent: (Patient) Brian Spears provided verbal consent for this virtual visit at the beginning of the encounter.  Current Medications:  Current Outpatient Medications:    amoxicillin-clavulanate (AUGMENTIN) 500-125 MG tablet, Take 1 tablet (500 mg total) by mouth 3 (three) times daily., Disp: 30 tablet, Rfl: 0   busPIRone (BUSPAR) 5 MG tablet, TAKE 1 TABLET BY MOUTH THREE TIMES A DAY, Disp: 270 tablet, Rfl: 2   fluconazole (DIFLUCAN) 150 MG tablet, Take 1 tablet (150 mg total) by mouth daily. Repeat in 1 week if needed, Disp: 2 tablet, Rfl: 0   magic mouthwash w/lidocaine SOLN, Take 15 mLs by mouth 4 (four) times daily as needed for mouth pain. Swish, gargle and spit., Disp: 500 mL, Rfl: 11   Multiple Vitamins-Minerals (ZINC PO), Take by mouth daily., Disp: , Rfl:    nystatin (MYCOSTATIN) 100000 UNIT/ML suspension, Take 5 mLs (500,000 Units total) by mouth 4 (four) times daily. Swish for 30 seconds and spit out., Disp: 180 mL, Rfl: 0   OMEGA-3 FATTY ACIDS PO, Take by mouth., Disp: , Rfl:    testosterone cypionate (DEPOTESTOSTERONE CYPIONATE) 200 MG/ML injection, SMARTSIG:0.5 Milliliter(s) IM Once a Week, Disp: , Rfl:    triamcinolone cream (KENALOG) 0.5 %, Apply 1 application topically 2 (two) times daily. To affected areas. (Patient taking differently: Apply 1 application topically 2 (two) times daily as needed. To affected areas.), Disp: 30 g, Rfl: 3   Medications ordered in this encounter:  Meds ordered this encounter  Medications   amoxicillin-clavulanate (AUGMENTIN) 500-125 MG tablet    Sig: Take 1 tablet (500 mg total) by mouth 3 (three) times daily.    Dispense:  30 tablet    Refill:  0    Order  Specific Question:   Supervising Provider    Answer:   Sabra Heck, BRIAN [[id number]]   nystatin (MYCOSTATIN) 100000 UNIT/ML suspension    Sig: Take 5 mLs (500,000 Units total) by mouth 4 (four) times daily. Swish for 30 seconds and spit out.    Dispense:  180 mL    Refill:  0    Order Specific Question:   Supervising Provider    Answer:   Sabra Heck, BRIAN [[id number]]     *If you need refills on other medications prior to your next appointment, please contact your pharmacy*  Follow-Up: Call back or seek an in-person evaluation if the symptoms worsen or if the condition fails to improve as anticipated.  Other Instructions Otitis Media, Adult Otitis media occurs when there is inflammation and fluid in the middle ear with signs and symptoms of an acute infection. The middle ear is a part of the ear that contains bones for hearing as well as air that helps send sounds to the brain. When infected fluid builds up in this space, it causes pressure and can lead to an ear infection. The eustachian tube connects the middle ear to the back of the nose (nasopharynx) and normally allows air into the middle ear. If the eustachian tube becomes blocked, fluid can build up and become infected. What are the causes? This condition is caused by a blockage in the eustachian tube. This can be caused by mucus or by swelling of the tube. Problems that can  cause a blockage include: A cold or other upper respiratory infection. Allergies. An irritant, such as tobacco smoke. Enlarged adenoids. The adenoids are areas of soft tissue located high in the back of the throat, behind the nose and the roof of the mouth. They are part of the body's defense system (immune system). A mass in the nasopharynx. Damage to the ear caused by pressure changes (barotrauma). What increases the risk? You are more likely to develop this condition if you: Smoke or are exposed to tobacco smoke. Have an opening in the roof of your mouth (cleft  palate). Have gastroesophageal reflux. Have an immune system disorder. What are the signs or symptoms? Symptoms of this condition include: Ear pain. Fever. Decreased hearing. Tiredness (lethargy). Fluid leaking from the ear, if the eardrum is ruptured or has burst. Ringing in the ear. How is this diagnosed? This condition is diagnosed with a physical exam. During the exam, your health care provider will use an instrument called an otoscope to look in your ear and check for redness, swelling, and fluid. He or she will also ask about your symptoms. Your health care provider may also order tests, such as: A pneumatic otoscopy. This is a test to check the movement of the eardrum. It is done by squeezing a small amount of air into the ear. A tympanogram. This is a test that shows how well the eardrum moves in response to air pressure in the ear canal. It provides a graph for your health care provider to review. How is this treated? This condition can go away on its own within 3-5 days. But if the condition is caused by a bacterial infection and does not go away on its own, or if it keeps coming back, your health care provider may: Prescribe antibiotic medicine to treat the infection. Prescribe or recommend medicines to control pain. Follow these instructions at home: Take over-the-counter and prescription medicines only as told by your health care provider. If you were prescribed an antibiotic medicine, take it as told by your health care provider. Do not stop taking the antibiotic even if you start to feel better. Keep all follow-up visits. This is important. Contact a health care provider if: You have bleeding from your nose. There is a lump on your neck. You are not feeling better in 5 days. You feel worse instead of better. Get help right away if: You have severe pain that is not controlled with medicine. You have swelling, redness, or pain around your ear. You have stiffness in your  neck. A part of your face is not moving (paralyzed). The bone behind your ear (mastoid bone) is tender when you touch it. You develop a severe headache. Summary Otitis media is redness, soreness, and swelling of the middle ear, usually resulting in pain and decreased hearing. This condition can go away on its own within 3-5 days. If the problem does not go away in 3-5 days, your health care provider may give you medicines to treat the infection. If you were prescribed an antibiotic medicine, take it as told by your health care provider. Follow all instructions that were given to you by your health care provider. This information is not intended to replace advice given to you by your health care provider. Make sure you discuss any questions you have with your health care provider. Document Revised: 11/26/2020 Document Reviewed: 11/26/2020 Elsevier Patient Education  [place].   Sinusitis, Adult Sinusitis is inflammation of your sinuses. Sinuses are hollow  spaces in the bones around your face. Your sinuses are located: Around your eyes. In the middle of your forehead. Behind your nose. In your cheekbones. Mucus normally drains out of your sinuses. When your nasal tissues become inflamed or swollen, mucus can become trapped or blocked. This allows bacteria, viruses, and fungi to grow, which leads to infection. Most infections of the sinuses are caused by a virus. Sinusitis can develop quickly. It can last for up to 4 weeks (acute) or for more than 12 weeks (chronic). Sinusitis often develops after a cold. What are the causes? This condition is caused by anything that creates swelling in the sinuses or stops mucus from draining. This includes: Allergies. Asthma. Infection from bacteria or viruses. Deformities or blockages in your nose or sinuses. Abnormal growths in the nose (nasal polyps). Pollutants, such as chemicals or irritants in the air. Infection from fungi (rare). What  increases the risk? You are more likely to develop this condition if you: Have a weak body defense system (immune system). Do a lot of swimming or diving. Overuse nasal sprays. Smoke. What are the signs or symptoms? The main symptoms of this condition are pain and a feeling of pressure around the affected sinuses. Other symptoms include: Stuffy nose or congestion. Thick drainage from your nose. Swelling and warmth over the affected sinuses. Headache. Upper toothache. A cough that may get worse at night. Extra mucus that collects in the throat or the back of the nose (postnasal drip). Decreased sense of smell and taste. Fatigue. A fever. Sore throat. Bad breath. How is this diagnosed? This condition is diagnosed based on: Your symptoms. Your medical history. A physical exam. Tests to find out if your condition is acute or chronic. This may include: Checking your nose for nasal polyps. Viewing your sinuses using a device that has a light (endoscope). Testing for allergies or bacteria. Imaging tests, such as an MRI or CT scan. In rare cases, a bone biopsy may be done to rule out more serious types of fungal sinus disease. How is this treated? Treatment for sinusitis depends on the cause and whether your condition is chronic or acute. If caused by a virus, your symptoms should go away on their own within 10 days. You may be given medicines to relieve symptoms. They include: Medicines that shrink swollen nasal passages (topical intranasal decongestants). Medicines that treat allergies (antihistamines). A spray that eases inflammation of the nostrils (topical intranasal corticosteroids). Rinses that help get rid of thick mucus in your nose (nasal saline washes). If caused by bacteria, your health care provider may recommend waiting to see if your symptoms improve. Most bacterial infections will get better without antibiotic medicine. You may be given antibiotics if you have: A severe  infection. A weak immune system. If caused by narrow nasal passages or nasal polyps, you may need to have surgery. Follow these instructions at home: Medicines Take, use, or apply over-the-counter and prescription medicines only as told by your health care provider. These may include nasal sprays. If you were prescribed an antibiotic medicine, take it as told by your health care provider. Do not stop taking the antibiotic even if you start to feel better. Hydrate and humidify  Drink enough fluid to keep your urine pale yellow. Staying hydrated will help to thin your mucus. Use a cool mist humidifier to keep the humidity level in your home above 50%. Inhale steam for 10-15 minutes, 3-4 times a day, or as told by your health care provider.  You can do this in the bathroom while a hot shower is running. Limit your exposure to cool or dry air. Rest Rest as much as possible. Sleep with your head raised (elevated). Make sure you get enough sleep each night. General instructions  Apply a warm, moist washcloth to your face 3-4 times a day or as told by your health care provider. This will help with discomfort. Wash your hands often with soap and water to reduce your exposure to germs. If soap and water are not available, use hand sanitizer. Do not smoke. Avoid being around people who are smoking (secondhand smoke). Keep all follow-up visits as told by your health care provider. This is important. Contact a health care provider if: You have a fever. Your symptoms get worse. Your symptoms do not improve within 10 days. Get help right away if: You have a severe headache. You have persistent vomiting. You have severe pain or swelling around your face or eyes. You have vision problems. You develop confusion. Your neck is stiff. You have trouble breathing. Summary Sinusitis is soreness and inflammation of your sinuses. Sinuses are hollow spaces in the bones around your face. This condition is  caused by nasal tissues that become inflamed or swollen. The swelling traps or blocks the flow of mucus. This allows bacteria, viruses, and fungi to grow, which leads to infection. If you were prescribed an antibiotic medicine, take it as told by your health care provider. Do not stop taking the antibiotic even if you start to feel better. Keep all follow-up visits as told by your health care provider. This is important. This information is not intended to replace advice given to you by your health care provider. Make sure you discuss any questions you have with your health care provider. Document Revised: 01/18/2018 Document Reviewed: 01/18/2018 Elsevier Patient Education  2022 [employer].    If you have been instructed to have an in-person evaluation today at a local Urgent Care facility, please use the link below. It will take you to a list of all of our available [facility], including address, phone number and hours of operation. Please do not delay care.  [facility]  If you or a family member do not have a primary care provider, use the link below to schedule a visit and establish care. When you choose a [facility] primary care physician or advanced practice provider, you gain a long-term partner in health. Find a Primary Care Provider  Learn more about [facility] in-office and virtual care options: [facility]

## 2021-09-06 NOTE — Progress Notes (Signed)
Virtual Visit Consent   Brian Spears, you are scheduled for a virtual visit with a [facility] provider today.     Just as with appointments in the office, your consent must be obtained to participate.  Your consent will be active for this visit and any virtual visit you may have with one of our providers in the next 365 days.     If you have a MyChart account, a copy of this consent can be sent to you electronically.  All virtual visits are billed to your insurance company just like a traditional visit in the office.    As this is a virtual visit, video technology does not allow for your provider to perform a traditional examination.  This may limit your provider's ability to fully assess your condition.  If your provider identifies any concerns that need to be evaluated in person or the need to arrange testing (such as labs, EKG, etc.), we will make arrangements to do so.     Although advances in technology are sophisticated, we cannot ensure that it will always work on either your end or our end.  If the connection with a video visit is poor, the visit may have to be switched to a telephone visit.  With either a video or telephone visit, we are not always able to ensure that we have a secure connection.     I need to obtain your verbal consent now.   Are you willing to proceed with your visit today?    Brian Spears has provided verbal consent on 09/06/2021 for a virtual visit (video or telephone).   Mar Daring, PA-C   Date: 09/06/2021 8:56 AM   Virtual Visit via Video Note   I, Mar Daring, connected with  Brian Spears  ([id number], [birth year]) on 09/06/21 at  8:45 AM EST by a video-enabled telemedicine application and verified that I am speaking with the correct person using two identifiers.  Location: Patient: Virtual Visit Location Patient: Home Provider: Virtual Visit Location Provider: Home Office   I discussed the limitations of evaluation and management by  telemedicine and the availability of in person appointments. The patient expressed understanding and agreed to proceed.    History of Present Illness: Brian Spears is a 46 y.o. who identifies as a male who was assigned male at birth, and is being seen today for possible sinus infection.  HPI: Sinusitis This is a new problem. The current episode started 1 to 4 weeks ago (off and on for 3 weeks). The problem has been gradually worsening since onset. The maximum temperature recorded prior to his arrival was 101 - 101.9 F. The fever has been present for 1 to 2 days. The pain is moderate. Associated symptoms include chills, congestion, ear pain (right > left), headaches and sinus pressure. Pertinent negatives include no hoarse voice or sore throat. (Dizziness, ear pain, tinnitus, post nasal drainage, body aches, general malaise) Past treatments include acetaminophen (natural nasal rinse). The treatment provided no relief.     Problems:  Patient Active Problem List   Diagnosis Date Noted   COVID-19 09/17/2020   History of Lyme disease 03/25/2019   Chronic migraine 03/10/2019   Generalized anxiety manifesting as headache 01/18/2019   Lateral epicondylitis, right elbow 12/15/2018   Plantar fasciitis, left 03/05/2018   Right shoulder pain 07/02/2017   Tinnitus, right 06/09/2016   Medial epicondylitis of right elbow 02/18/2016   Onychodystrophy 12/05/2014   Primary osteoarthritis of both  elbows 12/05/2014   Elevated hemoglobin ([place]) 08/01/2013   Male hypogonadism 06/01/2013   GERD (gastroesophageal reflux disease) 03/01/2013   History of smoking 06/07/2012   Annual physical exam 06/07/2012   Celiac disease 06/30/2011   Hyperlipidemia 09/27/2010   Bilateral carpal tunnel syndrome 09/27/2010    Allergies:  Allergies  Allergen Reactions   Sulfa Antibiotics Other (See Comments)    headaches   Egg White (Diagnostic)    Gluten Meal     GI issues   Naproxen Sodium Swelling    Peanut-Containing Drug Products    Salmon [Fish Oil]    Barley Grass Dermatitis, Diarrhea, Nausea And Vomiting and Rash   Corn-Containing Products Dermatitis, Diarrhea, Nausea And Vomiting and Rash   Crab (Diagnostic) Dermatitis, Diarrhea, Nausea And Vomiting and Rash   Oat Grain (Diagnostic) Dermatitis, Diarrhea, Nausea And Vomiting and Rash   Other Dermatitis, Nausea And Vomiting, Rash and Diarrhea   Shellfish Allergy Dermatitis, Diarrhea, Nausea And Vomiting and Rash   Shrimp (Diagnostic) Dermatitis, Diarrhea, Itching and Rash   Medications:  Current Outpatient Medications:    amoxicillin-clavulanate (AUGMENTIN) 500-125 MG tablet, Take 1 tablet (500 mg total) by mouth 3 (three) times daily., Disp: 30 tablet, Rfl: 0   busPIRone (BUSPAR) 5 MG tablet, TAKE 1 TABLET BY MOUTH THREE TIMES A DAY, Disp: 270 tablet, Rfl: 2   fluconazole (DIFLUCAN) 150 MG tablet, Take 1 tablet (150 mg total) by mouth daily. Repeat in 1 week if needed, Disp: 2 tablet, Rfl: 0   magic mouthwash w/lidocaine SOLN, Take 15 mLs by mouth 4 (four) times daily as needed for mouth pain. Swish, gargle and spit., Disp: 500 mL, Rfl: 11   Multiple Vitamins-Minerals (ZINC PO), Take by mouth daily., Disp: , Rfl:    nystatin (MYCOSTATIN) 100000 UNIT/ML suspension, Take 5 mLs (500,000 Units total) by mouth 4 (four) times daily. Swish for 30 seconds and spit out., Disp: 180 mL, Rfl: 0   OMEGA-3 FATTY ACIDS PO, Take by mouth., Disp: , Rfl:    testosterone cypionate (DEPOTESTOSTERONE CYPIONATE) 200 MG/ML injection, SMARTSIG:0.5 Milliliter(s) IM Once a Week, Disp: , Rfl:    triamcinolone cream (KENALOG) 0.5 %, Apply 1 application topically 2 (two) times daily. To affected areas. (Patient taking differently: Apply 1 application topically 2 (two) times daily as needed. To affected areas.), Disp: 30 g, Rfl: 3  Observations/Objective: Patient is well-developed, well-nourished in no acute distress.  Resting comfortably at home.  Head is  normocephalic, atraumatic.  No labored breathing.  Speech is clear and coherent with logical content.  Patient is alert and oriented at baseline.    Assessment and Plan: 1. Acute bacterial sinusitis - amoxicillin-clavulanate (AUGMENTIN) 500-125 MG tablet; Take 1 tablet (500 mg total) by mouth 3 (three) times daily.  Dispense: 30 tablet; Refill: 0  2. Non-recurrent acute suppurative otitis media of right ear without spontaneous rupture of tympanic membrane - amoxicillin-clavulanate (AUGMENTIN) 500-125 MG tablet; Take 1 tablet (500 mg total) by mouth 3 (three) times daily.  Dispense: 30 tablet; Refill: 0  3. Antibiotic-induced yeast infection - nystatin (MYCOSTATIN) 100000 UNIT/ML suspension; Take 5 mLs (500,000 Units total) by mouth 4 (four) times daily. Swish for 30 seconds and spit out.  Dispense: 180 mL; Refill: 0  - Worsening symptoms over 3 weeks off and on - Suspect ear infection and sinusitis - Augmentin prescribed - Gets thrush with antibiotics, Nystatin prescribed - Push fluids - Continue home medications - Rest as needed - Seek in person evaluation if symptoms fail to  improve or worsen   Follow Up Instructions: I discussed the assessment and treatment plan with the patient. The patient was provided an opportunity to ask questions and all were answered. The patient agreed with the plan and demonstrated an understanding of the instructions.  A copy of instructions were sent to the patient via MyChart unless otherwise noted below.    The patient was advised to call back or seek an in-person evaluation if the symptoms worsen or if the condition fails to improve as anticipated.  Time:  I spent 12 minutes with the patient via telehealth technology discussing the above problems/concerns.    Mar Daring, PA-C

## 2021-09-11 DIAGNOSIS — F4320 Adjustment disorder, unspecified: Secondary | ICD-10-CM | POA: Diagnosis not present

## 2021-09-11 DIAGNOSIS — Z630 Problems in relationship with spouse or partner: Secondary | ICD-10-CM | POA: Diagnosis not present

## 2021-09-16 DIAGNOSIS — H6981 Other specified disorders of Eustachian tube, right ear: Secondary | ICD-10-CM | POA: Diagnosis not present

## 2021-09-18 DIAGNOSIS — F4320 Adjustment disorder, unspecified: Secondary | ICD-10-CM | POA: Diagnosis not present

## 2021-09-18 DIAGNOSIS — Z630 Problems in relationship with spouse or partner: Secondary | ICD-10-CM | POA: Diagnosis not present

## 2021-09-19 DIAGNOSIS — R790 Abnormal level of blood mineral: Secondary | ICD-10-CM | POA: Diagnosis not present

## 2021-09-24 ENCOUNTER — Encounter: Payer: Self-pay | Admitting: Sports Medicine

## 2021-09-24 DIAGNOSIS — H6981 Other specified disorders of Eustachian tube, right ear: Secondary | ICD-10-CM | POA: Diagnosis not present

## 2021-09-25 DIAGNOSIS — F4320 Adjustment disorder, unspecified: Secondary | ICD-10-CM | POA: Diagnosis not present

## 2021-09-25 DIAGNOSIS — Z630 Problems in relationship with spouse or partner: Secondary | ICD-10-CM | POA: Diagnosis not present

## 2021-09-25 DIAGNOSIS — R5383 Other fatigue: Secondary | ICD-10-CM | POA: Diagnosis not present

## 2021-09-25 DIAGNOSIS — R790 Abnormal level of blood mineral: Secondary | ICD-10-CM | POA: Diagnosis not present

## 2021-09-25 MED ORDER — VALACYCLOVIR HCL 1 G PO TABS: 1000.0000 mg | ORAL_TABLET | Freq: Two times a day (BID) | ORAL | 2 refills | Status: DC

## 2021-10-03 ENCOUNTER — Encounter: Payer: Self-pay | Admitting: Sports Medicine

## 2021-10-03 NOTE — Telephone Encounter (Signed)
Patient scheduled for a MyChart Video visit 10/04/2021 at 10 AM

## 2021-10-04 ENCOUNTER — Encounter: Payer: Self-pay | Admitting: Sports Medicine

## 2021-10-04 ENCOUNTER — Other Ambulatory Visit: Payer: Self-pay

## 2021-10-04 ENCOUNTER — Telehealth (INDEPENDENT_AMBULATORY_CARE_PROVIDER_SITE_OTHER): Payer: BC Managed Care – PPO | Admitting: Sports Medicine

## 2021-10-04 DIAGNOSIS — D582 Other hemoglobinopathies: Secondary | ICD-10-CM

## 2021-10-04 DIAGNOSIS — U071 COVID-19: Secondary | ICD-10-CM | POA: Diagnosis not present

## 2021-10-04 MED ORDER — NIRMATRELVIR/RITONAVIR (PAXLOVID)TABLET: ORAL_TABLET | ORAL | 0 refills | Status: DC

## 2021-10-04 NOTE — Progress Notes (Signed)
Just has surgery on ear, tube put in. Iron infusions weekly. EBV "through the roof".

## 2021-10-04 NOTE — Progress Notes (Addendum)
Virtual Visit via WebEx/MyChart   I connected with  Brian Spears  on 10/04/21 via WebEx/MyChart/Doximity Video and verified that I am speaking with the correct person using two identifiers.   I discussed the limitations, risks, security and privacy concerns of performing an evaluation and management service by WebEx/MyChart/Doximity Video, including the higher likelihood of inaccurate diagnosis and treatment, and the availability of in person appointments.  We also discussed the likely need of an additional face to face encounter for complete and high quality delivery of care.  I also discussed with the patient that there may be a patient responsible charge related to this service. The patient expressed understanding and wishes to proceed.  Provider location is in medical facility. Patient location is at their home, different from provider location. People involved in care of the patient during this telehealth encounter were myself, my nurse/medical assistant, and my front office/scheduling team member.  Review of Systems: No fevers, chills, night sweats, weight loss, chest pain, or shortness of breath.   Objective Findings:    General: Speaking full sentences, no audible heavy breathing.  Sounds alert and appropriately interactive.  Appears well.  Face symmetric.  Extraocular movements intact.  Pupils equal and round.  No nasal flaring or accessory muscle use visualized.  Independent interpretation of tests performed by another provider:   None.  Brief History, Exam, Impression, and Recommendations:    COVID-19 This is a 46 year old male, he is having some sore throat, other viral symptoms, this is his third viral URI over the past several months. He did well through the summertime. Explained to him that this was not a typical, and that this would likely pass as well, to do some over-the-counter cold and flu medications such as DayQuil and NyQuil. He also endorsed some increasing  insomnia, on further questioning and discussion of his sleep hygiene he does tend to drink green tea as well as lots of water before bedtime and then has trouble falling asleep and staying asleep. In the meantime I would like him to do NyQuil at night, avoid green tea, avoid any beverages 2 to 3 hours before going to sleep, and we can revisit this in a couple of weeks. He did tell me that his therapist had recommended a medicine to use to help him sleep, he will find out which what it is and let me know.  Update: Brian Spears does not get COVID vaccines, he did a home COVID test and it is positive explaining his current symptoms, adding Paxlovid.  Elevated hemoglobin Brian Spears has always had normal hemoglobins, sometimes elevated, he tells me [facility] has been treating him for anemia, the last hemoglobin we have for him was in the 17's.  He also tells me Brian [facility] virus titers are elevated, this can be a normal finding in adults that have been exposed in the past. I think he can keep management of these with [facility].  I discussed the above assessment and treatment plan with the patient. The patient was provided an opportunity to ask questions and all were answered. The patient agreed with the plan and demonstrated an understanding of the instructions.   The patient was advised to call back or seek an in-person evaluation if the symptoms worsen or if the condition fails to improve as anticipated.   I provided 30 minutes of face to face and non-face-to-face time during this encounter date, time was needed to gather information, review chart, records, communicate/coordinate with staff remotely, as  well as complete documentation.   ___________________________________________ Brian Spears. Brian Spears, M.D., ABFM., CAQSM. Primary Care and [place] Instructor of [employer]  [employer]

## 2021-10-04 NOTE — Assessment & Plan Note (Addendum)
This is a 46 year old male, he is having some sore throat, other viral symptoms, this is his third viral URI over the past several months. He did well through the summertime. Explained to him that this was not a typical, and that this would likely pass as well, to do some over-the-counter cold and flu medications such as DayQuil and NyQuil. He also endorsed some increasing insomnia, on further questioning and discussion of his sleep hygiene he does tend to drink green tea as well as lots of water before bedtime and then has trouble falling asleep and staying asleep. In the meantime I would like him to do NyQuil at night, avoid green tea, avoid any beverages 2 to 3 hours before going to sleep, and we can revisit this in a couple of weeks. He did tell me that his therapist had recommended a medicine to use to help him sleep, he will find out which what it is and let me know.  Update: Brian Spears does not get COVID vaccines, he did a home COVID test and it is positive explaining his current symptoms, adding Paxlovid.

## 2021-10-04 NOTE — Assessment & Plan Note (Signed)
Brian Spears has always had normal hemoglobins, sometimes elevated, he tells me [name] has been treating him for anemia, the last hemoglobin we have for him was in the 17's.  He also tells me [name] virus titers are elevated, this can be a normal finding in adults that have been exposed in the past. I think he can keep management of these with [name].

## 2021-10-04 NOTE — Addendum Note (Signed)
Addended by: Silverio Decamp on: 10/04/2021 02:47 PM   Modules accepted: Orders

## 2021-10-16 DIAGNOSIS — I10 Essential (primary) hypertension: Secondary | ICD-10-CM | POA: Diagnosis not present

## 2021-10-16 DIAGNOSIS — Z630 Problems in relationship with spouse or partner: Secondary | ICD-10-CM | POA: Diagnosis not present

## 2021-10-16 DIAGNOSIS — R0602 Shortness of breath: Secondary | ICD-10-CM | POA: Diagnosis not present

## 2021-10-16 DIAGNOSIS — R42 Dizziness and giddiness: Secondary | ICD-10-CM | POA: Diagnosis not present

## 2021-10-16 DIAGNOSIS — Z20822 Contact with and (suspected) exposure to covid-19: Secondary | ICD-10-CM | POA: Diagnosis not present

## 2021-10-16 DIAGNOSIS — R110 Nausea: Secondary | ICD-10-CM | POA: Diagnosis not present

## 2021-10-16 DIAGNOSIS — F439 Reaction to severe stress, unspecified: Secondary | ICD-10-CM | POA: Diagnosis not present

## 2021-10-16 DIAGNOSIS — F4320 Adjustment disorder, unspecified: Secondary | ICD-10-CM | POA: Diagnosis not present

## 2021-10-17 ENCOUNTER — Encounter: Payer: Self-pay | Admitting: Sports Medicine

## 2021-10-17 DIAGNOSIS — R42 Dizziness and giddiness: Secondary | ICD-10-CM | POA: Diagnosis not present

## 2021-10-17 DIAGNOSIS — F439 Reaction to severe stress, unspecified: Secondary | ICD-10-CM | POA: Diagnosis not present

## 2021-10-18 ENCOUNTER — Telehealth: Payer: BC Managed Care – PPO | Admitting: Nurse Practitioner

## 2021-10-18 DIAGNOSIS — F410 Panic disorder [episodic paroxysmal anxiety] without agoraphobia: Secondary | ICD-10-CM | POA: Diagnosis not present

## 2021-10-18 MED ORDER — HYDROXYZINE PAMOATE 25 MG PO CAPS: 25.0000 mg | ORAL_CAPSULE | Freq: Three times a day (TID) | ORAL | 0 refills | Status: DC | PRN

## 2021-10-18 NOTE — Patient Instructions (Signed)

## 2021-10-18 NOTE — Progress Notes (Signed)
Virtual Visit Consent   Brian Spears, you are scheduled for a virtual visit with [name], [place], a [facility] provider, today.     Just as with appointments in the office, your consent must be obtained to participate.  Your consent will be active for this visit and any virtual visit you may have with one of our providers in the next 365 days.     If you have a MyChart account, a copy of this consent can be sent to you electronically.  All virtual visits are billed to your insurance company just like a traditional visit in the office.    As this is a virtual visit, video technology does not allow for your provider to perform a traditional examination.  This may limit your provider's ability to fully assess your condition.  If your provider identifies any concerns that need to be evaluated in person or the need to arrange testing (such as labs, EKG, etc.), we will make arrangements to do so.     Although advances in technology are sophisticated, we cannot ensure that it will always work on either your end or our end.  If the connection with a video visit is poor, the visit may have to be switched to a telephone visit.  With either a video or telephone visit, we are not always able to ensure that we have a secure connection.     I need to obtain your verbal consent now.   Are you willing to proceed with your visit today? YES   VERSHAWN WESTRUP has provided verbal consent on 10/18/2021 for a virtual visit (video or telephone).   [name], FNP   Date: 10/18/2021 7:11 PM   Virtual Visit via Video Note   I, [name], connected with Brian Spears ([id number], [birth year]) on 10/18/21 at  7:15 PM EST by a video-enabled telemedicine application and verified that I am speaking with the correct person using two identifiers.  Location: Patient: Virtual Visit Location Patient: Home Provider: Virtual Visit Location Provider: Mobile   I discussed the limitations of  evaluation and management by telemedicine and the availability of in person appointments. The patient expressed understanding and agreed to proceed.    History of Present Illness: Brian Spears is a 46 y.o. who identifies as a male who was assigned male at birth, and is being seen today for anxiety.  HPI: Wednesday afternoon he was out shopping and started feeling bad. He went home and layed down. Woke up and started feeling dizzy and hot. Called 911 and went to the ED. They ran a bunch of test on him nad dx with panic attacks. He has a history of anxiety and sees a Social worker. He is currently on buspar in which he takes daily. The ED doctor thinks he should try something else. He has tried contacting his PCP but got no response today. He feels like another panic attack is starting to come on.    Review of Systems  Constitutional:  Negative for diaphoresis and weight loss.  Eyes:  Negative for blurred vision, double vision and pain.  Respiratory:  Negative for shortness of breath.   Cardiovascular:  Negative for chest pain, palpitations, orthopnea and leg swelling.  Gastrointestinal:  Negative for abdominal pain.  Skin:  Negative for rash.  Neurological:  Negative for dizziness, sensory change, loss of consciousness, weakness and headaches.  Endo/Heme/Allergies:  Negative for polydipsia. Does not bruise/bleed easily.  Psychiatric/Behavioral:  Negative for memory loss.  The patient does not have insomnia.   All other systems reviewed and are negative.  Problems:  Patient Active Problem List   Diagnosis Date Noted   COVID-19 10/04/2021   History of Lyme disease 03/25/2019   Chronic migraine 03/10/2019   Generalized anxiety manifesting as headache 01/18/2019   Lateral epicondylitis, right elbow 12/15/2018   Plantar fasciitis, left 03/05/2018   Right shoulder pain 07/02/2017   Tinnitus, right 06/09/2016   Medial epicondylitis of right elbow 02/18/2016   Onychodystrophy 12/05/2014   Primary  osteoarthritis of both elbows 12/05/2014   Elevated hemoglobin ([place]) 08/01/2013   Male hypogonadism 06/01/2013   GERD (gastroesophageal reflux disease) 03/01/2013   History of smoking 06/07/2012   Annual physical exam 06/07/2012   Celiac disease 06/30/2011   Hyperlipidemia 09/27/2010   Bilateral carpal tunnel syndrome 09/27/2010    Allergies:  Allergies  Allergen Reactions   Sulfa Antibiotics Other (See Comments)    headaches   Egg White (Diagnostic)    Gluten Meal     GI issues   Naproxen Sodium Swelling   Peanut-Containing Drug Products    Salmon [Fish Oil]    Barley Grass Dermatitis, Diarrhea, Nausea And Vomiting and Rash   Corn-Containing Products Dermatitis, Diarrhea, Nausea And Vomiting and Rash   Crab (Diagnostic) Dermatitis, Diarrhea, Nausea And Vomiting and Rash   Oat Grain (Diagnostic) Dermatitis, Diarrhea, Nausea And Vomiting and Rash   Other Dermatitis, Nausea And Vomiting, Rash and Diarrhea   Shellfish Allergy Dermatitis, Diarrhea, Nausea And Vomiting and Rash   Shrimp (Diagnostic) Dermatitis, Diarrhea, Itching and Rash   Medications:  Current Outpatient Medications:    busPIRone (BUSPAR) 5 MG tablet, TAKE 1 TABLET BY MOUTH THREE TIMES A DAY, Disp: 270 tablet, Rfl: 2   fluconazole (DIFLUCAN) 150 MG tablet, Take 1 tablet (150 mg total) by mouth daily. Repeat in 1 week if needed, Disp: 2 tablet, Rfl: 0   magic mouthwash w/lidocaine SOLN, Take 15 mLs by mouth 4 (four) times daily as needed for mouth pain. Swish, gargle and spit., Disp: 500 mL, Rfl: 11   Multiple Vitamins-Minerals (ZINC PO), Take by mouth daily., Disp: , Rfl:    nirmatrelvir/ritonavir EUA (PAXLOVID) 20 x 150 MG & 10 x 100MG  TABS, 1 dose p.o. twice daily for 5 days, may use any available formulation at pharmacy with the same total dosage., Disp: 30 tablet, Rfl: 0   nystatin (MYCOSTATIN) 100000 UNIT/ML suspension, Take 5 mLs (500,000 Units total) by mouth 4 (four) times daily. Swish for 30 seconds and spit  out., Disp: 180 mL, Rfl: 0   ofloxacin (FLOXIN) 0.3 % OTIC solution, SMARTSIG:4 Drop(s) Left Ear Twice Daily, Disp: , Rfl:    OMEGA-3 FATTY ACIDS PO, Take by mouth., Disp: , Rfl:    testosterone cypionate (DEPOTESTOSTERONE CYPIONATE) 200 MG/ML injection, SMARTSIG:0.5 Milliliter(s) IM Once a Week, Disp: , Rfl:    triamcinolone cream (KENALOG) 0.5 %, Apply 1 application topically 2 (two) times daily. To affected areas. (Patient taking differently: Apply 1 application topically 2 (two) times daily as needed. To affected areas.), Disp: 30 g, Rfl: 3  Observations/Objective: Patient is well-developed, well-nourished in no acute distress.  Resting comfortably  at home.  Head is normocephalic, atraumatic.  No labored breathing.  Speech is clear and coherent with logical content.  Patient is alert and oriented at baseline.    Assessment and Plan:  YANIXAN MELLINGER in today with chief complaint of panic attacks  1. Panic attacks Cannot give control drugs Patient has taken  hydroxyzine in the past and does not have an allergy Make an appointment with your PCP as soon possible to start on lexapro or zoloft.  Stress management      Follow Up Instructions: I discussed the assessment and treatment plan with the patient. The patient was provided an opportunity to ask questions and all were answered. The patient agreed with the plan and demonstrated an understanding of the instructions.  A copy of instructions were sent to the patient via MyChart.  The patient was advised to call back or seek an in-person evaluation if the symptoms worsen or if the condition fails to improve as anticipated.  Time:  I spent 13  minutes with the patient via telehealth technology discussing the above problems/concerns.    [name], FNP

## 2021-10-23 DIAGNOSIS — R5383 Other fatigue: Secondary | ICD-10-CM | POA: Diagnosis not present

## 2021-10-23 DIAGNOSIS — F4320 Adjustment disorder, unspecified: Secondary | ICD-10-CM | POA: Diagnosis not present

## 2021-10-23 DIAGNOSIS — E039 Hypothyroidism, unspecified: Secondary | ICD-10-CM | POA: Diagnosis not present

## 2021-10-23 DIAGNOSIS — Z77120 Contact with and (suspected) exposure to mold (toxic): Secondary | ICD-10-CM | POA: Diagnosis not present

## 2021-10-23 DIAGNOSIS — Z630 Problems in relationship with spouse or partner: Secondary | ICD-10-CM | POA: Diagnosis not present

## 2021-10-24 ENCOUNTER — Encounter: Payer: Self-pay | Admitting: Sports Medicine

## 2021-10-24 DIAGNOSIS — H8112 Benign paroxysmal vertigo, left ear: Secondary | ICD-10-CM | POA: Diagnosis not present

## 2021-10-24 DIAGNOSIS — F419 Anxiety disorder, unspecified: Secondary | ICD-10-CM

## 2021-10-24 DIAGNOSIS — F411 Generalized anxiety disorder: Secondary | ICD-10-CM

## 2021-10-25 MED ORDER — ALPRAZOLAM 0.5 MG PO TABS: 0.5000 mg | ORAL_TABLET | Freq: Every day | ORAL | 3 refills | Status: DC | PRN

## 2021-11-06 DIAGNOSIS — F4320 Adjustment disorder, unspecified: Secondary | ICD-10-CM | POA: Diagnosis not present

## 2021-11-06 DIAGNOSIS — Z630 Problems in relationship with spouse or partner: Secondary | ICD-10-CM | POA: Diagnosis not present

## 2021-11-13 ENCOUNTER — Other Ambulatory Visit: Payer: Self-pay | Admitting: Nurse Practitioner

## 2021-11-13 DIAGNOSIS — E039 Hypothyroidism, unspecified: Secondary | ICD-10-CM | POA: Diagnosis not present

## 2021-11-13 DIAGNOSIS — R5383 Other fatigue: Secondary | ICD-10-CM | POA: Diagnosis not present

## 2021-11-13 DIAGNOSIS — K900 Celiac disease: Secondary | ICD-10-CM | POA: Diagnosis not present

## 2021-11-13 DIAGNOSIS — E559 Vitamin D deficiency, unspecified: Secondary | ICD-10-CM | POA: Diagnosis not present

## 2021-11-13 DIAGNOSIS — E538 Deficiency of other specified B group vitamins: Secondary | ICD-10-CM | POA: Diagnosis not present

## 2021-11-13 DIAGNOSIS — F419 Anxiety disorder, unspecified: Secondary | ICD-10-CM | POA: Diagnosis not present

## 2021-11-13 DIAGNOSIS — R7303 Prediabetes: Secondary | ICD-10-CM | POA: Diagnosis not present

## 2021-11-13 DIAGNOSIS — Z125 Encounter for screening for malignant neoplasm of prostate: Secondary | ICD-10-CM | POA: Diagnosis not present

## 2021-11-20 ENCOUNTER — Other Ambulatory Visit: Payer: Self-pay

## 2021-11-20 ENCOUNTER — Emergency Department
Admission: EM | Admit: 2021-11-20 | Discharge: 2021-11-20 | Disposition: A | Discharge status: Home / Self Care | Payer: BC Managed Care – PPO | Source: Home / Self Care

## 2021-11-20 DIAGNOSIS — R42 Dizziness and giddiness: Secondary | ICD-10-CM | POA: Diagnosis not present

## 2021-11-20 DIAGNOSIS — Z630 Problems in relationship with spouse or partner: Secondary | ICD-10-CM | POA: Diagnosis not present

## 2021-11-20 DIAGNOSIS — F4320 Adjustment disorder, unspecified: Secondary | ICD-10-CM | POA: Diagnosis not present

## 2021-11-20 MED ORDER — LORAZEPAM 1 MG PO TABS: ORAL_TABLET | ORAL | 0 refills | Status: DC

## 2021-11-20 NOTE — ED Provider Notes (Signed)
[facility]    CSN: [id number] Arrival date & time: 11/20/21  1249      History   Chief Complaint Chief Complaint  Patient presents with   Dizziness    Dizziness and nausea.  X1 day    HPI Brian Spears is a 46 y.o. male.   HPI  Patient is here for his vertigo.  He states he has seen an ENT doctor and has been diagnosed with vestibular neuritis.  He is taking valacyclovir for this.  The ENT doctor recommended that next time he had a spell of vertigo he should get a medical evaluation.  Patient is here for that reason.  He also states that his ENT told him that he should be on lorazepam instead of Xanax for his vertigo.  Patient is requesting a change of medication.  I reviewed his notes.  I reviewed Dr. Melvia Heaps notes.  I reviewed the [place] controlled substance database and there is no reason not to give him a limited number of lorazepam, however, the patient tells me he did have a benzodiazepine dependence disorder and was on clonazepam for many years and that it was very difficult for him to get off of this medicine.  I cautioned him not to take this medicine any more than as needed.  I did tell him I will not refill the medicine. Currently has vertigo.  He states his eyes do not focus.  He gets nauseated and will vomit.  He states this morning he had some large tonsil stones.  He has pressure in his right ear.  Right ear is hypersensitive to noise.  He comes in with cotton in the outer ear canal. His medication list currently lists Xanax, BuSpar, Vistaril and Ativan although he denies anxiety as part of this issue.  States that he does not think it is due to stress.  Past Medical History:  Diagnosis Date   Anxiety    Arthritis    Celiac disease    Hand fracture    Headache    Hyperlipidemia    Lyme disease    Migraines    Normal cardiac stress test 4/10   at [facility] - negative.  terminated for fatigue    Patient Active Problem List   Diagnosis Date Noted    COVID-19 10/04/2021   History of Lyme disease 03/25/2019   Chronic migraine 03/10/2019   Generalized anxiety manifesting as headache 01/18/2019   Lateral epicondylitis, right elbow 12/15/2018   Plantar fasciitis, left 03/05/2018   Right shoulder pain 07/02/2017   Tinnitus, right 06/09/2016   Medial epicondylitis of right elbow 02/18/2016   Onychodystrophy 12/05/2014   Primary osteoarthritis of both elbows 12/05/2014   Elevated hemoglobin ([facility]) 08/01/2013   Male hypogonadism 06/01/2013   GERD (gastroesophageal reflux disease) 03/01/2013   History of smoking 06/07/2012   Annual physical exam 06/07/2012   Celiac disease 06/30/2011   Hyperlipidemia 09/27/2010   Bilateral carpal tunnel syndrome 09/27/2010    Past Surgical History:  Procedure Laterality Date   surgery on right hand Bilateral        Home Medications    Prior to Admission medications   Medication Sig Start Date End Date Taking? Authorizing Provider  ALPRAZolam Prudy Feeler) 0.5 MG tablet Take 1 tablet (0.5 mg total) by mouth daily as needed for anxiety. 10/25/21  Yes Monica Becton, MD  busPIRone (BUSPAR) 5 MG tablet TAKE 1 TABLET BY MOUTH THREE TIMES A DAY 01/29/21  Yes Monica Becton, MD  hydrOXYzine (VISTARIL) 25 MG capsule Take 1 capsule (25 mg total) by mouth every 8 (eight) hours as needed. 10/18/21  Yes Daphine Deutscher, Mary-Margaret, FNP  LORazepam (ATIVAN) 1 MG tablet Take one tablet po 2 x a day as needed vertigo 11/20/21  Yes Eustace Moore, MD  magic mouthwash w/lidocaine SOLN Take 15 mLs by mouth 4 (four) times daily as needed for mouth pain. Swish, gargle and spit. 03/07/21  Yes Sunnie Nielsen, DO  Multiple Vitamins-Minerals (ZINC PO) Take by mouth daily.   Yes [provider]  nystatin (MYCOSTATIN) 100000 UNIT/ML suspension Take 5 mLs (500,000 Units total) by mouth 4 (four) times daily. Swish for 30 seconds and spit out. 09/06/21  Yes Joycelyn Man M, PA-C  ofloxacin (FLOXIN) 0.3 % OTIC  solution SMARTSIG:4 Drop(s) Left Ear Twice Daily 09/24/21  Yes [provider]  OMEGA-3 FATTY ACIDS PO Take by mouth.   Yes [provider]  testosterone cypionate (DEPOTESTOSTERONE CYPIONATE) 200 MG/ML injection SMARTSIG:0.5 Milliliter(s) IM Once a Week 02/05/21  Yes [provider]  triamcinolone cream (KENALOG) 0.5 % Apply 1 application topically 2 (two) times daily. To affected areas. Patient taking differently: Apply 1 application. topically 2 (two) times daily as needed. To affected areas. 08/31/17  Yes Monica Becton, MD  valACYclovir (VALTREX) 1000 MG tablet Take 1,000 mg by mouth 2 (two) times daily. 11/20/21  Yes [provider]    Family History Family History  Problem Relation Age of Onset   Breast cancer Mother    Hyperlipidemia Mother    Hypertension Father    Hyperlipidemia Father    Other Other        grandfather with alcoholism   Heart attack Other        grandmother   Diabetes Other        grandmother   Colon cancer Other        great uncles on Dads side   Breast cancer Other        aunt   Lung cancer Other        grandmother   Prostate cancer Other        grandfather   Melanoma Other        uncle   Esophageal cancer Neg Hx    Rectal cancer Neg Hx    Stomach cancer Neg Hx     Social History Social History   Tobacco Use   Smoking status: Former    Packs/day: 0.20    Types: Cigarettes   Smokeless tobacco: Never   Tobacco comments:    started smoking at age 32, started smokeless tobacco at age 50  Vaping Use   Vaping Use: Some days  Substance Use Topics   Alcohol use: No   Drug use: No     Allergies   Sulfa antibiotics, Egg white (diagnostic), Gluten meal, Naproxen sodium, Peanut-containing drug products, Salmon [fish oil], Barley grass, Corn-containing products, Crab (diagnostic), Oat grain (diagnostic), Other, Shellfish allergy, and Shrimp (diagnostic)   Review of Systems Review of Systems See  HPI  Physical Exam Triage Vital Signs ED Triage Vitals  Enc Vitals Group     BP 11/20/21 1306 130/78     Pulse Rate 11/20/21 1306 83     Resp 11/20/21 1306 18     Temp 11/20/21 1306 97.8 F (36.6 C)     Temp Source 11/20/21 1306 Oral     SpO2 11/20/21 1306 99 %     Weight 11/20/21 1302 180 lb (81.6 kg)  Height 11/20/21 1302 5\' 8"  (1.727 m)     Head Circumference --      Peak Flow --      Pain Score 11/20/21 1302 0     Pain Loc --      Pain Edu? --      Excl. in GC? --    No data found.  Updated Vital Signs BP 130/78   Pulse 83   Temp 97.8 F (36.6 C) (Oral)   Resp 18   Ht 5\' 8"  (1.727 m)   Wt 81.6 kg   SpO2 99%   BMI 27.37 kg/m      Physical Exam Constitutional:      General: He is not in acute distress.    Appearance: He is well-developed and normal weight.  HENT:     Head: Normocephalic and atraumatic.     Right Ear: Tympanic membrane and ear canal normal.     Left Ear: Tympanic membrane and ear canal normal.     Ears:     Comments: TMs are both clear.  Right TM has tympanostomy tube in place    Nose: Nose normal.     Mouth/Throat:     Mouth: Mucous membranes are moist.     Pharynx: No posterior oropharyngeal erythema.     Comments: No tonsil stones identified Eyes:     Conjunctiva/sclera: Conjunctivae normal.     Pupils: Pupils are equal, round, and reactive to light.     Comments: No nystagmus  Cardiovascular:     Rate and Rhythm: Normal rate and regular rhythm.     Heart sounds: Normal heart sounds.  Pulmonary:     Effort: Pulmonary effort is normal. No respiratory distress.     Breath sounds: Normal breath sounds. No wheezing or rhonchi.  Abdominal:     General: There is no distension.     Palpations: Abdomen is soft.  Musculoskeletal:        General: Normal range of motion.     Cervical back: Normal range of motion.  Lymphadenopathy:     Cervical: No cervical adenopathy.  Skin:    General: Skin is warm and dry.  Neurological:      Mental Status: He is alert.     Gait: Gait normal.     UC Treatments / Results  Labs (all labs ordered are listed, but only abnormal results are displayed) Labs Reviewed - No data to display  EKG   Radiology No results found.  Procedures Procedures (including critical care time)  Medications Ordered in UC Medications - No data to display  Initial Impression / Assessment and Plan / UC Course  I have reviewed the triage vital signs and the nursing notes.  Pertinent labs & imaging results that were available during my care of the patient were reviewed by me and considered in my medical decision making (see chart for details).     Final Clinical Impressions(s) / UC Diagnoses   Final diagnoses:  Vertigo     Discharge Instructions      Follow up with your ENT I have prescribed a limited number of lorazepam.  Contact your usual doctor for refills   ED Prescriptions     Medication Sig Dispense Auth. Provider   LORazepam (ATIVAN) 1 MG tablet Take one tablet po 2 x a day as needed vertigo 15 tablet Eustace Moore, MD      I have reviewed the PDMP during this encounter.   Eustace Moore, MD 11/20/21  1401 ° °

## 2021-11-20 NOTE — Discharge Instructions (Addendum)
Follow up with your ENT ?I have prescribed a limited number of lorazepam.  Contact your usual doctor for refills ?

## 2021-11-20 NOTE — ED Triage Notes (Signed)
Pt states that he has some dizziness, nausea, and right ear pressure x1 day ? ?Pt states that he isn't vaccinated for covid.  ?Pt states that he hasn't had flu vaccine.  ?

## 2021-11-25 DIAGNOSIS — H8190 Unspecified disorder of vestibular function, unspecified ear: Secondary | ICD-10-CM | POA: Diagnosis not present

## 2021-11-25 DIAGNOSIS — R42 Dizziness and giddiness: Secondary | ICD-10-CM | POA: Diagnosis not present

## 2021-11-25 DIAGNOSIS — H6981 Other specified disorders of Eustachian tube, right ear: Secondary | ICD-10-CM | POA: Diagnosis not present

## 2021-11-25 DIAGNOSIS — H903 Sensorineural hearing loss, bilateral: Secondary | ICD-10-CM | POA: Diagnosis not present

## 2021-11-27 DIAGNOSIS — F4320 Adjustment disorder, unspecified: Secondary | ICD-10-CM | POA: Diagnosis not present

## 2021-11-27 DIAGNOSIS — L821 Other seborrheic keratosis: Secondary | ICD-10-CM | POA: Diagnosis not present

## 2021-11-27 DIAGNOSIS — Z630 Problems in relationship with spouse or partner: Secondary | ICD-10-CM | POA: Diagnosis not present

## 2021-11-27 DIAGNOSIS — L578 Other skin changes due to chronic exposure to nonionizing radiation: Secondary | ICD-10-CM | POA: Diagnosis not present

## 2021-11-29 ENCOUNTER — Other Ambulatory Visit: Payer: Self-pay | Admitting: Sports Medicine

## 2021-12-02 DIAGNOSIS — Z9189 Other specified personal risk factors, not elsewhere classified: Secondary | ICD-10-CM | POA: Diagnosis not present

## 2021-12-02 DIAGNOSIS — K900 Celiac disease: Secondary | ICD-10-CM | POA: Diagnosis not present

## 2021-12-02 DIAGNOSIS — E612 Magnesium deficiency: Secondary | ICD-10-CM | POA: Diagnosis not present

## 2021-12-02 DIAGNOSIS — M2550 Pain in unspecified joint: Secondary | ICD-10-CM | POA: Diagnosis not present

## 2021-12-04 DIAGNOSIS — Z630 Problems in relationship with spouse or partner: Secondary | ICD-10-CM | POA: Diagnosis not present

## 2021-12-04 DIAGNOSIS — F4320 Adjustment disorder, unspecified: Secondary | ICD-10-CM | POA: Diagnosis not present

## 2021-12-13 DIAGNOSIS — H905 Unspecified sensorineural hearing loss: Secondary | ICD-10-CM | POA: Diagnosis not present

## 2021-12-13 DIAGNOSIS — R42 Dizziness and giddiness: Secondary | ICD-10-CM | POA: Diagnosis not present

## 2021-12-18 DIAGNOSIS — Z630 Problems in relationship with spouse or partner: Secondary | ICD-10-CM | POA: Diagnosis not present

## 2021-12-18 DIAGNOSIS — F4320 Adjustment disorder, unspecified: Secondary | ICD-10-CM | POA: Diagnosis not present

## 2022-02-06 ENCOUNTER — Encounter: Payer: Self-pay | Admitting: Sports Medicine

## 2022-03-24 IMAGING — DX DG SHOULDER 2+V*R*
3 series · 3 of 3 positions shown · non-contrast
Comparison: None.

CLINICAL DATA: Pt has a several month history of right shoulder
pain, localized over the deltoid worse with abduction

EXAM:
RIGHT SHOULDER - 2+ VIEW

[shoulder grashey]
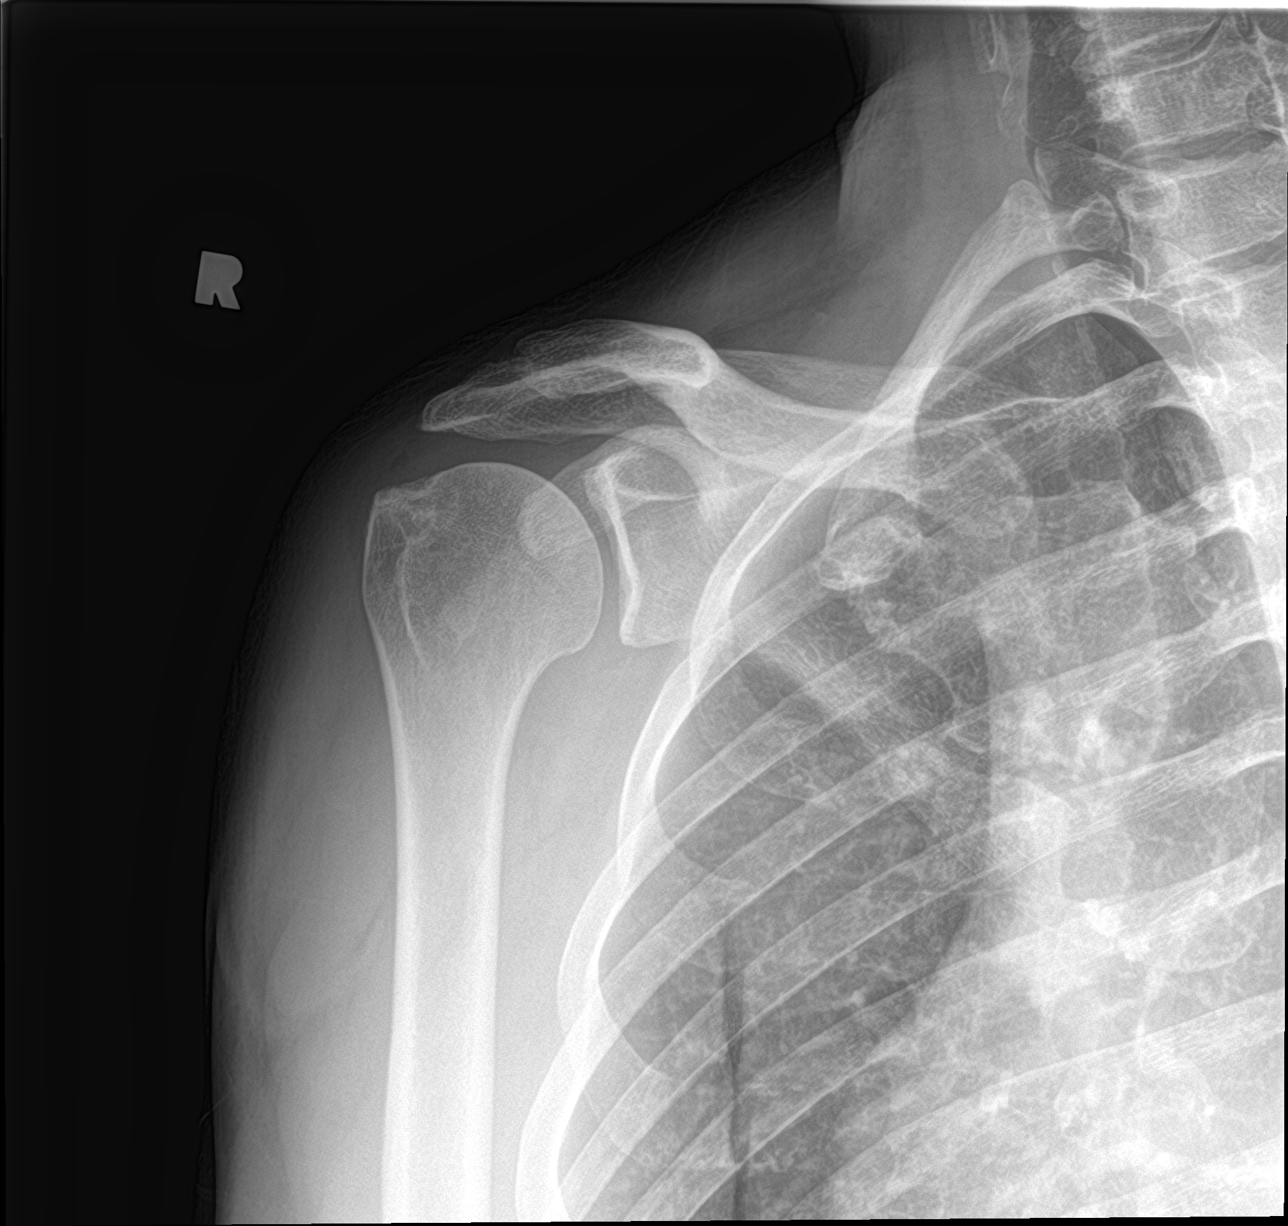

[shoulder y view]
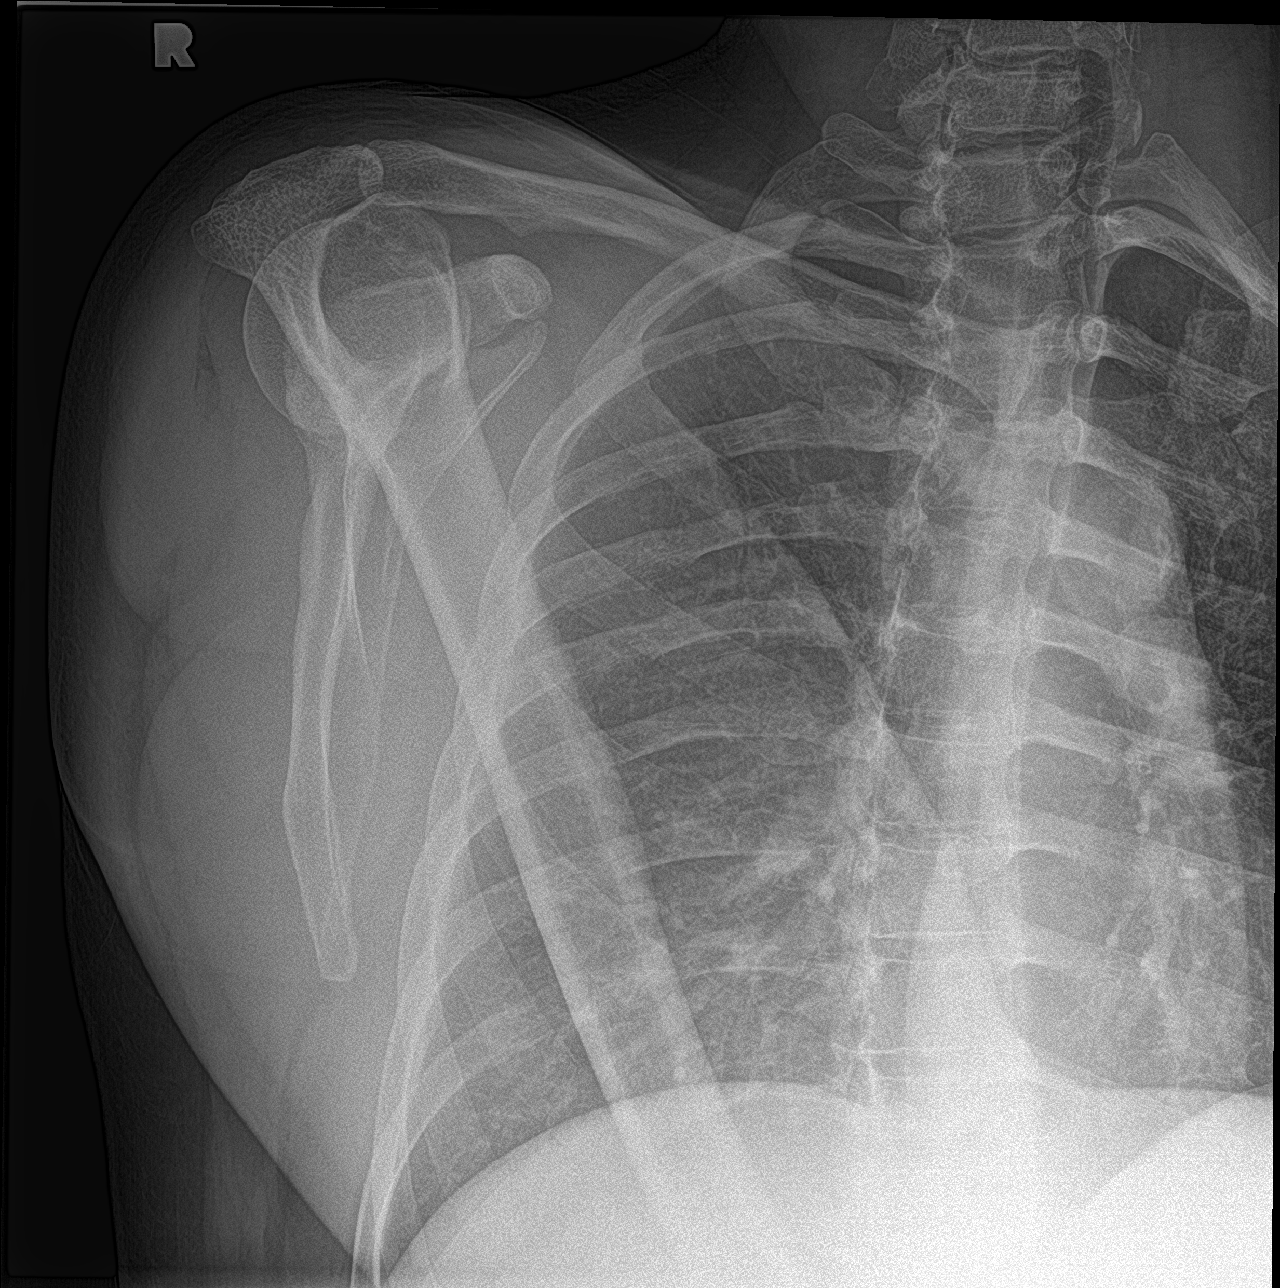

[shoulder axillary]
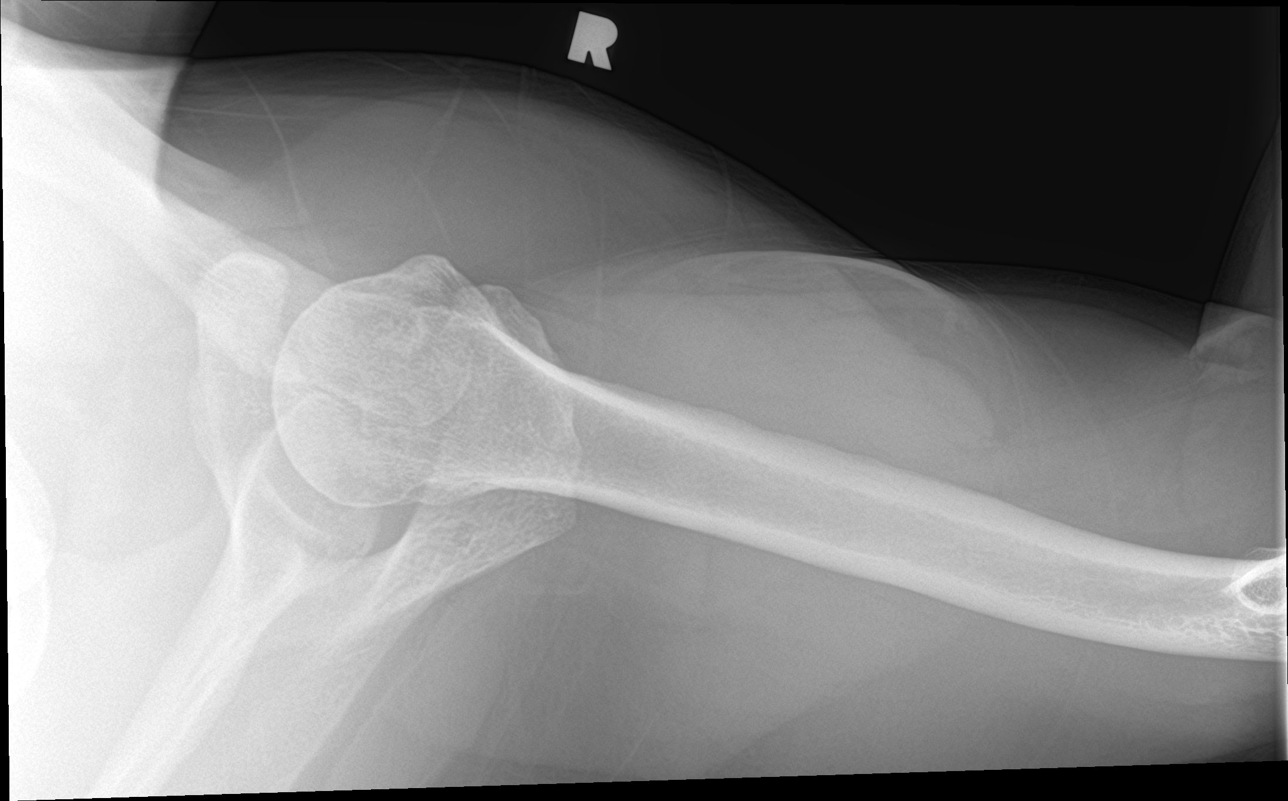

[3 of 3 positions shown; findings below may reference images not displayed]

FINDINGS: There is no evidence of fracture or dislocation. There is no
evidence of arthropathy or other focal bone abnormality. Soft
tissues are unremarkable.
IMPRESSION: Negative.

## 2022-05-21 ENCOUNTER — Telehealth: Payer: Self-pay

## 2022-05-21 ENCOUNTER — Ambulatory Visit
Admission: EM | Admit: 2022-05-21 | Discharge: 2022-05-21 | Disposition: A | Discharge status: Home / Self Care | Payer: BC Managed Care – PPO

## 2022-05-21 DIAGNOSIS — H66002 Acute suppurative otitis media without spontaneous rupture of ear drum, left ear: Secondary | ICD-10-CM

## 2022-05-21 DIAGNOSIS — H9202 Otalgia, left ear: Secondary | ICD-10-CM

## 2022-05-21 MED ORDER — ACETAMINOPHEN 325 MG PO TABS
650.0000 mg | ORAL_TABLET | Freq: Once | ORAL | Status: AC
  Administered 2022-05-21: 650 mg via ORAL

## 2022-05-21 MED ORDER — AMOXICILLIN 875 MG PO TABS: 875.0000 mg | ORAL_TABLET | Freq: Two times a day (BID) | ORAL | 0 refills | Status: DC

## 2022-05-21 NOTE — Discharge Instructions (Signed)
Continue daily sinus rinse Drink lots of water Take antibiotic 2 times a day for a week Follow-up with your usual provider

## 2022-05-21 NOTE — ED Triage Notes (Signed)
Pt c/o LT ear pain that started last night. Pain radiates to jaw and back of ear. Denies fever. No meds tried.

## 2022-05-22 ENCOUNTER — Telehealth: Payer: Self-pay | Admitting: Emergency Medicine

## 2022-05-22 NOTE — Telephone Encounter (Signed)
Just calling to check to see if you are feeling better. Give [place] a call if you have any questions

## 2022-08-05 ENCOUNTER — Ambulatory Visit (INDEPENDENT_AMBULATORY_CARE_PROVIDER_SITE_OTHER): Payer: BC Managed Care – PPO | Admitting: Sports Medicine

## 2022-08-05 ENCOUNTER — Encounter: Payer: Self-pay | Admitting: Sports Medicine

## 2022-08-05 VITALS — BP 127/76 | HR 88 | Wt 200.0 lb

## 2022-08-05 DIAGNOSIS — E7800 Pure hypercholesterolemia, unspecified: Secondary | ICD-10-CM

## 2022-08-05 DIAGNOSIS — R739 Hyperglycemia, unspecified: Secondary | ICD-10-CM | POA: Diagnosis not present

## 2022-08-05 DIAGNOSIS — N139 Obstructive and reflux uropathy, unspecified: Secondary | ICD-10-CM

## 2022-08-05 DIAGNOSIS — H9311 Tinnitus, right ear: Secondary | ICD-10-CM

## 2022-08-05 DIAGNOSIS — F411 Generalized anxiety disorder: Secondary | ICD-10-CM

## 2022-08-05 DIAGNOSIS — Z0000 Encounter for general adult medical examination without abnormal findings: Secondary | ICD-10-CM | POA: Diagnosis not present

## 2022-08-05 DIAGNOSIS — E291 Testicular hypofunction: Secondary | ICD-10-CM

## 2022-08-05 NOTE — Assessment & Plan Note (Signed)
Tympanostomy tube placement with ENT, further management per ENT.

## 2022-08-05 NOTE — Progress Notes (Signed)
Subjective:    CC: Annual Physical Exam  HPI:  This patient is here for their annual physical  I reviewed the past medical history, family history, social history, surgical history, and allergies today and no changes were needed.  Please see the problem list section below in epic for further details.  Past Medical History: Past Medical History:  Diagnosis Date   Anxiety    Arthritis    Celiac disease    Hand fracture    Headache    Hyperlipidemia    Lyme disease    Migraines    Normal cardiac stress test 4/10   at [facility] - negative.  terminated for fatigue   Past Surgical History: Past Surgical History:  Procedure Laterality Date   surgery on right hand Bilateral    Social History: Social History   Socioeconomic History   Marital status: Divorced    Spouse name: Not on file   Number of children: 1   Years of education: college   Highest education level: Not on file  Occupational History   Occupation: Quarry manager for [employer]  Tobacco Use   Smoking status: Former    Packs/day: 0.20    Types: Cigarettes   Smokeless tobacco: Never   Tobacco comments:    started smoking at age 50, started smokeless tobacco at age 54  Vaping Use   Vaping Use: Some days  Substance and Sexual Activity   Alcohol use: No   Drug use: No   Sexual activity: Not on file  Other Topics Concern   Not on file  Social History Narrative   17 years of education   Married to [place] with 1 son   Mother in law lives in the home   Right-handed.   No daily caffeine use.    Social Determinants of Health   Financial Resource Strain: Not on file  Food Insecurity: Not on file  Transportation Needs: Not on file  Physical Activity: Not on file  Stress: Not on file  Social Connections: Not on file   Family History: Family History  Problem Relation Age of Onset   Breast cancer Mother    Hyperlipidemia Mother    Hypertension Father    Hyperlipidemia Father    Other Other        grandfather  with alcoholism   Heart attack Other        grandmother   Diabetes Other        grandmother   Colon cancer Other        great uncles on Dads side   Breast cancer Other        aunt   Lung cancer Other        grandmother   Prostate cancer Other        grandfather   Melanoma Other        uncle   Esophageal cancer Neg Hx    Rectal cancer Neg Hx    Stomach cancer Neg Hx    Allergies: Allergies  Allergen Reactions   Sulfa Antibiotics Other (See Comments)    headaches   Egg White (Diagnostic)    Gluten Meal     GI issues   Naproxen Sodium Swelling   Peanut-Containing Drug Products    Salmon [Fish Oil]    Barley Grass Dermatitis, Diarrhea, Nausea And Vomiting and Rash   Corn-Containing Products Dermatitis, Diarrhea, Nausea And Vomiting and Rash   Crab (Diagnostic) Dermatitis, Diarrhea, Nausea And Vomiting and Rash   Oat Grain (Diagnostic)  Dermatitis, Diarrhea, Nausea And Vomiting and Rash   Other Dermatitis, Nausea And Vomiting, Rash and Diarrhea   Shellfish Allergy Dermatitis, Diarrhea, Nausea And Vomiting and Rash   Shrimp (Diagnostic) Dermatitis, Diarrhea, Itching and Rash   Medications: See med rec.  Review of Systems: No headache, visual changes, nausea, vomiting, diarrhea, constipation, dizziness, abdominal pain, skin rash, fevers, chills, night sweats, swollen lymph nodes, weight loss, chest pain, body aches, joint swelling, muscle aches, shortness of breath, mood changes, visual or auditory hallucinations.  Objective:    General: Well Developed, well nourished, and in no acute distress.  Neuro: Alert and oriented x3, extra-ocular muscles intact, sensation grossly intact. Cranial nerves II through XII are intact, motor, sensory, and coordinative functions are all intact. HEENT: Normocephalic, atraumatic, pupils equal round reactive to light, neck supple, no masses, no lymphadenopathy, thyroid nonpalpable. Oropharynx, nasopharynx, external ear canals are unremarkable with  exception of tympanostomy tube right eardrum. Skin: Warm and dry, no rashes noted.  Cardiac: Regular rate and rhythm, no murmurs rubs or gallops.  Respiratory: Clear to auscultation bilaterally. Not using accessory muscles, speaking in full sentences.  Abdominal: Soft, nontender, nondistended, positive bowel sounds, no masses, no organomegaly.  Musculoskeletal: Shoulder, elbow, wrist, hip, knee, ankle stable, and with full range of motion.  Impression and Recommendations:    The patient was counselled, risk factors were discussed, anticipatory guidance given.  Annual physical exam Annual physical as above, declines Tdap and flu, he will look into Tdap today. Checking routine labs.  Chronic migraine Does get what sounds to be migraines, right-sided facial numbness and tingling, occasional droop consistent with a complex migraine, he was seeing a neurologist, brain MRI normal with the exception of a right frontal lobe potential microhemorrhage versus artifact. We will watch this for now.  Generalized anxiety manifesting as headache Historically symptoms were controlled with BuSpar, he has had multiple symptoms including episodes of shaking, nausea and vomiting that sound more like a panic attack. It sounds like Valium twice a day, ondansetron and occasional hydroxyzine controls his symptoms, but I am happy to refill this in the future.  Male hypogonadism Currently getting testosterone supplementation with [name], I am going to add a PSA to his routine labs today.  Tinnitus, right Tympanostomy tube placement with ENT, further management per ENT.   ____________________________________________ Gwen Her. Dianah Field, M.D., ABFM., CAQSM., AME. Primary Care and Sports Medicine [facility]  Adjunct Professor of [place] [employer] [facility] [employer]  Risk manager

## 2022-08-05 NOTE — Assessment & Plan Note (Signed)
Historically symptoms were controlled with BuSpar, he has had multiple symptoms including episodes of shaking, nausea and vomiting that sound more like a panic attack. It sounds like Valium twice a day, ondansetron and occasional hydroxyzine controls his symptoms, but I am happy to refill this in the future.

## 2022-08-05 NOTE — Assessment & Plan Note (Signed)
Does get what sounds to be migraines, right-sided facial numbness and tingling, occasional droop consistent with a complex migraine, he was seeing a neurologist, brain MRI normal with the exception of a right frontal lobe potential microhemorrhage versus artifact. We will watch this for now.

## 2022-08-05 NOTE — Assessment & Plan Note (Signed)
Annual physical as above, declines Tdap and flu, he will look into Tdap today. Checking routine labs.

## 2022-08-05 NOTE — Assessment & Plan Note (Signed)
Currently getting testosterone supplementation with [name], I am going to add a PSA to his routine labs today.

## 2022-08-06 ENCOUNTER — Encounter: Payer: Self-pay | Admitting: Sports Medicine

## 2022-08-07 LAB — COMPLETE METABOLIC PANEL WITH GFR
AG Ratio: 1.5 (calc) (ref 1.0–2.5)
ALT: 19 U/L (ref 9–46)
AST: 19 U/L (ref 10–40)
Albumin: 4.5 g/dL (ref 3.6–5.1)
Alkaline phosphatase (APISO): 61 U/L (ref 36–130)
BUN: 9 mg/dL (ref 7–25)
CO2: 28 mmol/L (ref 20–32)
Calcium: 9.6 mg/dL (ref 8.6–10.3)
Chloride: 104 mmol/L (ref 98–110)
Creat: 0.76 mg/dL (ref 0.60–1.29)
Globulin: 3 g/dL (calc) (ref 1.9–3.7)
Glucose, Bld: 93 mg/dL (ref 65–99)
Potassium: 4.6 mmol/L (ref 3.5–5.3)
Sodium: 139 mmol/L (ref 135–146)
Total Bilirubin: 0.5 mg/dL (ref 0.2–1.2)
Total Protein: 7.5 g/dL (ref 6.1–8.1)
eGFR: 112 mL/min/{1.73_m2} (ref >=60)

## 2022-08-07 LAB — HEMOGLOBIN A1C
Hgb A1c MFr Bld: 5.7 % of total Hgb — ABNORMAL HIGH (ref <5.7)
Mean Plasma Glucose: 117 mg/dL
eAG (mmol/L): 6.5 mmol/L

## 2022-08-07 LAB — LIPID PANEL
Cholesterol: 214 mg/dL — ABNORMAL HIGH (ref <200)
HDL: 55 mg/dL (ref >=40)
LDL Cholesterol (Calc): 140 mg/dL (calc) — ABNORMAL HIGH
Non-HDL Cholesterol (Calc): 159 mg/dL (calc) — ABNORMAL HIGH (ref <130)
Total CHOL/HDL Ratio: 3.9 (calc) (ref <5.0)
Triglycerides: 90 mg/dL (ref <150)

## 2022-08-07 LAB — CBC
HCT: 48.8 % (ref 38.5–50.0)
Hemoglobin: 16.4 g/dL (ref 13.2–17.1)
MCH: 27.9 pg (ref 27.0–33.0)
MCHC: 33.6 g/dL (ref 32.0–36.0)
MCV: 83.1 fL (ref 80.0–100.0)
MPV: 10.8 fL (ref 7.5–12.5)
Platelets: 256 10*3/uL (ref 140–400)
RBC: 5.87 10*6/uL — ABNORMAL HIGH (ref 4.20–5.80)
RDW: 12.7 % (ref 11.0–15.0)
WBC: 5.3 10*3/uL (ref 3.8–10.8)

## 2022-08-07 LAB — TSH: TSH: 1.88 mIU/L (ref 0.40–4.50)

## 2022-08-07 LAB — PSA, TOTAL AND FREE
PSA, % Free: 67 % (calc) (ref >25)
PSA, Free: 0.4 ng/mL
PSA, Total: 0.6 ng/mL (ref <=4.0)

## 2022-08-12 ENCOUNTER — Encounter: Payer: Self-pay | Admitting: Sports Medicine

## 2022-08-13 ENCOUNTER — Encounter: Payer: Self-pay | Admitting: Sports Medicine

## 2022-08-20 ENCOUNTER — Telehealth: Payer: BC Managed Care – PPO | Admitting: Family Medicine

## 2022-08-20 DIAGNOSIS — J020 Streptococcal pharyngitis: Secondary | ICD-10-CM | POA: Diagnosis not present

## 2022-08-20 MED ORDER — AMOXICILLIN 500 MG PO TABS
500.0000 mg | ORAL_TABLET | Freq: Two times a day (BID) | ORAL | 0 refills | Status: AC
Start: 2022-08-20 — End: 2022-08-30

## 2022-08-20 NOTE — Progress Notes (Signed)
Virtual Visit Consent   Brian Spears, you are scheduled for a virtual visit with a [facility] provider today. Just as with appointments in the office, your consent must be obtained to participate. Your consent will be active for this visit and any virtual visit you may have with one of our providers in the next 365 days. If you have a MyChart account, a copy of this consent can be sent to you electronically.  As this is a virtual visit, video technology does not allow for your provider to perform a traditional examination. This may limit your provider's ability to fully assess your condition. If your provider identifies any concerns that need to be evaluated in person or the need to arrange testing (such as labs, EKG, etc.), we will make arrangements to do so. Although advances in technology are sophisticated, we cannot ensure that it will always work on either your end or our end. If the connection with a video visit is poor, the visit may have to be switched to a telephone visit. With either a video or telephone visit, we are not always able to ensure that we have a secure connection.  By engaging in this virtual visit, you consent to the provision of healthcare and authorize for your insurance to be billed (if applicable) for the services provided during this visit. Depending on your insurance coverage, you may receive a charge related to this service.  I need to obtain your verbal consent now. Are you willing to proceed with your visit today? Brian Spears has provided verbal consent on 08/20/2022 for a virtual visit (video or telephone). Perlie Mayo, NP  Date: 08/20/2022 10:46 AM  Virtual Visit via Video Note   I, Perlie Mayo, connected with  Brian Spears  ([id number], 14-Nov-1975) on 08/20/22 at 11:30 AM EST by a video-enabled telemedicine application and verified that I am speaking with the correct person using two identifiers.  Location: Patient: Virtual Visit Location Patient:  Home Provider: Virtual Visit Location Provider: Home Office   I discussed the limitations of evaluation and management by telemedicine and the availability of in person appointments. The patient expressed understanding and agreed to proceed.    History of Present Illness: Brian Spears is a 46 y.o. who identifies as a male who was assigned male at birth, and is being seen today for strep in home- with children. Symptoms started Monday- was thinking it was related to weather changes at first. Fever 100, white patches and swelling, dry cough- mild pressure in nasal passages.   Problems:  Patient Active Problem List   Diagnosis Date Noted   History of Lyme disease 03/25/2019   Chronic migraine 03/10/2019   Generalized anxiety manifesting as headache 01/18/2019   Lateral epicondylitis, right elbow 12/15/2018   Plantar fasciitis, left 03/05/2018   Right shoulder pain 07/02/2017   Tinnitus, right 06/09/2016   Medial epicondylitis of right elbow 02/18/2016   Onychodystrophy 12/05/2014   Primary osteoarthritis of both elbows 12/05/2014   Elevated hemoglobin ([place]) 08/01/2013   Male hypogonadism 06/01/2013   GERD (gastroesophageal reflux disease) 03/01/2013   History of smoking 06/07/2012   Annual physical exam 06/07/2012   Celiac disease 06/30/2011   Hyperlipidemia 09/27/2010   Bilateral carpal tunnel syndrome 09/27/2010    Allergies:  Allergies  Allergen Reactions   Sulfa Antibiotics Other (See Comments)    headaches   Egg White (Diagnostic)    Gluten Meal     GI issues   Naproxen  Sodium Swelling   Peanut-Containing Drug Products    Salmon [Fish Oil]    Barley Grass Dermatitis, Diarrhea, Nausea And Vomiting and Rash   Corn-Containing Products Dermatitis, Diarrhea, Nausea And Vomiting and Rash   Crab (Diagnostic) Dermatitis, Diarrhea, Nausea And Vomiting and Rash   Oat Grain (Diagnostic) Dermatitis, Diarrhea, Nausea And Vomiting and Rash   Other Dermatitis, Nausea And Vomiting,  Rash and Diarrhea   Shellfish Allergy Dermatitis, Diarrhea, Nausea And Vomiting and Rash   Shrimp (Diagnostic) Dermatitis, Diarrhea, Itching and Rash   Medications:  Current Outpatient Medications:    amoxicillin (AMOXIL) 875 MG tablet, Take 1 tablet (875 mg total) by mouth 2 (two) times daily., Disp: 14 tablet, Rfl: 0   hydrOXYzine (VISTARIL) 25 MG capsule, Take 1 capsule (25 mg total) by mouth every 8 (eight) hours as needed., Disp: 30 capsule, Rfl: 0   itraconazole (SPORANOX) 100 MG capsule, Take 100 mg by mouth 2 (two) times daily., Disp: , Rfl:    magic mouthwash w/lidocaine SOLN, Take 15 mLs by mouth 4 (four) times daily as needed for mouth pain. Swish, gargle and spit., Disp: 500 mL, Rfl: 11   Multiple Vitamins-Minerals (ZINC PO), Take by mouth daily., Disp: , Rfl:    nystatin (MYCOSTATIN) 100000 UNIT/ML suspension, Take 5 mLs (500,000 Units total) by mouth 4 (four) times daily. Swish for 30 seconds and spit out., Disp: 180 mL, Rfl: 0   OMEGA-3 FATTY ACIDS PO, Take by mouth., Disp: , Rfl:    ondansetron (ZOFRAN-ODT) 8 MG disintegrating tablet, Take 8 mg by mouth 3 (three) times daily., Disp: , Rfl:    tadalafil (CIALIS) 5 MG tablet, Take 5 mg by mouth daily as needed for erectile dysfunction., Disp: , Rfl:    testosterone cypionate (DEPOTESTOSTERONE CYPIONATE) 200 MG/ML injection, SMARTSIG:0.5 Milliliter(s) IM Once a Week, Disp: , Rfl:    thyroid (NP THYROID) 15 MG tablet, Take 15 mg by mouth daily., Disp: , Rfl:    triamcinolone cream (KENALOG) 0.5 %, Apply 1 application topically 2 (two) times daily. To affected areas. (Patient taking differently: Apply 1 application  topically 2 (two) times daily as needed. To affected areas.), Disp: 30 g, Rfl: 3   valACYclovir (VALTREX) 1000 MG tablet, Take 1,000 mg by mouth 2 (two) times daily., Disp: , Rfl:   Observations/Objective: Patient is well-developed, well-nourished in no acute distress.  Resting comfortably  at home.  Head is  normocephalic, atraumatic.  No labored breathing.  Speech is clear and coherent with logical content.  Patient is alert and oriented at baseline.    Assessment and Plan:  1. Strep pharyngitis  - amoxicillin (AMOXIL) 500 MG tablet; Take 1 tablet (500 mg total) by mouth 2 (two) times daily for 10 days.  Dispense: 20 tablet; Refill: 0  -take meds as ordered -keep throat moist with warm fluids   Reviewed side effects, risks and benefits of medication.    Patient acknowledged agreement and understanding of the plan.   Past Medical, Surgical, Social History, Allergies, and Medications have been Reviewed.    Follow Up Instructions: I discussed the assessment and treatment plan with the patient. The patient was provided an opportunity to ask questions and all were answered. The patient agreed with the plan and demonstrated an understanding of the instructions.  A copy of instructions were sent to the patient via MyChart unless otherwise noted below.     The patient was advised to call back or seek an in-person evaluation if the symptoms worsen or  if the condition fails to improve as anticipated.  Time:  I spent 10 minutes with the patient via telehealth technology discussing the above problems/concerns.    Perlie Mayo, NP

## 2022-08-20 NOTE — Patient Instructions (Signed)
Brian Spears, thank you for joining Perlie Mayo, NP for today's virtual visit.  While this provider is not your primary care provider (PCP), if your PCP is located in our provider database this encounter information will be shared with them immediately following your visit.   [facility] account gives you access to today's visit and all your visits, tests, and labs performed at [facility] " click here if you don't have a [facility] account or go to [email or web address]  Consent: (Patient) Brian Spears provided verbal consent for this virtual visit at the beginning of the encounter.  Current Medications:  Current Outpatient Medications:    amoxicillin (AMOXIL) 500 MG tablet, Take 1 tablet (500 mg total) by mouth 2 (two) times daily for 10 days., Disp: 20 tablet, Rfl: 0   hydrOXYzine (VISTARIL) 25 MG capsule, Take 1 capsule (25 mg total) by mouth every 8 (eight) hours as needed., Disp: 30 capsule, Rfl: 0   itraconazole (SPORANOX) 100 MG capsule, Take 100 mg by mouth 2 (two) times daily., Disp: , Rfl:    magic mouthwash w/lidocaine SOLN, Take 15 mLs by mouth 4 (four) times daily as needed for mouth pain. Swish, gargle and spit., Disp: 500 mL, Rfl: 11   Multiple Vitamins-Minerals (ZINC PO), Take by mouth daily., Disp: , Rfl:    nystatin (MYCOSTATIN) 100000 UNIT/ML suspension, Take 5 mLs (500,000 Units total) by mouth 4 (four) times daily. Swish for 30 seconds and spit out., Disp: 180 mL, Rfl: 0   OMEGA-3 FATTY ACIDS PO, Take by mouth., Disp: , Rfl:    ondansetron (ZOFRAN-ODT) 8 MG disintegrating tablet, Take 8 mg by mouth 3 (three) times daily., Disp: , Rfl:    tadalafil (CIALIS) 5 MG tablet, Take 5 mg by mouth daily as needed for erectile dysfunction., Disp: , Rfl:    testosterone cypionate (DEPOTESTOSTERONE CYPIONATE) 200 MG/ML injection, SMARTSIG:0.5 Milliliter(s) IM Once a Week, Disp: , Rfl:    thyroid (NP THYROID) 15 MG tablet, Take 15 mg by mouth  daily., Disp: , Rfl:    triamcinolone cream (KENALOG) 0.5 %, Apply 1 application topically 2 (two) times daily. To affected areas. (Patient taking differently: Apply 1 application  topically 2 (two) times daily as needed. To affected areas.), Disp: 30 g, Rfl: 3   valACYclovir (VALTREX) 1000 MG tablet, Take 1,000 mg by mouth 2 (two) times daily., Disp: , Rfl:    Medications ordered in this encounter:  Meds ordered this encounter  Medications   amoxicillin (AMOXIL) 500 MG tablet    Sig: Take 1 tablet (500 mg total) by mouth 2 (two) times daily for 10 days.    Dispense:  20 tablet    Refill:  0    Order Specific Question:   Supervising Provider    Answer:   Chase Picket [id number]     *If you need refills on other medications prior to your next appointment, please contact your pharmacy*  Follow-Up: Call back or seek an in-person evaluation if the symptoms worsen or if the condition fails to improve as anticipated.  [place] [phone]  Other Instructions Strep Throat, Adult Strep throat is an infection of the throat. It is caused by germs (bacteria). Strep throat is common during the cold months of the year. It mostly affects children who are 57-33 years old. However, people of all ages can get it at any time of the year. This infection spreads from person to person through coughing,  sneezing, or having close contact. What are the causes? This condition is caused by the Streptococcus pyogenes germ. What increases the risk? You care for young children. Children are more likely to get strep throat and may spread it to others. You go to crowded places. Germs can spread easily in such places. You kiss or touch someone who has strep throat. What are the signs or symptoms? Fever or chills. Redness, swelling, or pain in the tonsils or throat. Pain or trouble when swallowing. White or yellow spots on the tonsils or throat. Tender glands in the neck and under the  jaw. Bad breath. Red rash all over the body. This is rare. How is this treated? Medicines that kill germs (antibiotics). Medicines that treat pain or fever. These include: Ibuprofen or acetaminophen. Aspirin, only for people who are over the age of 64. Cough drops. Throat sprays. Follow these instructions at home: Medicines  Take over-the-counter and prescription medicines only as told by your doctor. Take your antibiotic medicine as told by your doctor. Do not stop taking the antibiotic even if you start to feel better. Eating and drinking  If you have trouble swallowing, eat soft foods until your throat feels better. Drink enough fluid to keep your pee (urine) pale yellow. To help with pain, you may have: Warm fluids, such as soup and tea. Cold fluids, such as frozen desserts or popsicles. General instructions Rinse your mouth (gargle) with a salt-water mixture 3-4 times a day or as needed. To make a salt-water mixture, dissolve -1 tsp (3-6 g) of salt in 1 cup (237 mL) of warm water. Rest as much as you can. Stay home from work or school until you have been taking antibiotics for 24 hours. Do not smoke or use any products that contain nicotine or tobacco. If you need help quitting, ask your doctor. Keep all follow-up visits. How is this prevented?  Do not share food, drinking cups, or personal items. They can cause the germs to spread. Wash your hands well with soap and water. Make sure that all people in your house wash their hands well. Have family members tested if they have a fever or a sore throat. They may need an antibiotic if they have strep throat. Contact a doctor if: You have swelling in your neck that keeps getting bigger. You get a rash, cough, or earache. You cough up a thick fluid that is green, yellow-brown, or bloody. You have pain that does not get better with medicine. Your symptoms get worse instead of getting better. You have a fever. Get help right  away if: You vomit. You have a very bad headache. Your neck hurts or feels stiff. You have chest pain or are short of breath. You have drooling, very bad throat pain, or changes in your voice. Your neck is swollen, or the skin gets red and tender. Your mouth is dry, or you are peeing less than normal. You keep feeling more tired or have trouble waking up. Your joints are red or painful. These symptoms may be an emergency. Do not wait to see if the symptoms will go away. Get help right away. Call your local emergency services (911 in the [place] Summary Strep throat is an infection of the throat. It is caused by germs (bacteria). This infection can spread from person to person through coughing, sneezing, or having close contact. Take your medicines, including antibiotics, as told by your doctor. Do not stop taking the antibiotic even if you start to  feel better. To prevent the spread of germs, wash your hands well with soap and water. Have others do the same. Do not share food, drinking cups, or personal items. Get help right away if you have a bad headache, chest pain, shortness of breath, a stiff or painful neck, or you vomit. This information is not intended to replace advice given to you by your health care provider. Make sure you discuss any questions you have with your health care provider. Document Revised: 12/11/2020 Document Reviewed: 12/11/2020 Elsevier Patient Education  [place].     If you have been instructed to have an in-person evaluation today at a local Urgent Care facility, please use the link below. It will take you to a list of all of our available [facility], including address, phone number and hours of operation. Please do not delay care.  [facility]  If you or a family member do not have a primary care provider, use the link below to schedule a visit and establish care. When you choose a [facility] primary care physician or  advanced practice provider, you gain a long-term partner in health. Find a Primary Care Provider  Learn more about [facility]'s in-office and virtual care options: [facility]

## 2023-04-06 ENCOUNTER — Encounter: Payer: Self-pay | Admitting: Gastroenterology

## 2023-04-20 ENCOUNTER — Encounter: Payer: Self-pay | Admitting: Sports Medicine

## 2023-04-20 ENCOUNTER — Encounter: Payer: Self-pay | Admitting: Gastroenterology

## 2023-04-20 ENCOUNTER — Ambulatory Visit (INDEPENDENT_AMBULATORY_CARE_PROVIDER_SITE_OTHER): Payer: Medicaid Other | Admitting: Sports Medicine

## 2023-04-20 VITALS — BP 130/87 | HR 91 | Ht 68.0 in | Wt 203.0 lb

## 2023-04-20 DIAGNOSIS — H9311 Tinnitus, right ear: Secondary | ICD-10-CM

## 2023-04-20 DIAGNOSIS — D582 Other hemoglobinopathies: Secondary | ICD-10-CM | POA: Diagnosis not present

## 2023-04-20 DIAGNOSIS — E291 Testicular hypofunction: Secondary | ICD-10-CM | POA: Diagnosis not present

## 2023-04-20 DIAGNOSIS — F411 Generalized anxiety disorder: Secondary | ICD-10-CM

## 2023-04-20 MED ORDER — ONDANSETRON 8 MG PO TBDP: 8.0000 mg | ORAL_TABLET | Freq: Three times a day (TID) | ORAL | 11 refills | Status: AC

## 2023-04-20 MED ORDER — LORAZEPAM 1 MG PO TABS: 1.0000 mg | ORAL_TABLET | Freq: Two times a day (BID) | ORAL | 3 refills | Status: DC | PRN

## 2023-04-20 MED ORDER — HYDROXYZINE PAMOATE 25 MG PO CAPS: 25.0000 mg | ORAL_CAPSULE | Freq: Three times a day (TID) | ORAL | 0 refills | Status: DC | PRN

## 2023-04-20 MED ORDER — TESTOSTERONE CYPIONATE 200 MG/ML IM SOLN: 50.0000 mg | INTRAMUSCULAR | 3 refills | Status: DC

## 2023-04-20 NOTE — Assessment & Plan Note (Signed)
Historically well-controlled with hydroxyzine, lorazepam, ondansetron. Refilling these.

## 2023-04-20 NOTE — Assessment & Plan Note (Signed)
This patient has always had normal hemoglobins, sometimes elevated. Again he told me [name] has been treating him for anemia. The last hemoglobins we have for him were in the 17s. He tells me his [name] virus titers were elevated, I did advise that this can be a normal finding in adults. I have advised that if they did find anemia he certainly needs to be evaluated by gastroenterology for GI bleed versus colon malignancy. He tells me he does have an appointment coming up and he plans to keep it, he is aware of the risks. Rechecking iron indices and EPO levels. He does understand that testosterone supplementation can increase his hemoglobin.

## 2023-04-20 NOTE — Assessment & Plan Note (Signed)
Currently doing testosterone 0.25 mL twice a week. Historically prescribed [name], am happy to take this over though he does understand he will need to do CBC, PSA, testosterone levels twice a year.

## 2023-04-20 NOTE — Assessment & Plan Note (Signed)
Has had longstanding tinnitus of right ear, he tells me he was diagnosed with Mnire's disease, he does have a tube in the right ear today. He would like a second opinion from a different ENT closer to where he lives. I will facilitate this.

## 2023-04-20 NOTE — Assessment & Plan Note (Signed)
Brian Spears is having worsening headaches, he does have a neurologist, he plans to make an appointment

## 2023-04-20 NOTE — Progress Notes (Signed)
    Procedures performed today:    None.  Independent interpretation of notes and tests performed by another provider:   None.  Brief History, Exam, Impression, and Recommendations:    Chronic migraine Brian Spears is having worsening headaches, he does have a neurologist, he plans to make an appointment  Male hypogonadism Currently doing testosterone 0.25 mL twice a week. Historically prescribed Brian Spears, am happy to take this over though he does understand he will need to do CBC, PSA, testosterone levels twice a year.  Tinnitus, right Has had longstanding tinnitus of right ear, he tells me he was diagnosed with Mnire's disease, he does have a tube in the right ear today. He would like a second opinion from a different ENT closer to where he lives. I will facilitate this.  Elevated hemoglobin This patient has always had normal hemoglobins, sometimes elevated. Again he told me Brian Spears integr[facility] medicine has been treating him for anemia. The last hemoglobins we have for him were in the 17s. He tells me his [name] virus titers were elevated, I did advise that this can be a normal finding in adults. I have advised that if they did find anemia he certainly needs to be evaluated by gastroenterology for GI bleed versus colon malignancy. He tells me he does have an appointment coming up and he plans to keep it, he is aware of the risks. Rechecking iron indices and EPO levels. He does understand that testosterone supplementation can increase his hemoglobin.  Generalized anxiety manifesting as headache Historically well-controlled with hydroxyzine, lorazepam, ondansetron. Refilling these.    ____________________________________________ Ihor Austin. Benjamin Stain, M.D., ABFM., CAQSM., AME. Primary Care and Sports Medicine [facility]  Adjunct Professor of Family Medicine  [employer]  Restaurant manager, fast food

## 2023-04-21 LAB — CBC
Hematocrit: 50.2 % (ref 37.5–51.0)
Hemoglobin: 16.7 g/dL (ref 13.0–17.7)
MCH: 27.5 pg (ref 26.6–33.0)
MCHC: 33.3 g/dL (ref 31.5–35.7)
MCV: 83 fL (ref 79–97)
Platelets: 281 10*3/uL (ref 150–450)
RBC: 6.08 x10E6/uL — ABNORMAL HIGH (ref 4.14–5.80)
RDW: 12.8 % (ref 11.6–15.4)
WBC: 7.7 10*3/uL (ref 3.4–10.8)

## 2023-04-21 LAB — IRON,TIBC AND FERRITIN PANEL
Ferritin: 91 ng/mL (ref 30–400)
Iron Saturation: 28 % (ref 15–55)
Iron: 116 ug/dL (ref 38–169)
Total Iron Binding Capacity: 408 ug/dL (ref 250–450)
UIBC: 292 ug/dL (ref 111–343)

## 2023-04-21 LAB — COMPREHENSIVE METABOLIC PANEL
ALT: 31 IU/L (ref 0–44)
AST: 24 IU/L (ref 0–40)
Albumin: 4.5 g/dL (ref 4.1–5.1)
Alkaline Phosphatase: 60 IU/L (ref 44–121)
BUN/Creatinine Ratio: 9 (ref 9–20)
BUN: 9 mg/dL (ref 6–24)
Bilirubin Total: 0.4 mg/dL (ref 0.0–1.2)
CO2: 22 mmol/L (ref 20–29)
Calcium: 9.4 mg/dL (ref 8.7–10.2)
Chloride: 102 mmol/L (ref 96–106)
Creatinine, Ser: 1.05 mg/dL (ref 0.76–1.27)
Globulin, Total: 2.9 g/dL (ref 1.5–4.5)
Glucose: 84 mg/dL (ref 70–99)
Potassium: 4.7 mmol/L (ref 3.5–5.2)
Sodium: 139 mmol/L (ref 134–144)
Total Protein: 7.4 g/dL (ref 6.0–8.5)
eGFR: 88 mL/min/{1.73_m2} (ref >59)

## 2023-04-21 LAB — PSA, TOTAL AND FREE
PSA, Free Pct: 52.9 %
PSA, Free: 0.37 ng/mL
Prostate Specific Ag, Serum: 0.7 ng/mL (ref 0.0–4.0)

## 2023-04-21 LAB — LIPID PANEL
Chol/HDL Ratio: 5 ratio (ref 0.0–5.0)
Cholesterol, Total: 216 mg/dL — ABNORMAL HIGH (ref 100–199)
HDL: 43 mg/dL (ref >39)
LDL Chol Calc (NIH): 151 mg/dL — ABNORMAL HIGH (ref 0–99)
Triglycerides: 122 mg/dL (ref 0–149)
VLDL Cholesterol Cal: 22 mg/dL (ref 5–40)

## 2023-04-21 LAB — B12 AND FOLATE PANEL
Folate: >20 ng/mL (ref >3.0)
Vitamin B-12: 876 pg/mL (ref 232–1245)

## 2023-04-21 LAB — ERYTHROPOIETIN: Erythropoietin: 13.5 m[IU]/mL (ref 2.6–18.5)

## 2023-04-21 LAB — TSH: TSH: 2.38 u[IU]/mL (ref 0.450–4.500)

## 2023-04-21 LAB — HEMOGLOBIN A1C
Est. average glucose Bld gHb Est-mCnc: 114 mg/dL
Hgb A1c MFr Bld: 5.6 % (ref 4.8–5.6)

## 2023-04-23 ENCOUNTER — Ambulatory Visit (AMBULATORY_SURGERY_CENTER): Payer: Medicaid Other

## 2023-04-23 VITALS — Ht 68.0 in | Wt 200.0 lb

## 2023-04-23 DIAGNOSIS — Z86010 Personal history of colonic polyps: Secondary | ICD-10-CM

## 2023-04-23 MED ORDER — NA SULFATE-K SULFATE-MG SULF 17.5-3.13-1.6 GM/177ML PO SOLN
1.0000 | Freq: Once | ORAL | 0 refills | Status: AC
Start: 2023-04-23 — End: 2023-04-23

## 2023-04-23 MED ORDER — NA SULFATE-K SULFATE-MG SULF 17.5-3.13-1.6 GM/177ML PO SOLN
1.0000 | Freq: Once | ORAL | 0 refills | Status: DC
Start: 2023-04-23 — End: 2023-04-23

## 2023-04-23 NOTE — Progress Notes (Signed)
Allergy to egg  No soy allergy known to patient  No issues known to pt with past sedation with any surgeries or procedures Patient denies ever being told they had issues or difficulty with intubation  No FH of Malignant Hyperthermia Pt is not on diet pills Pt is not on  home 02  Pt is not on blood thinners  Pt denies issues with constipation  No A fib or A flutter Have any cardiac testing pending--no Pt can ambulate independently Pt denies use of chewing tobacco Discussed diabetic I weight loss medication holds Discussed NSAID holds Checked BMI Pt instructed to use Singlecare.com or GoodRx for a price reduction on prep  Patient's chart reviewed by Cathlyn Parsons CNRA prior to previsit and patient appropriate for the [facility].  Pre visit completed and red dot placed by patient's name on their procedure day (on provider's schedule).

## 2023-04-23 NOTE — Addendum Note (Signed)
Addended by: Jaquelyn Bitter on: 04/23/2023 05:03 PM   Modules accepted: Orders

## 2023-05-08 ENCOUNTER — Ambulatory Visit (AMBULATORY_SURGERY_CENTER): Payer: Medicaid Other | Admitting: Gastroenterology

## 2023-05-08 ENCOUNTER — Encounter: Payer: Self-pay | Admitting: Gastroenterology

## 2023-05-08 VITALS — BP 108/75 | HR 75 | Temp 99.8°F | Resp 11 | Ht 68.0 in | Wt 200.0 lb

## 2023-05-08 DIAGNOSIS — D120 Benign neoplasm of cecum: Secondary | ICD-10-CM | POA: Diagnosis not present

## 2023-05-08 DIAGNOSIS — K5730 Diverticulosis of large intestine without perforation or abscess without bleeding: Secondary | ICD-10-CM

## 2023-05-08 DIAGNOSIS — K635 Polyp of colon: Secondary | ICD-10-CM

## 2023-05-08 DIAGNOSIS — K641 Second degree hemorrhoids: Secondary | ICD-10-CM

## 2023-05-08 DIAGNOSIS — Z86010 Personal history of colonic polyps: Secondary | ICD-10-CM

## 2023-05-08 DIAGNOSIS — Z09 Encounter for follow-up examination after completed treatment for conditions other than malignant neoplasm: Secondary | ICD-10-CM | POA: Diagnosis not present

## 2023-05-08 DIAGNOSIS — K621 Rectal polyp: Secondary | ICD-10-CM | POA: Diagnosis not present

## 2023-05-08 DIAGNOSIS — D128 Benign neoplasm of rectum: Secondary | ICD-10-CM

## 2023-05-08 HISTORY — PX: COLONOSCOPY: SHX174

## 2023-05-08 MED ORDER — SODIUM CHLORIDE 0.9 % IV SOLN: 500.0000 mL | Freq: Once | INTRAVENOUS | Status: DC

## 2023-05-08 NOTE — Op Note (Signed)
[facility] Patient Name: Brian Spears Procedure Date: 05/08/2023 3:36 PM MRN: [id number] Endoscopist: Doristine Locks , MD, [id number] Age: 47 Referring MD:  Date of Birth: 11-17-1975 Gender: Male Account #: [id number] Procedure:                Colonoscopy Indications:              High risk colon cancer surveillance: Personal                            history of adenoma less than 10 mm in size, High                            risk colon cancer surveillance: Personal history of                            sessile serrated colon polyp (less than 10 mm in                            size) with no dysplasia                           Last colonoscopy was 03/23/2020 and notable for 2 mm                            cecal SSP, 2 subcentimeter adenomas in the                            descending/ascending colon, and an 8 mm flat SSP in                            the transverse colon along with sigmoid                            diverticulosis and internal hemorrhoids.                            Recommended repeat in 3 years. Medicines:                Monitored Anesthesia Care Procedure:                Pre-Anesthesia Assessment:                           - Prior to the procedure, a History and Physical                            was performed, and patient medications and                            allergies were reviewed. The patient's tolerance of                            previous anesthesia was also reviewed. The risks  and benefits of the procedure and the sedation                            options and risks were discussed with the patient.                            All questions were answered, and informed consent                            was obtained. Prior Anticoagulants: The patient has                            taken no anticoagulant or antiplatelet agents. ASA                            Grade Assessment: II - A patient with mild systemic                             disease. After reviewing the risks and benefits,                            the patient was deemed in satisfactory condition to                            undergo the procedure.                           After obtaining informed consent, the colonoscope                            was passed under direct vision. Throughout the                            procedure, the patient's blood pressure, pulse, and                            oxygen saturations were monitored continuously. The                            Olympus Scope [id number] was introduced through the                            anus and advanced to the the terminal ileum. The                            colonoscopy was performed without difficulty. The                            patient tolerated the procedure well. The quality                            of the bowel preparation was good. The terminal  ileum, ileocecal valve, appendiceal orifice, and                            rectum were photographed. Scope In: 3:45:03 PM Scope Out: 3:59:49 PM Scope Withdrawal Time: 0 hours 12 minutes 45 seconds  Total Procedure Duration: 0 hours 14 minutes 46 seconds  Findings:                 The perianal and digital rectal examinations were                            normal.                           Two sessile polyps were found in the cecum. The                            polyps were 2 to 4 mm in size. These polyps were                            removed with a cold snare. Resection and retrieval                            were complete. Estimated blood loss was minimal.                           A 3 mm polyp was found in the rectum. The polyp was                            sessile. The polyp was removed with a cold snare.                            Resection and retrieval were complete. Estimated                            blood loss was minimal.                           A few small-mouthed  diverticula were found in the                            sigmoid colon.                           Non-bleeding internal hemorrhoids were found during                            retroflexion. The hemorrhoids were small.                           The terminal ileum appeared normal. Complications:            No immediate complications. Estimated Blood Loss:     Estimated blood loss was minimal. Impression:               - Two 2  to 4 mm polyps in the cecum, removed with a                            cold snare. Resected and retrieved.                           - One 3 mm polyp in the rectum, removed with a cold                            snare. Resected and retrieved.                           - Diverticulosis in the sigmoid colon.                           - Non-bleeding internal hemorrhoids.                           - The examined portion of the ileum was normal. Recommendation:           - Patient has a contact number available for                            emergencies. The signs and symptoms of potential                            delayed complications were discussed with the                            patient. Return to normal activities tomorrow.                            Written discharge instructions were provided to the                            patient.                           - Resume previous diet.                           - Continue present medications.                           - Await pathology results.                           - Repeat colonoscopy in 3 - 5 years for                            surveillance based on pathology results.                           - Return to GI office PRN. Doristine Locks, MD 05/08/2023 4:08:40 PM

## 2023-05-08 NOTE — Patient Instructions (Addendum)
Recommendation:           - Patient has a contact number available for                            emergencies. The signs and symptoms of potential                            delayed complications were discussed with the                            patient. Return to normal activities tomorrow.                            Written discharge instructions were provided to the                            patient.                           - Resume previous diet.                           - Continue present medications.                           - Await pathology results.                           - Repeat colonoscopy in 3 - 5 years for                            surveillance based on pathology results.                           - Return to GI office PRN.  YOU HAD AN ENDOSCOPIC PROCEDURE TODAY AT THE [facility]:   Refer to the procedure report that was given to you for any specific questions about what was found during the examination.  If the procedure report does not answer your questions, please call your gastroenterologist to clarify.  If you requested that your care partner not be given the details of your procedure findings, then the procedure report has been included in a sealed envelope for you to review at your convenience later.  YOU SHOULD EXPECT: Some feelings of bloating in the abdomen. Passage of more gas than usual.  Walking can help get rid of the air that was put into your GI tract during the procedure and reduce the bloating. If you had a lower endoscopy (such as a colonoscopy or flexible sigmoidoscopy) you may notice spotting of blood in your stool or on the toilet paper. If you underwent a bowel prep for your procedure, you may not have a normal bowel movement for a few days.  Please Note:  You might notice some irritation and congestion in your nose or some drainage.  This is from the oxygen used during your procedure.  There is no need for concern and it should clear up in  a day or so.  SYMPTOMS TO REPORT IMMEDIATELY:  Following lower endoscopy (colonoscopy  or flexible sigmoidoscopy):  Excessive amounts of blood in the stool  Significant tenderness or worsening of abdominal pains  Swelling of the abdomen that is new, acute  Fever of 100F or higher  For urgent or emergent issues, a gastroenterologist can be reached at any hour by calling [phone]. Do not use MyChart messaging for urgent concerns.    DIET:  We do recommend a small meal at first, but then you may proceed to your regular diet.  Drink plenty of fluids but you should avoid alcoholic beverages for 24 hours.  ACTIVITY:  You should plan to take it easy for the rest of today and you should NOT DRIVE or use heavy machinery until tomorrow (because of the sedation medicines used during the test).    FOLLOW UP: Our staff will call the number listed on your records the next business day following your procedure.  We will call around 7:15- 8:00 am to check on you and address any questions or concerns that you may have regarding the information given to you following your procedure. If we do not reach you, we will leave a message.     If any biopsies were taken you will be contacted by phone or by letter within the next 1-3 weeks.  Please call us at [phone] if you have not heard about the biopsies in 3 weeks.    SIGNATURES/CONFIDENTIALITY: You and/or your care partner have signed paperwork which will be entered into your electronic medical record.  These signatures attest to the fact that that the information above on your After Visit Summary has been reviewed and is understood.  Full responsibility of the confidentiality of this discharge information lies with you and/or your care-partner.

## 2023-05-08 NOTE — Progress Notes (Signed)
GASTROENTEROLOGY PROCEDURE H&P NOTE   Primary Care Physician: Monica Becton, MD    Reason for Procedure:  Colon polyp surveillance  Plan:    Colonoscopy  Patient is appropriate for endoscopic procedure(s) in the ambulatory ([facility]) setting.  The nature of the procedure, as well as the risks, benefits, and alternatives were carefully and thoroughly reviewed with the patient. Ample time for discussion and questions allowed. The patient understood, was satisfied, and agreed to proceed.     HPI: Brian Spears is a 47 y.o. male who presents for colonoscopy for ongoing polyp surveillance.  No active GI symptoms.    Last colonoscopy was 03/23/2020 and notable for 2 mm cecal SSP, 2 subcentimeter adenomas in the descending/ascending colon, and an 8 mm flat SSP in the transverse colon along with sigmoid diverticulosis and internal hemorrhoids.  Recommended repeat in 3 years.  Family history notable for mother with colon cancer.    Past Medical History:  Diagnosis Date   Anxiety    Arthritis    Celiac disease    Hand fracture    Headache    Hyperlipidemia    Lyme disease    Migraines    Normal cardiac stress test 11/2008   at [facility] - negative.  terminated for fatigue   Thyroid disease     Past Surgical History:  Procedure Laterality Date   surgery on right hand Bilateral     Prior to Admission medications   Medication Sig Start Date End Date Taking? Authorizing Provider  Ascorbic Acid (VITAMIN C WITH ROSE HIPS) 500 MG tablet Take 500 mg by mouth in the morning and at bedtime.    [provider]  Ascorbic Acid (VITAMIN C) 100 MG tablet Take 200 mg by mouth daily.    [provider]  COPPER PO Take 3 mg by mouth.    [provider]  DHEA 25 MG CAPS Take by mouth.    [provider]  Digestive Enzymes (SUPER ENZYMES PO) Take by mouth.    [provider]  folic acid (FOLVITE) 1 MG tablet Take 1 mg by mouth daily.    [provider]  hydrOXYzine (VISTARIL) 25 MG capsule Take 1 capsule (25 mg total) by mouth every 8 (eight) hours as needed. 04/20/23   Monica Becton, MD  Iron Combinations (CHROMAGEN) capsule Take 1 capsule by mouth daily.    [provider]  Iron Combinations (IRON COMPLEX PO) Take 10 mg by mouth.    [provider]  LORazepam (ATIVAN) 1 MG tablet Take 1 tablet (1 mg total) by mouth 2 (two) times daily as needed for anxiety. 04/20/23   Monica Becton, MD  MAGNESIUM MALATE PO Take by mouth.    [provider]  Misc Natural Products (BEET ROOT PO) Take 200 mg by mouth.    [provider]  Omega-3 Fatty Acids (EPA PO) Take by mouth.    [provider]  ondansetron (ZOFRAN-ODT) 8 MG disintegrating tablet Take 1 tablet (8 mg total) by mouth 3 (three) times daily. 04/20/23   Monica Becton, MD  tadalafil (CIALIS) 5 MG tablet Take 5 mg by mouth daily as needed for erectile dysfunction.    [provider]  testosterone cypionate (DEPOTESTOSTERONE CYPIONATE) 200 MG/ML injection Inject 0.25 mLs (50 mg total) into the muscle every 3 (three) days. 04/20/23   Monica Becton, MD  VITAMIN D, CHOLECALCIFEROL, PO Take by mouth.    [provider]  Current Outpatient Medications  Medication Sig Dispense Refill   Ascorbic Acid (VITAMIN C WITH ROSE HIPS) 500 MG tablet Take 500 mg by mouth in the morning and at bedtime.     Ascorbic Acid (VITAMIN C) 100 MG tablet Take 200 mg by mouth daily.     COPPER PO Take 3 mg by mouth.     DHEA 25 MG CAPS Take by mouth.     Digestive Enzymes (SUPER ENZYMES PO) Take by mouth.     folic acid (FOLVITE) 1 MG tablet Take 1 mg by mouth daily.     hydrOXYzine (VISTARIL) 25 MG capsule Take 1 capsule (25 mg total) by mouth every 8 (eight) hours as needed. 30 capsule 0   Iron Combinations (CHROMAGEN) capsule Take 1 capsule by mouth daily.     Iron Combinations (IRON COMPLEX PO) Take 10 mg by  mouth.     LORazepam (ATIVAN) 1 MG tablet Take 1 tablet (1 mg total) by mouth 2 (two) times daily as needed for anxiety. 60 tablet 3   MAGNESIUM MALATE PO Take by mouth.     Misc Natural Products (BEET ROOT PO) Take 200 mg by mouth.     Omega-3 Fatty Acids (EPA PO) Take by mouth.     ondansetron (ZOFRAN-ODT) 8 MG disintegrating tablet Take 1 tablet (8 mg total) by mouth 3 (three) times daily. 20 tablet 11   tadalafil (CIALIS) 5 MG tablet Take 5 mg by mouth daily as needed for erectile dysfunction.     testosterone cypionate (DEPOTESTOSTERONE CYPIONATE) 200 MG/ML injection Inject 0.25 mLs (50 mg total) into the muscle every 3 (three) days. 10 mL 3   VITAMIN D, CHOLECALCIFEROL, PO Take by mouth.     No current facility-administered medications for this visit.    Allergies as of 05/08/2023 - Review Complete 05/08/2023  Allergen Reaction Noted   Sulfa antibiotics Other (See Comments) 12/11/2009   Egg white (diagnostic)  12/28/2020   Gluten meal  03/23/2020   Naproxen sodium Swelling 07/15/2010   Peanut-containing drug products  12/28/2020   Salmon [fish oil]  12/28/2020   Barley grass Dermatitis, Diarrhea, Nausea And Vomiting, and Rash 12/28/2020   Corn-containing products Dermatitis, Diarrhea, Nausea And Vomiting, and Rash 12/28/2020   Crab (diagnostic) Dermatitis, Diarrhea, Nausea And Vomiting, and Rash 12/28/2020   Oat grain (diagnostic) Dermatitis, Diarrhea, Nausea And Vomiting, and Rash 12/28/2020   Other Dermatitis, Nausea And Vomiting, Rash, and Diarrhea 12/28/2020   Shellfish allergy Dermatitis, Diarrhea, Nausea And Vomiting, and Rash 12/28/2020   Shrimp (diagnostic) Dermatitis, Diarrhea, Itching, and Rash 12/28/2020    Family History  Problem Relation Age of Onset   Colon cancer Mother    Colon polyps Mother    Breast cancer Mother    Hyperlipidemia Mother    Hypertension Father    Hyperlipidemia Father    Other Other        grandfather with alcoholism   Heart attack  Other        grandmother   Diabetes Other        grandmother   Colon cancer Other        great uncles on Dads side   Breast cancer Other        aunt   Lung cancer Other        grandmother   Prostate cancer Other        grandfather   Melanoma Other        uncle   Esophageal cancer Neg Hx  Rectal cancer Neg Hx    Stomach cancer Neg Hx     Social History   Socioeconomic History   Marital status: Divorced    Spouse name: Not on file   Number of children: 1   Years of education: college   Highest education level: Associate degree: occupational, Scientist, product/process development, or vocational program  Occupational History   Occupation: Designer, industrial/product for [employer]  Tobacco Use   Smoking status: Former    Current packs/day: 0.20    Types: Cigarettes   Smokeless tobacco: Former    Quit date: 06/02/2011   Tobacco comments:    started smoking at age 25, started smokeless tobacco at age 42  Vaping Use   Vaping status: Some Days  Substance and Sexual Activity   Alcohol use: No   Drug use: No   Sexual activity: Not on file  Other Topics Concern   Not on file  Social History Narrative   17 years of education   Married to [place] with 1 son   Mother in law lives in the home   Right-handed.   No daily caffeine use.    Social Determinants of Health   Financial Resource Strain: Medium Risk (04/18/2023)   Overall Financial Resource Strain (CARDIA)    Difficulty of Paying Living Expenses: Somewhat hard  Food Insecurity: Food Insecurity Present (04/18/2023)   Hunger Vital Sign    Worried About Running Out of Food in the Last Year: Sometimes true    Ran Out of Food in the Last Year: Sometimes true  Transportation Needs: No Transportation Needs (04/18/2023)   PRAPARE - Administrator, Civil Service (Medical): No    Lack of Transportation (Non-Medical): No  Physical Activity: Sufficiently Active (04/18/2023)   Exercise Vital Sign    Days of Exercise per Week: 3 days    Minutes of Exercise per  Session: 60 min  Stress: Stress Concern Present (04/18/2023)   [employer] of Occupational Health - Occupational Stress Questionnaire    Feeling of Stress : To some extent  Social Connections: Unknown (04/18/2023)   Social Connection and Isolation Panel [NHANES]    Frequency of Communication with Friends and Family: More than three times a week    Frequency of Social Gatherings with Friends and Family: Once a week    Attends Religious Services: Patient declined    Database administrator or Organizations: No    Attends Engineer, structural: Not on file    Marital Status: Divorced  Intimate Partner Violence: Unknown (12/02/2021)   Received from [employer], [facility]   HITS    Physically Hurt: Not on file    Insult or Talk Down To: Not on file    Threaten Physical Harm: Not on file    Scream or Curse: Not on file    Physical Exam: Vital signs in last 24 hours: @There  were no vitals taken for this visit. GEN: NAD EYE: Sclerae anicteric ENT: MMM CV: Non-tachycardic Pulm: CTA b/l GI: Soft, NT/ND NEURO:  Alert & Oriented x 3   Doristine Locks, DO [facility]   05/08/2023 2:54 PM

## 2023-05-08 NOTE — Progress Notes (Signed)
Vss nad trans to pacu 

## 2023-05-08 NOTE — Progress Notes (Signed)
Called to room to assist during endoscopic procedure.  Patient ID and intended procedure confirmed with present staff. Received instructions for my participation in the procedure from the performing physician.  

## 2023-05-11 ENCOUNTER — Other Ambulatory Visit: Payer: Self-pay | Admitting: Medical Genetics

## 2023-05-11 ENCOUNTER — Telehealth: Payer: Self-pay

## 2023-05-11 DIAGNOSIS — Z006 Encounter for examination for normal comparison and control in clinical research program: Secondary | ICD-10-CM

## 2023-05-11 NOTE — Telephone Encounter (Signed)
  Follow up Call-     05/08/2023    3:20 PM  Call back number  Post procedure Call Back phone  # [phone]  Permission to leave phone message Yes     Patient questions:  Do you have a fever, pain , or abdominal swelling? No. Pain Score  0 *  Have you tolerated food without any problems? Yes.    Have you been able to return to your normal activities? Yes.    Do you have any questions about your discharge instructions: Diet   No. Medications  No. Follow up visit  No.  Do you have questions or concerns about your Care? No.  Actions: * If pain score is 4 or above: No action needed, pain <4.

## 2023-05-13 LAB — SURGICAL PATHOLOGY

## 2023-05-28 ENCOUNTER — Encounter: Payer: Self-pay | Admitting: Sports Medicine

## 2023-06-03 ENCOUNTER — Ambulatory Visit
Admission: EM | Admit: 2023-06-03 | Discharge: 2023-06-03 | Disposition: A | Discharge status: Home / Self Care | Payer: Medicaid Other | Attending: Family Medicine | Admitting: Family Medicine

## 2023-06-03 ENCOUNTER — Other Ambulatory Visit: Payer: Self-pay

## 2023-06-03 DIAGNOSIS — H15101 Unspecified episcleritis, right eye: Secondary | ICD-10-CM

## 2023-06-03 MED ORDER — TOBRAMYCIN-DEXAMETHASONE 0.3-0.1 % OP SUSP: 1.0000 [drp] | OPHTHALMIC | 0 refills | Status: DC

## 2023-06-03 NOTE — ED Triage Notes (Signed)
C/o right eye redness and pressure since Tuesday. No visual changes.

## 2023-06-03 NOTE — ED Provider Notes (Signed)
[facility]    CSN: [id number] Arrival date & time: 06/03/23  1626      History   Chief Complaint Chief Complaint  Patient presents with   Eye Problem    HPI Brian Spears is a 47 y.o. male.   Patient has a localized eye redness on the outside of his right eye.  He wears contact lenses.  It is mildly irritated.  He does not have any photophobia.  No tearing or discharge.  No other signs of infection or runny nose.  He continues to wear the contact lens.  He is advised to take this out at any time he has eye symptoms or redness.  Patient also mentions that he was just diagnosed last week with a melanoma.  A biopsy was done of the lesion on his scalp.  He is going to have additional surgery to remove surrounding tissues.  He is interested in additional workup, I will notify his PCP  Past Medical History:  Diagnosis Date   Anxiety    Arthritis    Celiac disease    Hand fracture    Headache    Hyperlipidemia    Lyme disease    Meniere disease    Migraines    Normal cardiac stress test 11/2008   at [facility] - negative.  terminated for fatigue   Thyroid disease     Patient Active Problem List   Diagnosis Date Noted   History of Lyme disease 03/25/2019   Chronic migraine 03/10/2019   Generalized anxiety manifesting as headache 01/18/2019   Lateral epicondylitis, right elbow 12/15/2018   Plantar fasciitis, left 03/05/2018   Right shoulder pain 07/02/2017   Tinnitus, right 06/09/2016   Medial epicondylitis of right elbow 02/18/2016   Onychodystrophy 12/05/2014   Primary osteoarthritis of both elbows 12/05/2014   Elevated hemoglobin (HCC) 08/01/2013   Male hypogonadism 06/01/2013   GERD (gastroesophageal reflux disease) 03/01/2013   History of smoking 06/07/2012   Annual physical exam 06/07/2012   Celiac disease 06/30/2011   Hyperlipidemia 09/27/2010   Bilateral carpal tunnel syndrome 09/27/2010    Past Surgical History:  Procedure Laterality Date    COLONOSCOPY  05/08/2023   Vito Cirigliano   COLONOSCOPY W/ POLYPECTOMY  2021   [facility]   surgery on right hand Bilateral        Home Medications    Prior to Admission medications   Medication Sig Start Date End Date Taking? Authorizing Provider  PREGNENOLONE MICRONIZED PO Take by mouth.   Yes [provider]  tobramycin-dexamethasone [facility] D[facility]) ophthalmic solution Place 1 drop into the right eye every 4 (four) hours while awake. 06/03/23  Yes Eustace Moore, MD  Ascorbic Acid (VITAMIN C WITH ROSE HIPS) 500 MG tablet Take 500 mg by mouth in the morning and at bedtime.    [provider]  COPPER PO Take 3 mg by mouth.    [provider]  DHEA 25 MG CAPS Take by mouth.    [provider]  Digestive Enzymes (SUPER ENZYMES PO) Take by mouth.    [provider]  folic acid (FOLVITE) 1 MG tablet Take 1 mg by mouth daily.    [provider]  hydrOXYzine (VISTARIL) 25 MG capsule Take 1 capsule (25 mg total) by mouth every 8 (eight) hours as needed. 04/20/23   Monica Becton, MD  Iron Combinations (CHROMAGEN) capsule Take 1 capsule by mouth daily.    [provider]  Iron Combinations (IRON COMPLEX  PO) Take 10 mg by mouth.    [provider]  LORazepam (ATIVAN) 1 MG tablet Take 1 tablet (1 mg total) by mouth 2 (two) times daily as needed for anxiety. 04/20/23   Monica Becton, MD  MAGNESIUM MALATE PO Take by mouth.    [provider]  Misc Natural Products (BEET ROOT PO) Take 200 mg by mouth.    [provider]  Omega-3 Fatty Acids (EPA PO) Take by mouth.    [provider]  ondansetron (ZOFRAN-ODT) 8 MG disintegrating tablet Take 1 tablet (8 mg total) by mouth 3 (three) times daily. 04/20/23   Monica Becton, MD  tadalafil (CIALIS) 5 MG tablet Take 5 mg by mouth daily as needed for erectile dysfunction.    [provider]  testosterone cypionate (DEPOTESTOSTERONE  CYPIONATE) 200 MG/ML injection Inject 0.25 mLs (50 mg total) into the muscle every 3 (three) days. 04/20/23   Monica Becton, MD  VITAMIN D, CHOLECALCIFEROL, PO Take by mouth.    [provider]    Family History Family History  Problem Relation Age of Onset   Colon cancer Mother    Colon polyps Mother    Breast cancer Mother    Hyperlipidemia Mother    Hypertension Father    Hyperlipidemia Father    Other Other        grandfather with alcoholism   Heart attack Other        grandmother   Diabetes Other        grandmother   Colon cancer Other        great uncles on Dads side   Breast cancer Other        aunt   Lung cancer Other        grandmother   Prostate cancer Other        grandfather   Melanoma Other        uncle   Esophageal cancer Neg Hx    Rectal cancer Neg Hx    Stomach cancer Neg Hx     Social History Social History   Tobacco Use   Smoking status: Former    Current packs/day: 0.20    Types: Cigarettes   Smokeless tobacco: Former    Quit date: 06/02/2011   Tobacco comments:    started smoking at age 65, started smokeless tobacco at age 45  Vaping Use   Vaping status: Some Days  Substance Use Topics   Alcohol use: No   Drug use: No     Allergies   Sulfa antibiotics, Egg white (diagnostic), Gluten meal, Naproxen sodium, Peanut-containing drug products, Salmon [fish oil], Barley grass, Corn-containing products, Crab (diagnostic), Oat grain (diagnostic), Shellfish allergy, and Shrimp (diagnostic)   Review of Systems Review of Systems See HPI  Physical Exam Triage Vital Signs ED Triage Vitals  Encounter Vitals Group     BP 06/03/23 1639 125/80     Systolic BP Percentile --      Diastolic BP Percentile --      Pulse Rate 06/03/23 1639 65     Resp 06/03/23 1639 16     Temp 06/03/23 1639 98.6 F (37 C)     Temp Source 06/03/23 1639 Oral     SpO2 06/03/23 1639 99 %     Weight --      Height --      Head Circumference --       Peak Flow --      Pain Score  06/03/23 1641 1     Pain Loc --      Pain Education --      Exclude from Growth Chart --    No data found.  Updated Vital Signs BP 125/80 (BP Location: Left Arm)   Pulse 65   Temp 98.6 F (37 C) (Oral)   Resp 16   SpO2 99%   Visual Acuity Right Eye Distance: 20/40 Left Eye Distance: 20/25 Bilateral Distance: 20/25     Physical Exam Constitutional:      General: He is not in acute distress.    Appearance: He is well-developed.  HENT:     Head: Normocephalic and atraumatic.  Eyes:     Conjunctiva/sclera: Conjunctivae normal.     Pupils: Pupils are equal, round, and reactive to light.     Comments: Patient has an area of increased vascularity lateral to the cornea.  Lids everted.  No foreign body.  Tetracaine drops were placed.  Fluorescein exam is negative.  Faint uptake in area of increased redness.  No abrasion  Cardiovascular:     Rate and Rhythm: Normal rate.  Pulmonary:     Effort: Pulmonary effort is normal. No respiratory distress.  Abdominal:     General: There is no distension.     Palpations: Abdomen is soft.  Musculoskeletal:        General: Normal range of motion.     Cervical back: Normal range of motion.  Skin:    General: Skin is warm and dry.  Neurological:     Mental Status: He is alert.      UC Treatments / Results  Labs (all labs ordered are listed, but only abnormal results are displayed) Labs Reviewed - No data to display  EKG   Radiology No results found.  Procedures Procedures (including critical care time)  Medications Ordered in UC Medications - No data to display  Initial Impression / Assessment and Plan / UC Course  I have reviewed the triage vital signs and the nursing notes.  Pertinent labs & imaging results that were available during my care of the patient were reviewed by me and considered in my medical decision making (see chart for details).    Final Clinical Impressions(s) / UC  Diagnoses   Final diagnoses:  Episcleritis of right eye     Discharge Instructions      Must discontinue contact lens until treated Use eyedrops for 5 to 7 days then discontinue If eye irritation persist you must see your eye doctor   ED Prescriptions     Medication Sig Dispense Auth. Provider   tobramycin-dexamethasone [facility]) ophthalmic solution Place 1 drop into the right eye every 4 (four) hours while awake. 2.5 mL Eustace Moore, MD      PDMP not reviewed this encounter.   Eustace Moore, MD 06/03/23 [phone]

## 2023-06-03 NOTE — Discharge Instructions (Signed)
Must discontinue contact lens until treated Use eyedrops for 5 to 7 days then discontinue If eye irritation persist you must see your eye doctor

## 2023-07-06 ENCOUNTER — Other Ambulatory Visit (HOSPITAL_COMMUNITY): Payer: Medicaid Other

## 2023-08-07 ENCOUNTER — Encounter: Payer: BC Managed Care – PPO | Admitting: Sports Medicine

## 2023-10-21 ENCOUNTER — Ambulatory Visit: Payer: Medicare Other | Admitting: Sports Medicine

## 2023-11-15 ENCOUNTER — Telehealth: Admitting: Physician Assistant

## 2023-11-15 ENCOUNTER — Encounter: Payer: Self-pay | Admitting: Physician Assistant

## 2023-11-15 DIAGNOSIS — R5383 Other fatigue: Secondary | ICD-10-CM

## 2023-11-15 NOTE — Patient Instructions (Signed)
 Brian Spears, thank you for joining Laure Kidney, PA-C for today's virtual visit.  While this provider is not your primary care provider (PCP), if your PCP is located in our provider database this encounter information will be shared with them immediately following your visit.   A [facility] MyChart account gives you access to today's visit and all your visits, tests, and labs performed at [facility] " click here if you don't have a [facility] MyChart account or go to [email or web address]  Consent: (Patient) Brian Spears provided verbal consent for this virtual visit at the beginning of the encounter.  Current Medications:  Current Outpatient Medications:    Ascorbic Acid (VITAMIN C WITH ROSE HIPS) 500 MG tablet, Take 500 mg by mouth in the morning and at bedtime., Disp: , Rfl:    COPPER PO, Take 3 mg by mouth., Disp: , Rfl:    DHEA 25 MG CAPS, Take by mouth., Disp: , Rfl:    Digestive Enzymes (SUPER ENZYMES PO), Take by mouth., Disp: , Rfl:    folic acid (FOLVITE) 1 MG tablet, Take 1 mg by mouth daily., Disp: , Rfl:    hydrOXYzine (VISTARIL) 25 MG capsule, Take 1 capsule (25 mg total) by mouth every 8 (eight) hours as needed., Disp: 30 capsule, Rfl: 0   Iron Combinations (CHROMAGEN) capsule, Take 1 capsule by mouth daily., Disp: , Rfl:    Iron Combinations (IRON COMPLEX PO), Take 10 mg by mouth., Disp: , Rfl:    LORazepam (ATIVAN) 1 MG tablet, Take 1 tablet (1 mg total) by mouth 2 (two) times daily as needed for anxiety., Disp: 60 tablet, Rfl: 3   MAGNESIUM MALATE PO, Take by mouth., Disp: , Rfl:    Misc Natural Products (BEET ROOT PO), Take 200 mg by mouth., Disp: , Rfl:    Omega-3 Fatty Acids (EPA PO), Take by mouth., Disp: , Rfl:    ondansetron (ZOFRAN-ODT) 8 MG disintegrating tablet, Take 1 tablet (8 mg total) by mouth 3 (three) times daily., Disp: 20 tablet, Rfl: 11   PREGNENOLONE MICRONIZED PO, Take by mouth., Disp: , Rfl:    tadalafil (CIALIS) 5 MG tablet,  Take 5 mg by mouth daily as needed for erectile dysfunction., Disp: , Rfl:    testosterone cypionate (DEPOTESTOSTERONE CYPIONATE) 200 MG/ML injection, Inject 0.25 mLs (50 mg total) into the muscle every 3 (three) days., Disp: 10 mL, Rfl: 3   tobramycin-dexamethasone (TOBRADEX) ophthalmic solution, Place 1 drop into the right eye every 4 (four) hours while awake., Disp: 2.5 mL, Rfl: 0   VITAMIN D, CHOLECALCIFEROL, PO, Take by mouth., Disp: , Rfl:    Medications ordered in this encounter:  No orders of the defined types were placed in this encounter.    *If you need refills on other medications prior to your next appointment, please contact your pharmacy*  Follow-Up: Call back or seek an in-person evaluation if the symptoms worsen or if the condition fails to improve as anticipated.  [facility] [phone]  Other Instructions Report to ER for evaluation.    If you have been instructed to have an in-person evaluation today at a local Urgent Care facility, please use the link below. It will take you to a list of all of our available [facility], including address, phone number and hours of operation. Please do not delay care.  [facility]  If you or a family member do not have a primary care provider, use  the link below to schedule a visit and establish care. When you choose a [facility] primary care physician or advanced practice provider, you gain a long-term partner in health. Find a Primary Care Provider  Learn more about [facility]'s in-office and virtual care options: [facility] - Get Care Now

## 2023-11-15 NOTE — Progress Notes (Signed)
 Virtual Visit Consent   Brian Spears, you are scheduled for a virtual visit with a [facility] provider today. Just as with appointments in the office, your consent must be obtained to participate. Your consent will be active for this visit and any virtual visit you may have with one of our providers in the next 365 days. If you have a MyChart account, a copy of this consent can be sent to you electronically.  As this is a virtual visit, video technology does not allow for your provider to perform a traditional examination. This may limit your provider's ability to fully assess your condition. If your provider identifies any concerns that need to be evaluated in person or the need to arrange testing (such as labs, EKG, etc.), we will make arrangements to do so. Although advances in technology are sophisticated, we cannot ensure that it will always work on either your end or our end. If the connection with a video visit is poor, the visit may have to be switched to a telephone visit. With either a video or telephone visit, we are not always able to ensure that we have a secure connection.  By engaging in this virtual visit, you consent to the provision of healthcare and authorize for your insurance to be billed (if applicable) for the services provided during this visit. Depending on your insurance coverage, you may receive a charge related to this service.  I need to obtain your verbal consent now. Are you willing to proceed with your visit today? Brian Spears has provided verbal consent on 11/15/2023 for a virtual visit (video or telephone). Laure Kidney, [place]  Date: 11/15/2023 4:06 PM   Virtual Visit via Video Note   I, Laure Kidney, connected with  Brian Spears  ([id number], 1976/01/06) on 11/15/23 at  4:00 PM EDT by a video-enabled telemedicine application and verified that I am speaking with the correct person using two identifiers.  Location: Patient: Virtual Visit Location Patient:  Home Provider: Virtual Visit Location Provider: Home Office   I discussed the limitations of evaluation and management by telemedicine and the availability of in person appointments. The patient expressed understanding and agreed to proceed with visit.   History of Present Illness: Brian Spears is a 48 y.o. who identifies as a male who was assigned male at birth, and is being seen today for concerns about iron deficiency. States he has has increased fatigue. Would like to have recommendations    HPI: HPI  Problems:  Patient Active Problem List   Diagnosis Date Noted   History of Lyme disease 03/25/2019   Chronic migraine 03/10/2019   Generalized anxiety manifesting as headache 01/18/2019   Lateral epicondylitis, right elbow 12/15/2018   Plantar fasciitis, left 03/05/2018   Right shoulder pain 07/02/2017   Tinnitus, right 06/09/2016   Medial epicondylitis of right elbow 02/18/2016   Onychodystrophy 12/05/2014   Primary osteoarthritis of both elbows 12/05/2014   Elevated hemoglobin (HCC) 08/01/2013   Male hypogonadism 06/01/2013   GERD (gastroesophageal reflux disease) 03/01/2013   History of smoking 06/07/2012   Annual physical exam 06/07/2012   Celiac disease 06/30/2011   Hyperlipidemia 09/27/2010   Bilateral carpal tunnel syndrome 09/27/2010    Allergies:  Allergies  Allergen Reactions   Sulfa Antibiotics Other (See Comments)    headaches   Egg White (Diagnostic)    Gluten Meal     GI issues   Naproxen Sodium Swelling   Peanut-Containing Drug Products    Salmon [  Fish Oil]    Barley Grass Dermatitis, Diarrhea, Nausea And Vomiting and Rash   Corn-Containing Products Dermatitis, Diarrhea, Nausea And Vomiting and Rash   Crab (Diagnostic) Dermatitis, Diarrhea, Nausea And Vomiting and Rash   Oat Grain (Diagnostic) Dermatitis, Diarrhea, Nausea And Vomiting and Rash   Shellfish Allergy Dermatitis, Diarrhea, Nausea And Vomiting and Rash   Shrimp (Diagnostic) Dermatitis,  Diarrhea, Itching and Rash   Medications:  Current Outpatient Medications:    Ascorbic Acid (VITAMIN C WITH ROSE HIPS) 500 MG tablet, Take 500 mg by mouth in the morning and at bedtime., Disp: , Rfl:    COPPER PO, Take 3 mg by mouth., Disp: , Rfl:    DHEA 25 MG CAPS, Take by mouth., Disp: , Rfl:    Digestive Enzymes (SUPER ENZYMES PO), Take by mouth., Disp: , Rfl:    folic acid (FOLVITE) 1 MG tablet, Take 1 mg by mouth daily., Disp: , Rfl:    hydrOXYzine (VISTARIL) 25 MG capsule, Take 1 capsule (25 mg total) by mouth every 8 (eight) hours as needed., Disp: 30 capsule, Rfl: 0   Iron Combinations (CHROMAGEN) capsule, Take 1 capsule by mouth daily., Disp: , Rfl:    Iron Combinations (IRON COMPLEX PO), Take 10 mg by mouth., Disp: , Rfl:    LORazepam (ATIVAN) 1 MG tablet, Take 1 tablet (1 mg total) by mouth 2 (two) times daily as needed for anxiety., Disp: 60 tablet, Rfl: 3   MAGNESIUM MALATE PO, Take by mouth., Disp: , Rfl:    Misc Natural Products (BEET ROOT PO), Take 200 mg by mouth., Disp: , Rfl:    Omega-3 Fatty Acids (EPA PO), Take by mouth., Disp: , Rfl:    ondansetron (ZOFRAN-ODT) 8 MG disintegrating tablet, Take 1 tablet (8 mg total) by mouth 3 (three) times daily., Disp: 20 tablet, Rfl: 11   PREGNENOLONE MICRONIZED PO, Take by mouth., Disp: , Rfl:    tadalafil (CIALIS) 5 MG tablet, Take 5 mg by mouth daily as needed for erectile dysfunction., Disp: , Rfl:    testosterone cypionate (DEPOTESTOSTERONE CYPIONATE) 200 MG/ML injection, Inject 0.25 mLs (50 mg total) into the muscle every 3 (three) days., Disp: 10 mL, Rfl: 3   tobramycin-dexamethasone (TOBRADEX) ophthalmic solution, Place 1 drop into the right eye every 4 (four) hours while awake., Disp: 2.5 mL, Rfl: 0   VITAMIN D, CHOLECALCIFEROL, PO, Take by mouth., Disp: , Rfl:   Observations/Objective: Patient is well-developed, well-nourished in no acute distress.  Resting comfortably  at home.  Head is normocephalic, atraumatic.  No  labored breathing.  Speech is clear and coherent with logical content.  Patient is alert and oriented at baseline.    Assessment and Plan: 1. Other fatigue (Primary)  Recommended in person evaluation for labs. He verbalized Understanding and agreed tot his plan. Non toxic or acutely ill appearing at time of video visit.    Follow Up Instructions: I discussed the assessment and treatment plan with the patient. The patient was provided an opportunity to ask questions and all were answered. The patient agreed with the plan and demonstrated an understanding of the instructions.  A copy of instructions were sent to the patient via MyChart unless otherwise noted below.     The patient was advised to call back or seek an in-person evaluation if the symptoms worsen or if the condition fails to improve as anticipated.    Laure Kidney, PA-C

## 2023-11-19 ENCOUNTER — Ambulatory Visit (INDEPENDENT_AMBULATORY_CARE_PROVIDER_SITE_OTHER): Admitting: Sports Medicine

## 2023-11-19 ENCOUNTER — Ambulatory Visit

## 2023-11-19 VITALS — BP 128/80 | HR 89 | Temp 98.4°F | Resp 20 | Ht 68.0 in | Wt 205.1 lb

## 2023-11-19 DIAGNOSIS — Z0000 Encounter for general adult medical examination without abnormal findings: Secondary | ICD-10-CM

## 2023-11-19 DIAGNOSIS — N139 Obstructive and reflux uropathy, unspecified: Secondary | ICD-10-CM

## 2023-11-19 DIAGNOSIS — E291 Testicular hypofunction: Secondary | ICD-10-CM | POA: Diagnosis not present

## 2023-11-19 DIAGNOSIS — H8101 Meniere's disease, right ear: Secondary | ICD-10-CM

## 2023-11-19 DIAGNOSIS — M25511 Pain in right shoulder: Secondary | ICD-10-CM

## 2023-11-19 DIAGNOSIS — E7800 Pure hypercholesterolemia, unspecified: Secondary | ICD-10-CM

## 2023-11-19 DIAGNOSIS — H9311 Tinnitus, right ear: Secondary | ICD-10-CM | POA: Diagnosis not present

## 2023-11-19 DIAGNOSIS — G8929 Other chronic pain: Secondary | ICD-10-CM

## 2023-11-19 DIAGNOSIS — F411 Generalized anxiety disorder: Secondary | ICD-10-CM

## 2023-11-19 MED ORDER — HYDROXYZINE PAMOATE 25 MG PO CAPS
25.0000 mg | ORAL_CAPSULE | Freq: Three times a day (TID) | ORAL | 3 refills | Status: DC | PRN
Start: 2023-11-19 — End: 2024-01-29

## 2023-11-19 MED ORDER — LORAZEPAM 1 MG PO TABS
1.0000 mg | ORAL_TABLET | Freq: Two times a day (BID) | ORAL | 3 refills | Status: DC | PRN
Start: 2023-11-19 — End: 2024-04-28

## 2023-11-19 MED ORDER — TESTOSTERONE CYPIONATE 200 MG/ML IM SOLN
200.0000 mg | INTRAMUSCULAR | 3 refills | Status: DC
Start: 2023-11-19 — End: 2024-02-22

## 2023-11-19 NOTE — Assessment & Plan Note (Signed)
 Historically has declined Tdap and flu. He was established with another provider due to his insurance, but he is able to see [place] again. He did have a physical exam maybe a month ago so we will need to wait for about 11 months before we do it.

## 2023-11-19 NOTE — Assessment & Plan Note (Signed)
 No generalized anxiety. He has been fairly well-controlled with occasional hydroxyzine lorazepam, happy to refill this.

## 2023-11-19 NOTE — Progress Notes (Signed)
    Procedures performed today:    None.  Independent interpretation of notes and tests performed by another provider:   None.  Brief History, Exam, Impression, and Recommendations:    Right shoulder pain Recurrence of impingement symptoms, we last discussed this in 2022, he did fairly well with subacromial injection. He recently had a fall with increasing pain right shoulder, positive impingement signs on exam with good strength, we will get x-rays due to his fall, I would also like him to work on rotator cuff conditioning, return to see me in 6 weeks and we can consider another subacromial injection if not better.  Male hypogonadism Brexton is reestablishing with me, he had to see another provider due to his insurance, but he is able to see [place] again. He has been off of his testosterone, he was told it caused his hepatic steatosis, his LFTs have been normal along so I do not think this is the case, we will restart testosterone as he did feel good improvements in his energy. We will recheck his testosterone levels, CBC, PSA approximately 1 week after his third injection so approximately 5 weeks from now. Of note he did have some polycythemia historically with every 2 week dosing, we will try this again and if he develops polycythemia we will switch to every week dosing.  Meniere's disease He does have Mnire's disease diagnosed with ENT, also sensorineural hearing loss. He is currently managed by ENT and neurology and does have a methotrexate treatment coming up. Cochlear implant is also in the cards for him.  Generalized anxiety manifesting as headache No generalized anxiety. He has been fairly well-controlled with occasional hydroxyzine lorazepam, happy to refill this.  Annual physical exam Historically has declined Tdap and flu. He was established with another provider due to his insurance, but he is able to see [place] again. He did have a physical exam maybe a month ago so we will need  to wait for about 11 months before we do it.    ____________________________________________ Ihor Austin. Benjamin Stain, M.D., ABFM., CAQSM., AME. Primary Care and Sports Medicine [facility]  Adjunct Professor of Family Medicine  [employer] [facility] [employer] [employer] [facility] [employer]  Restaurant manager, fast food

## 2023-11-19 NOTE — Assessment & Plan Note (Signed)
 Brian Spears is reestablishing with me, he had to see another provider due to his insurance, but he is able to see [place] again. He has been off of his testosterone, he was told it caused his hepatic steatosis, his LFTs have been normal along so I do not think this is the case, we will restart testosterone as he did feel good improvements in his energy. We will recheck his testosterone levels, CBC, PSA approximately 1 week after his third injection so approximately 5 weeks from now. Of note he did have some polycythemia historically with every 2 week dosing, we will try this again and if he develops polycythemia we will switch to every week dosing.

## 2023-11-19 NOTE — Patient Instructions (Signed)
 Come back fasting for labs 1 week after your third testosterone injection, so about 5 weeks from now.

## 2023-11-19 NOTE — Assessment & Plan Note (Signed)
 He does have Mnire's disease diagnosed with ENT, also sensorineural hearing loss. He is currently managed by ENT and neurology and does have a methotrexate treatment coming up. Cochlear implant is also in the cards for him.

## 2023-11-19 NOTE — Assessment & Plan Note (Signed)
 Recurrence of impingement symptoms, we last discussed this in 2022, he did fairly well with subacromial injection. He recently had a fall with increasing pain right shoulder, positive impingement signs on exam with good strength, we will get x-rays due to his fall, I would also like him to work on rotator cuff conditioning, return to see me in 6 weeks and we can consider another subacromial injection if not better.

## 2023-11-24 ENCOUNTER — Encounter: Payer: Self-pay | Admitting: Sports Medicine

## 2023-11-25 NOTE — Telephone Encounter (Signed)
 Called radiology reading room - image changed to STAT

## 2023-11-25 NOTE — Telephone Encounter (Signed)
 Shldr x-ray form 11/19/23 not yet resulted. Would you like me to call and change to STAT ?

## 2023-11-25 NOTE — Telephone Encounter (Signed)
 Yes please

## 2023-11-30 ENCOUNTER — Encounter: Payer: Self-pay | Admitting: Gastroenterology

## 2023-12-21 ENCOUNTER — Encounter: Payer: Self-pay | Admitting: Sports Medicine

## 2023-12-25 ENCOUNTER — Telehealth: Payer: Self-pay

## 2023-12-25 LAB — LIPID PANEL
Chol/HDL Ratio: 4.9 ratio (ref 0.0–5.0)
Cholesterol, Total: 248 mg/dL — ABNORMAL HIGH (ref 100–199)
HDL: 51 mg/dL (ref >39)
LDL Chol Calc (NIH): 181 mg/dL — ABNORMAL HIGH (ref 0–99)
Triglycerides: 93 mg/dL (ref 0–149)
VLDL Cholesterol Cal: 16 mg/dL (ref 5–40)

## 2023-12-25 LAB — PSA, TOTAL AND FREE
PSA, Free Pct: 50 %
PSA, Free: 0.35 ng/mL
Prostate Specific Ag, Serum: 0.7 ng/mL (ref 0.0–4.0)

## 2023-12-25 LAB — CBC
Hematocrit: 50.8 % (ref 37.5–51.0)
Hemoglobin: 16.9 g/dL (ref 13.0–17.7)
MCH: 28.8 pg (ref 26.6–33.0)
MCHC: 33.3 g/dL (ref 31.5–35.7)
MCV: 87 fL (ref 79–97)
Platelets: 304 10*3/uL (ref 150–450)
RBC: 5.87 x10E6/uL — ABNORMAL HIGH (ref 4.14–5.80)
RDW: 13.4 % (ref 11.6–15.4)
WBC: 7.7 10*3/uL (ref 3.4–10.8)

## 2023-12-25 LAB — COMPREHENSIVE METABOLIC PANEL WITH GFR
ALT: 38 IU/L (ref 0–44)
AST: 26 IU/L (ref 0–40)
Albumin: 4.5 g/dL (ref 4.1–5.1)
Alkaline Phosphatase: 55 IU/L (ref 44–121)
BUN/Creatinine Ratio: 9 (ref 9–20)
BUN: 8 mg/dL (ref 6–24)
Bilirubin Total: 0.3 mg/dL (ref 0.0–1.2)
CO2: 24 mmol/L (ref 20–29)
Calcium: 9.9 mg/dL (ref 8.7–10.2)
Chloride: 101 mmol/L (ref 96–106)
Creatinine, Ser: 0.85 mg/dL (ref 0.76–1.27)
Globulin, Total: 2.8 g/dL (ref 1.5–4.5)
Glucose: 94 mg/dL (ref 70–99)
Potassium: 4.4 mmol/L (ref 3.5–5.2)
Sodium: 139 mmol/L (ref 134–144)
Total Protein: 7.3 g/dL (ref 6.0–8.5)
eGFR: 107 mL/min/{1.73_m2} (ref >59)

## 2023-12-25 LAB — TSH: TSH: 2.15 u[IU]/mL (ref 0.450–4.500)

## 2023-12-25 LAB — TESTOSTERONE, FREE, TOTAL, SHBG
Sex Hormone Binding: 38.9 nmol/L (ref 16.5–55.9)
Testosterone, Free: 19.8 pg/mL (ref 6.8–21.5)
Testosterone: 1249 ng/dL — ABNORMAL HIGH (ref 264–916)

## 2023-12-25 NOTE — Telephone Encounter (Signed)
 Chart has been updated. I confirmed he is taking prednisone  5 mg not 0.5 mg.

## 2023-12-25 NOTE — Telephone Encounter (Signed)
 I would prefer to use rosuvastatin, if ok with him.

## 2023-12-25 NOTE — Telephone Encounter (Signed)
 Copied from CRM [id number]. Topic: Clinical - Lab/Test Results >> Dec 25, 2023  2:51 PM Corin V wrote: Reason for CRM: Patient called to get lab results. Read provider note verbatim. Patient verbalized understanding and has no additional questions or concerns at this time. He stated he was open to taking a Torvastatin for a few months if it would bring his cholesterol down.

## 2023-12-25 NOTE — Telephone Encounter (Signed)
 Could not copy into lab result note - forwarding to provider regarding starting cholesterol lowering medication short term

## 2023-12-25 NOTE — Telephone Encounter (Signed)
 Note copied into result note

## 2023-12-25 NOTE — Telephone Encounter (Signed)
 Copied from CRM [id number]. Topic: Clinical - Medication Question >> Dec 25, 2023  2:52 PM Corin V wrote: Reason for CRM: patient stated that he noticed his medication list is missing prednisone  0.5mg  every other day. Dr. Jodelle Mungo prescribed it for him about 90 days ago.

## 2023-12-28 ENCOUNTER — Ambulatory Visit (INDEPENDENT_AMBULATORY_CARE_PROVIDER_SITE_OTHER): Admitting: Sports Medicine

## 2023-12-28 VITALS — BP 119/78 | HR 92 | Wt 214.0 lb

## 2023-12-28 DIAGNOSIS — E7800 Pure hypercholesterolemia, unspecified: Secondary | ICD-10-CM

## 2023-12-28 MED ORDER — ROSUVASTATIN CALCIUM 10 MG PO TABS: 10.0000 mg | ORAL_TABLET | Freq: Every day | ORAL | 3 refills | Status: DC

## 2023-12-28 NOTE — Telephone Encounter (Signed)
 Patient  was just seen in office 20 minutes before call and states he spoke with Dr. Sandy Crumb regarding this and blood work results as well.

## 2023-12-28 NOTE — Assessment & Plan Note (Signed)
 He does have hyperlipidemia, we discussed atherosclerosis, the pathophysiology, we will start low-dose Crestor, follow-up 3 months for fasting labs.

## 2023-12-28 NOTE — Progress Notes (Signed)
    Procedures performed today:    None.  Independent interpretation of notes and tests performed by another provider:   None.  Brief History, Exam, Impression, and Recommendations:    Hyperlipidemia He does have hyperlipidemia, we discussed atherosclerosis, the pathophysiology, we will start low-dose Crestor, follow-up 3 months for fasting labs.    ____________________________________________ Joselyn Nicely. Sandy Crumb, M.D., ABFM., CAQSM., AME. Primary Care and Sports Medicine [facility]  Adjunct Professor of [facility] Medicine  [employer]  Restaurant manager, fast food

## 2024-01-20 ENCOUNTER — Encounter: Payer: Self-pay | Admitting: Sports Medicine

## 2024-01-29 ENCOUNTER — Other Ambulatory Visit: Payer: Self-pay | Admitting: Sports Medicine

## 2024-01-29 DIAGNOSIS — F411 Generalized anxiety disorder: Secondary | ICD-10-CM

## 2024-02-22 ENCOUNTER — Encounter (INDEPENDENT_AMBULATORY_CARE_PROVIDER_SITE_OTHER): Payer: Self-pay | Admitting: Sports Medicine

## 2024-02-22 DIAGNOSIS — E291 Testicular hypofunction: Secondary | ICD-10-CM

## 2024-02-22 DIAGNOSIS — E7800 Pure hypercholesterolemia, unspecified: Secondary | ICD-10-CM | POA: Diagnosis not present

## 2024-02-22 MED ORDER — TESTOSTERONE CYPIONATE 200 MG/ML IM SOLN
200.0000 mg | INTRAMUSCULAR | 3 refills | Status: DC
Start: 2024-02-22 — End: 2024-04-28

## 2024-02-22 NOTE — Telephone Encounter (Signed)

## 2024-03-16 ENCOUNTER — Encounter: Payer: Self-pay | Admitting: Sports Medicine

## 2024-03-16 ENCOUNTER — Ambulatory Visit (INDEPENDENT_AMBULATORY_CARE_PROVIDER_SITE_OTHER): Admitting: Sports Medicine

## 2024-03-16 VITALS — BP 111/75 | HR 84 | Resp 20 | Ht 68.0 in

## 2024-03-16 DIAGNOSIS — E7800 Pure hypercholesterolemia, unspecified: Secondary | ICD-10-CM | POA: Diagnosis not present

## 2024-03-16 DIAGNOSIS — H8101 Meniere's disease, right ear: Secondary | ICD-10-CM | POA: Diagnosis not present

## 2024-03-16 DIAGNOSIS — E291 Testicular hypofunction: Secondary | ICD-10-CM | POA: Diagnosis not present

## 2024-03-16 DIAGNOSIS — F411 Generalized anxiety disorder: Secondary | ICD-10-CM | POA: Diagnosis not present

## 2024-03-16 MED ORDER — ROSUVASTATIN CALCIUM 10 MG PO TABS: 10.0000 mg | ORAL_TABLET | Freq: Every day | ORAL | 3 refills | Status: DC

## 2024-03-16 MED ORDER — BUSPIRONE HCL 5 MG PO TABS
ORAL_TABLET | ORAL | 3 refills | Status: DC
Start: 2024-03-16 — End: 2024-04-07

## 2024-03-16 NOTE — Assessment & Plan Note (Signed)
 Had some achiness on Crestor , we discontinued it to see if the achiness was related to Crestor  or something else, achiness continued so we will restart Crestor , 10 mg daily, recheck fasting lipids in 2 months. He does have the orders.

## 2024-03-16 NOTE — Assessment & Plan Note (Signed)
 Not to generalized anxiety, however a sensation of chest tightness is often triggered by Mnire symptoms. Has been occasionally controlled in the past with hydroxyzine  and/or lorazepam . He was tried on multiple SSRIs/SNRIs, the most recent of which venlafaxine, he is down to 25 mg, he does not like how this makes him feel so we will discontinue it and start BuSpar , BuSpar  has worked for him in the past. Return in 6 weeks to reevaluate on BuSpar .

## 2024-03-16 NOTE — Assessment & Plan Note (Signed)
 Mnire's disease diagnosed with ENT, sensorineural hearing loss, he does have follow up with ENT, neurology, rheumatology. He has been taking methotrexate. He does not endorse improvement with prednisone  and is eager to come off of it, we will discontinue prednisone . He we will work with his rheumatologist on methotrexate and whether to continue or discontinue. We will be somewhat hands-off regarding his Mnire's disease.

## 2024-03-16 NOTE — Assessment & Plan Note (Signed)
 Brodee tells me the majority of his symptoms have improved considerably with restarting testosterone . Most recent hemoglobin was in the 17's, we can continue this long-term.

## 2024-03-16 NOTE — Progress Notes (Signed)
    Procedures performed today:    None.  Independent interpretation of notes and tests performed by another provider:   None.  Brief History, Exam, Impression, and Recommendations:    Hyperlipidemia Had some achiness on Crestor , we discontinued it to see if the achiness was related to Crestor  or something else, achiness continued so we will restart Crestor , 10 mg daily, recheck fasting lipids in 2 months. He does have the orders.  Male hypogonadism Jamario tells me the majority of his symptoms have improved considerably with restarting testosterone . Most recent hemoglobin was in the 17's, we can continue this long-term.   Generalized anxiety manifesting as headache Not to generalized anxiety, however a sensation of chest tightness is often triggered by Mnire symptoms. Has been occasionally controlled in the past with hydroxyzine  and/or lorazepam . He was tried on multiple SSRIs/SNRIs, the most recent of which venlafaxine, he is down to 25 mg, he does not like how this makes him feel so we will discontinue it and start BuSpar , BuSpar  has worked for him in the past. Return in 6 weeks to reevaluate on BuSpar .    ____________________________________________ Debby PARAS. Curtis, M.D., ABFM., CAQSM., AME. Primary Care and Sports Medicine [facility]  Adjunct Professor of [facility] Medicine  [employer]  Restaurant manager, fast food

## 2024-04-04 ENCOUNTER — Encounter: Payer: Self-pay | Admitting: Sports Medicine

## 2024-04-07 ENCOUNTER — Other Ambulatory Visit: Payer: Self-pay | Admitting: Sports Medicine

## 2024-04-07 DIAGNOSIS — F411 Generalized anxiety disorder: Secondary | ICD-10-CM

## 2024-04-27 ENCOUNTER — Ambulatory Visit: Admitting: Sports Medicine

## 2024-04-28 ENCOUNTER — Ambulatory Visit (INDEPENDENT_AMBULATORY_CARE_PROVIDER_SITE_OTHER): Admitting: Urgent Care

## 2024-04-28 ENCOUNTER — Encounter: Payer: Self-pay | Admitting: Urgent Care

## 2024-04-28 VITALS — BP 136/81 | HR 95 | Resp 17 | Ht 68.0 in | Wt 211.8 lb

## 2024-04-28 DIAGNOSIS — H9311 Tinnitus, right ear: Secondary | ICD-10-CM | POA: Diagnosis not present

## 2024-04-28 DIAGNOSIS — D582 Other hemoglobinopathies: Secondary | ICD-10-CM | POA: Diagnosis not present

## 2024-04-28 DIAGNOSIS — F411 Generalized anxiety disorder: Secondary | ICD-10-CM | POA: Diagnosis not present

## 2024-04-28 DIAGNOSIS — E291 Testicular hypofunction: Secondary | ICD-10-CM

## 2024-04-28 DIAGNOSIS — E7800 Pure hypercholesterolemia, unspecified: Secondary | ICD-10-CM

## 2024-04-28 MED ORDER — HYDROXYZINE PAMOATE 25 MG PO CAPS: 25.0000 mg | ORAL_CAPSULE | Freq: Three times a day (TID) | ORAL | 3 refills | Status: AC | PRN

## 2024-04-28 MED ORDER — TESTOSTERONE CYPIONATE 200 MG/ML IM SOLN: 200.0000 mg | INTRAMUSCULAR | 3 refills | Status: DC

## 2024-04-28 MED ORDER — LORAZEPAM 1 MG PO TABS: 1.0000 mg | ORAL_TABLET | Freq: Two times a day (BID) | ORAL | 3 refills | Status: DC | PRN

## 2024-04-28 MED ORDER — BUSPIRONE HCL 5 MG PO TABS: 5.0000 mg | ORAL_TABLET | Freq: Three times a day (TID) | ORAL | 2 refills | Status: AC

## 2024-04-28 NOTE — Patient Instructions (Addendum)
 We refilled your medications. Please review mychart for communications regarding your labs. Please return in 3-4 months

## 2024-04-29 ENCOUNTER — Encounter: Payer: Self-pay | Admitting: Urgent Care

## 2024-04-29 ENCOUNTER — Ambulatory Visit: Payer: Self-pay | Admitting: Urgent Care

## 2024-04-29 LAB — BASIC METABOLIC PANEL WITH GFR
BUN/Creatinine Ratio: 11 (ref 9–20)
BUN: 12 mg/dL (ref 6–24)
CO2: 21 mmol/L (ref 20–29)
Calcium: 9.4 mg/dL (ref 8.7–10.2)
Chloride: 100 mmol/L (ref 96–106)
Creatinine, Ser: 1.1 mg/dL (ref 0.76–1.27)
Glucose: 84 mg/dL (ref 70–99)
Potassium: 4.1 mmol/L (ref 3.5–5.2)
Sodium: 134 mmol/L (ref 134–144)
eGFR: 82 mL/min/1.73 (ref >59)

## 2024-04-29 LAB — LIPID PANEL
Chol/HDL Ratio: 5.5 ratio — ABNORMAL HIGH (ref 0.0–5.0)
Cholesterol, Total: 208 mg/dL — ABNORMAL HIGH (ref 100–199)
HDL: 38 mg/dL — ABNORMAL LOW (ref >39)
LDL Chol Calc (NIH): 133 mg/dL — ABNORMAL HIGH (ref 0–99)
Triglycerides: 204 mg/dL — ABNORMAL HIGH (ref 0–149)
VLDL Cholesterol Cal: 37 mg/dL (ref 5–40)

## 2024-04-29 LAB — CBC
Hematocrit: 53 % — ABNORMAL HIGH (ref 37.5–51.0)
Hemoglobin: 17.4 g/dL (ref 13.0–17.7)
MCH: 28.6 pg (ref 26.6–33.0)
MCHC: 32.8 g/dL (ref 31.5–35.7)
MCV: 87 fL (ref 79–97)
Platelets: 276 x10E3/uL (ref 150–450)
RBC: 6.09 x10E6/uL — ABNORMAL HIGH (ref 4.14–5.80)
RDW: 14.3 % (ref 11.6–15.4)
WBC: 6.8 x10E3/uL (ref 3.4–10.8)

## 2024-04-29 NOTE — Progress Notes (Signed)
 Established Patient Office Visit  Subjective:  Patient ID: Brian Spears, male    DOB: February 17, 1976  Age: 48 y.o. MRN: [id number]  Chief Complaint  Patient presents with   Medical Management of Chronic Issues    Pt is here for med refills, and full panel bloodwork    HPI  Discussed the use of AI scribe software for clinical note transcription with the patient, who gave verbal consent to proceed.  History of Present Illness   Brian Spears is a 48 year old male with Meniere's disease and psoriasis who presents with concerns about methotrexate treatment.  He has experienced symptoms of Meniere's disease, including tinnitus, hearing loss, and dizziness, for several years. These symptoms worsened significantly in February two years ago, leading to hospitalization. Despite various treatments, his symptoms persist, and he is currently on methotrexate as a last resort. Methotrexate has not been effective in controlling his symptoms, and he is awaiting further decisions from his other doctors regarding his treatment.  He has a history of psoriasis, which flared up after discontinuing prednisone  three months prior to starting methotrexate. The psoriasis has led to joint pain and discomfort. Methotrexate has provided some relief on the left side, but overall, his symptoms have not improved significantly.  He is on several maintenance medications, including buspirone  5 mg three times daily, lorazepam  1 mg twice daily as needed, hydroxyzine  up to every eight hours as needed, and testosterone  200 mg every 14 days. Hydroxyzine  helps manage his vertigo, and he uses it variably depending on his symptoms.  He recently started rosuvastatin  for cholesterol management but experienced muscle pain and cramps, leading to a temporary discontinuation. He previously tolerated atorvastatin  well. His cholesterol was noted to be elevated at 181 mg/dL.       Patient Active Problem List   Diagnosis Date Noted    History of Lyme disease 03/25/2019   Chronic migraine 03/10/2019   Generalized anxiety manifesting as headache 01/18/2019   Lateral epicondylitis, right elbow 12/15/2018   Plantar fasciitis, left 03/05/2018   Right shoulder pain 07/02/2017   Meniere's disease 06/09/2016   Medial epicondylitis of right elbow 02/18/2016   Onychodystrophy 12/05/2014   Primary osteoarthritis of both elbows 12/05/2014   Elevated hemoglobin (HCC) 08/01/2013   Male hypogonadism 06/01/2013   GERD (gastroesophageal reflux disease) 03/01/2013   History of smoking 06/07/2012   Annual physical exam 06/07/2012   Celiac disease 06/30/2011   Hyperlipidemia 09/27/2010   Bilateral carpal tunnel syndrome 09/27/2010   Past Medical History:  Diagnosis Date   Anxiety    Arthritis    Celiac disease    Hand fracture    Headache    Hyperlipidemia    Lyme disease    Meniere disease    Migraines    Normal cardiac stress test 11/2008   at [facility] - negative.  terminated for fatigue   Thyroid  disease    Past Surgical History:  Procedure Laterality Date   COLONOSCOPY  05/08/2023   Vito Cirigliano   COLONOSCOPY W/ POLYPECTOMY  2021   TA SS   surgery on right hand Bilateral    Social History   Tobacco Use   Smoking status: Former    Current packs/day: 0.20    Types: Cigarettes   Smokeless tobacco: Former    Quit date: 06/02/2011   Tobacco comments:    started smoking at age 89, started smokeless tobacco at age 12  Vaping Use   Vaping status: Some Days  Substance Use Topics  Alcohol use: No   Drug use: No      ROS: as noted in HPI  Objective:     BP 136/81   Pulse 95   Resp 17   Ht 5' 8 (1.727 m)   Wt 211 lb 12 oz (96 kg)   SpO2 98%   BMI 32.20 kg/m  BP Readings from Last 3 Encounters:  04/28/24 136/81  03/16/24 111/75  12/28/23 119/78   Wt Readings from Last 3 Encounters:  04/28/24 211 lb 12 oz (96 kg)  12/28/23 214 lb (97.1 kg)  11/19/23 205 lb 1.3 oz (93 kg)      Physical  Exam Vitals and nursing note reviewed.  Constitutional:      General: He is not in acute distress.    Appearance: Normal appearance. He is not ill-appearing, toxic-appearing or diaphoretic.  HENT:     Head: Normocephalic and atraumatic.     Right Ear: Ear canal and external ear normal. No middle ear effusion. There is no impacted cerumen. Tympanic membrane is scarred.     Left Ear: Tympanic membrane, ear canal and external ear normal.  No middle ear effusion. There is no impacted cerumen.     Nose: Nose normal.     Mouth/Throat:     Mouth: Mucous membranes are moist.     Pharynx: Oropharynx is clear. No oropharyngeal exudate or posterior oropharyngeal erythema.  Eyes:     General: No scleral icterus.       Right eye: No discharge.        Left eye: No discharge.     Extraocular Movements: Extraocular movements intact.     Pupils: Pupils are equal, round, and reactive to light.  Neck:     Thyroid : No thyroid  mass, thyromegaly or thyroid  tenderness.  Cardiovascular:     Rate and Rhythm: Normal rate and regular rhythm.     Pulses: Normal pulses.     Heart sounds: No murmur heard. Pulmonary:     Effort: Pulmonary effort is normal. No respiratory distress.     Breath sounds: Normal breath sounds. No stridor. No wheezing or rhonchi.  Musculoskeletal:     Cervical back: Normal range of motion and neck supple. No rigidity or tenderness.     Right lower leg: No edema.     Left lower leg: No edema.  Lymphadenopathy:     Cervical: No cervical adenopathy.  Skin:    General: Skin is warm and dry.     Coloration: Skin is not jaundiced.     Findings: No bruising, erythema or rash.  Neurological:     General: No focal deficit present.     Mental Status: He is alert and oriented to person, place, and time.  Psychiatric:        Mood and Affect: Mood normal.        Behavior: Behavior normal.      No results found for any visits on 04/28/24.  Last CBC Lab Results  Component Value Date    WBC 7.7 12/24/2023   HGB 16.9 12/24/2023   HCT 50.8 12/24/2023   MCV 87 12/24/2023   MCH 28.8 12/24/2023   RDW 13.4 12/24/2023   PLT 304 12/24/2023   Last metabolic panel Lab Results  Component Value Date   GLUCOSE 94 12/24/2023   NA 139 12/24/2023   K 4.4 12/24/2023   CL 101 12/24/2023   CO2 24 12/24/2023   BUN 8 12/24/2023   CREATININE 0.85 12/24/2023   EGFR  107 12/24/2023   CALCIUM  9.9 12/24/2023   PROT 7.3 12/24/2023   ALBUMIN 4.5 12/24/2023   LABGLOB 2.8 12/24/2023   BILITOT 0.3 12/24/2023   ALKPHOS 55 12/24/2023   AST 26 12/24/2023   ALT 38 12/24/2023      The 10-year ASCVD risk score (Arnett DK, et al., 2019) is: 4.6%  Assessment & Plan:  Pure hypercholesterolemia -     Lipid panel  Generalized anxiety disorder -     busPIRone  HCl; Take 1 tablet (5 mg total) by mouth 3 (three) times daily.  Dispense: 270 tablet; Refill: 2 -     hydrOXYzine  Pamoate; Take 1 capsule (25 mg total) by mouth every 8 (eight) hours as needed.  Dispense: 90 capsule; Refill: 3 -     LORazepam ; Take 1 tablet (1 mg total) by mouth 2 (two) times daily as needed for anxiety.  Dispense: 60 tablet; Refill: 3  Tinnitus, right -     LORazepam ; Take 1 tablet (1 mg total) by mouth 2 (two) times daily as needed for anxiety.  Dispense: 60 tablet; Refill: 3  Male hypogonadism -     Testosterone  Cypionate; Inject 1 mL (200 mg total) into the muscle every 14 (fourteen) days.  Dispense: 6 mL; Refill: 3  Elevated hemoglobin (HCC) -     CBC -     Basic metabolic panel with GFR  Assessment and Plan    Meniere's disease with bilateral tinnitus Persistent severe symptoms with partial response to methotrexate, effective on left side only. - Await specialist's decision on methotrexate management.  Psoriatic arthritis (psoriasis with joint involvement) Significant joint involvement and psoriasis exacerbated by methotrexate, causing discomfort and autoimmune issues.  Generalized anxiety  disorder Managed with buspirone , lorazepam , and hydroxyzine  for anxiety and vertigo. - Continue buspirone  5 mg three times daily. - Continue lorazepam  1 mg twice daily as needed. - Continue hydroxyzine  up to every 8 hours as needed.  Testicular hypofunction on testosterone  therapy Testosterone  therapy maintaining hemoglobin levels; recent high testosterone  levels due to injection timing. - Continue testosterone  1 mL (200 mg) every 14 days.  Pure hypercholesterolemia Elevated cholesterol at 181 mg/dL; atorvastatin  previously tolerated without issues.         Return in about 4 months (around 08/28/2024).   Benton LITTIE Gave, PA

## 2024-05-03 ENCOUNTER — Encounter: Payer: Self-pay | Admitting: Sports Medicine

## 2024-05-10 ENCOUNTER — Ambulatory Visit (INDEPENDENT_AMBULATORY_CARE_PROVIDER_SITE_OTHER): Admitting: Urgent Care

## 2024-05-10 VITALS — BP 114/74 | HR 90 | Ht 68.0 in | Wt 210.0 lb

## 2024-05-10 DIAGNOSIS — G720 Drug-induced myopathy: Secondary | ICD-10-CM

## 2024-05-10 DIAGNOSIS — R197 Diarrhea, unspecified: Secondary | ICD-10-CM | POA: Diagnosis not present

## 2024-05-10 DIAGNOSIS — T466X5A Adverse effect of antihyperlipidemic and antiarteriosclerotic drugs, initial encounter: Secondary | ICD-10-CM

## 2024-05-10 DIAGNOSIS — H8101 Meniere's disease, right ear: Secondary | ICD-10-CM

## 2024-05-10 DIAGNOSIS — E7800 Pure hypercholesterolemia, unspecified: Secondary | ICD-10-CM

## 2024-05-10 DIAGNOSIS — F411 Generalized anxiety disorder: Secondary | ICD-10-CM

## 2024-05-10 DIAGNOSIS — E291 Testicular hypofunction: Secondary | ICD-10-CM

## 2024-05-10 MED ORDER — CHOLESTYRAMINE LIGHT 4 G PO POWD: 4.0000 g | Freq: Two times a day (BID) | ORAL | 0 refills | Status: DC

## 2024-05-10 NOTE — Patient Instructions (Signed)
Dec 11

## 2024-05-10 NOTE — Progress Notes (Unsigned)
 Established Patient Office Visit  Subjective:  Patient ID: Brian Spears, male    DOB: [birth year]  Age: 48 y.o. MRN: [id number]  Chief Complaint  Patient presents with   Follow-up    GI issues and statin use    Pleasant 48yo male presents today to discuss chronic, ongoing issues.  Had contemplated transitioning to [facility], but after a negative experience, would like to keep his care through [facility]. He does remain under the care of an ENT at [facility].  Pt has been on methotrexate for menieres disease. He recently stopped taking it as he has developed severe GI issues from it. Despite cessation, the GI sx persist, namely diarrhea. He has a hx of celiac disease dx years ago which is managed with dietary changes.  Additionally, pt suffers from GAD. This was spurred on because of his menieres. He had tried numerous ssris in the past, but couldn't tolerate any of them. Subsequently he takes ativan  and buspar  for his sx, which is overall controlled.  He has been intermittently on statins to help control his elevated cholesterol levels. Unforunately, he cannot tolerate them due to severe myalgias. He would like to stay off the statins for some time and recheck levels fasting in a few months to determine next best course of action. He is amenable to OTC red yeast rice and is already taking fish oil.  Pt is on testosterone  replacement therapy. He is doing 200mg  every 14 days. He gives the injection every Thursday. Last draw in April showed T level >1200 but this may have been due to inappropriate timing of lab draw to injection. He will be due to recheck in dec.     Patient Active Problem List   Diagnosis Date Noted   Statin myopathy 05/11/2024   History of Lyme disease 03/25/2019   Chronic migraine 03/10/2019   Generalized anxiety manifesting as headache 01/18/2019   Lateral epicondylitis, right elbow 12/15/2018   Plantar fasciitis, left 03/05/2018   Right shoulder pain 07/02/2017    Meniere's disease 06/09/2016   Medial epicondylitis of right elbow 02/18/2016   Onychodystrophy 12/05/2014   Primary osteoarthritis of both elbows 12/05/2014   Elevated hemoglobin (HCC) 08/01/2013   Male hypogonadism 06/01/2013   GERD (gastroesophageal reflux disease) 03/01/2013   History of smoking 06/07/2012   Annual physical exam 06/07/2012   Celiac disease 06/30/2011   Hyperlipidemia 09/27/2010   Bilateral carpal tunnel syndrome 09/27/2010   Past Medical History:  Diagnosis Date   Anxiety    Arthritis    Celiac disease    Hand fracture    Headache    Hyperlipidemia    Lyme disease    Meniere disease    Migraines    Normal cardiac stress test 11/2008   at [facility] - negative.  terminated for fatigue   Thyroid  disease    Past Surgical History:  Procedure Laterality Date   COLONOSCOPY  05/08/2023   Vito Cirigliano   COLONOSCOPY W/ POLYPECTOMY  2021   TA SS   surgery on right hand Bilateral    Social History   Tobacco Use   Smoking status: Former    Current packs/day: 0.20    Types: Cigarettes   Smokeless tobacco: Former    Quit date: 06/02/2011   Tobacco comments:    started smoking at age 25, started smokeless tobacco at age 23  Vaping Use   Vaping status: Some Days  Substance Use Topics   Alcohol use: No   Drug use: No  ROS: as noted in HPI  Objective:     BP 114/74   Pulse 90   Ht 5' 8 (1.727 m)   Wt 210 lb (95.3 kg)   SpO2 96%   BMI 31.93 kg/m  BP Readings from Last 3 Encounters:  05/10/24 114/74  04/28/24 136/81  03/16/24 111/75   Wt Readings from Last 3 Encounters:  05/10/24 210 lb (95.3 kg)  04/28/24 211 lb 12 oz (96 kg)  12/28/23 214 lb (97.1 kg)      Physical Exam Vitals and nursing note reviewed.  Constitutional:      General: He is not in acute distress.    Appearance: Normal appearance. He is not ill-appearing, toxic-appearing or diaphoretic.  HENT:     Head: Normocephalic and atraumatic.     Right Ear: External ear  normal.     Left Ear: External ear normal.     Nose: Nose normal.  Eyes:     General: No scleral icterus.    Pupils: Pupils are equal, round, and reactive to light.  Cardiovascular:     Rate and Rhythm: Normal rate.  Pulmonary:     Effort: Pulmonary effort is normal. No respiratory distress.  Skin:    General: Skin is warm and dry.     Findings: No erythema or rash.  Neurological:     General: No focal deficit present.     Mental Status: He is alert and oriented to person, place, and time.      No results found for any visits on 05/10/24.  Last CBC Lab Results  Component Value Date   WBC 6.8 04/28/2024   HGB 17.4 04/28/2024   HCT 53.0 (H) 04/28/2024   MCV 87 04/28/2024   MCH 28.6 04/28/2024   RDW 14.3 04/28/2024   PLT 276 04/28/2024   Last metabolic panel Lab Results  Component Value Date   GLUCOSE 84 04/28/2024   NA 134 04/28/2024   K 4.1 04/28/2024   CL 100 04/28/2024   CO2 21 04/28/2024   BUN 12 04/28/2024   CREATININE 1.10 04/28/2024   EGFR 82 04/28/2024   CALCIUM  9.4 04/28/2024   PROT 7.3 12/24/2023   ALBUMIN 4.5 12/24/2023   LABGLOB 2.8 12/24/2023   BILITOT 0.3 12/24/2023   ALKPHOS 55 12/24/2023   AST 26 12/24/2023   ALT 38 12/24/2023   Last lipids Lab Results  Component Value Date   CHOL 208 (H) 04/28/2024   HDL 38 (L) 04/28/2024   LDLCALC 133 (H) 04/28/2024   LDLDIRECT 113 06/09/2016   TRIG 204 (H) 04/28/2024   CHOLHDL 5.5 (H) 04/28/2024   Last hemoglobin A1c Lab Results  Component Value Date   HGBA1C 5.6 04/20/2023      The 10-year ASCVD risk score (Arnett DK, et al., 2019) is: 3.6%  Assessment & Plan:  Generalized anxiety disorder  Meniere's disease of right ear  Male hypogonadism  Pure hypercholesterolemia  Diarrhea, unspecified type -     Cholestyramine  Light; Take 1 packet (4 g total) by mouth 2 (two) times daily.  Dispense: 231 g; Refill: 0  Statin myopathy  GAD appears well controlled on current regimen including  buspar , PRN ativan  and atarax   Diarrhea, which is believed to be methotrexate induced despite cessation. Will do trial of cholestyramine  to help bulken stools, which may even help with cholesterol management to some degree.  Pt now off statins. He will try otc red yeast rice and fish oil.  Pt is due to repeat T levels  in a few months.   No follow-ups on file.   Benton LITTIE Gave, PA

## 2024-05-11 ENCOUNTER — Encounter: Payer: Self-pay | Admitting: Urgent Care

## 2024-05-11 DIAGNOSIS — G720 Drug-induced myopathy: Secondary | ICD-10-CM | POA: Insufficient documentation

## 2024-05-11 NOTE — Progress Notes (Signed)
 05/12/2024   Location: [facility] at [place]  Precautions: Gloves were worn during the patient's evaluation. SARS-CoV-2                        precautions were followed.   Complex patient, new to me,  but return to the department, has been evaluated by many specialists  Provider: Camellia EMERSON Milliner, M.D., M.S., F.A.C.S.  Visit Type:  Return Adult  Encounter Date: May 12, 2024  Patient Name:  Brian Spears     Medical Record Number (MRN): [id number]                          Sex:  male  Date of Birth:  06/24/1976                                Age: 48 y.o.  Phone number(s): [phone] (home)                       [phone] (mobile)  Primary Dr:  Nestora Bulah Lesches, FNP  Referring Provider: No ref. provider found  Person(s) accompanying patient: unaccompanied  Interpreter: None    PROBLEM LIST    Patient Active Problem List  Diagnosis  . Tinnitus, right  . Rheumatoid arthritis    (CMD)  . Primary osteoarthritis of both elbows  . Plantar fasciitis, left  . Onychodystrophy  . Active Meniere disease of right ear  . Male hypogonadism  . Lyme arthritis    (CMD)  . Medial epicondylitis of right elbow  . Lateral epicondylitis, right elbow  . Hyperlipidemia  . History of smoking  . GERD (gastroesophageal reflux disease)  . Generalized anxiety disorder  . Celiac disease (CMD)  . SNHL (sensory-neural hearing loss), asymmetrical  . Chronic ethmoidal sinusitis  . Sensorineural hearing loss (SNHL) of right ear with restricted hearing of left ear     Chief Complaint  Patient presents with  . Follow-up    6 month     Brian Spears  is a 48 y.o. male who reports to the [facility] today with the chief complaint right Mnire's disease.   HPI    09/10/2023 HPI:  This is his first visit to see me at the Otolaryngology Clinic in [place].  The patient was referred by Chiquita Kurk,  PA-C.   06/16/23: The patient was evaluated by Dr. Sioshansi on 06/16/2023.  The patient had been diagnosed in February 2022 with right Mnire's disease following symptoms that occurred following an episode of Lyme disease that occurred in 2013.  Symptoms included aural fullness, hearing loss and vision changes.  He had difficulty walking that led to a hospital visit and referral to a vestibular specialist.  His symptoms have remained relatively constant with spells of dizziness, vertigo, and a sense of motion when stationary.  Stress would exacerbate his symptoms with increase in aural pressure, tinnitus, dizziness, and vomiting.  He had been managing symptoms with betahistine, lorazepam  and hydroxyzine  which did help to reduce the frequency and severity of his spells.  He had a history of intolerance to oral steroids which caused vomiting.  He did have a history of ear infections and had to had tubes inserted in both ears.  It also been diagnosed as having a scalp melanoma and celiac disease.  He had  had VNG testing 1 year prior to seeing Dr. Sioshansi.  There was no history of noise exposure, barotrauma, ototoxic medication, previous ear operations other than the tubes, or family history of hearing loss or ear disease.  An extruded tube was removed from the right ear canal and he had a stable right tympanic membrane.  He had a clear and mobile left tympanic membrane.  Facial function was intact and symmetric.  Audiometric testing showed asymmetric sensorineural hearing loss with an SRT of 60 dB in the right ear with 48% word recognition and a mild to moderate sensorineural hearing loss in the left ear with an SRT of 30 DB and 92% word recognition.  He was continued on betahistine, hydroxyzine  nightly and lorazepam  1 mg twice daily.  He was recommended for lifestyle modification such as reducing caffeine intake, hydration, improve sleep quality, and stress management.  He was recommended for  intratympanic dexamethasone  injection if the conservative medical management did not control his symptoms.    07/14/2023 clinic visit with Chiquita Kurk, PA-C  The patient was evaluated by Chiquita for a possible acute sinusitis.  He had seen his PCP 1 month prior to the visit and had been treated with a course of Augmentin .  He reported a beta histamine  caused severe constipation and he decreased the dose to twice a day and hydroxyzine  once a day.  He was tolerating lorazepam  for severe vertigo.  It had 1 severe episode of vertigo 1 week prior to his visit and his last full attack had been in July 2024.  He did report daily headaches since 2020 with light and sound sensitivity.  He was placed on a 10-day course of doxycycline , mometasone nasal spray, saline nasal rinses, and recommended for CT facial bone scan to check his sinuses as well as referral to neurology for treatment of migraine disease.  Today, the patient reports that he develops an adrenaline type rush, almost an aura, before he develops a Mnire's episode.  He becomes very panicked and nervous.  Most of his symptoms are right-sided but he has developed some occasional left-sided symptoms.  He is taking some type of herbal protocol which he feels has helped him he calls this NAC.  Sometimes he develops pain from his right occiput that radiates down his neck and into his chest to his xiphoid process.  His PCP has placed him on Valium  which has caused him to feel like he needs to jump off a roof.  He has tolerated lorazepam  but would like to taper off the medication.  He took Xanax  for years.  He has not had any intratympanic dexamethasone  or gentamicin injections.  He denied any previous ear operations other than tympanostomy tubes as a child.  His family history was negative for Mnire's disease.  His paternal grandfather was deaf in his left ear.  Patient has had vestibular testing in the past.  His last major spell of vertigo was in  November 2024 he reports that it lasted for almost an entire month of November into December.  It may be that he has been experiencing some migraine disease.  He has been unable to tolerate large doses of prednisone  but he is willing to take a low-dose every other day.    09/23/2023 evaluation by Dr. Clayton for chronic ethmoid sinusitis  Patient was determined to have chronic ethmoid sinusitis and along with dehiscence in the sphenoid sinus, not considered a risk factor for ocular complications.  He was to continue on Xlear  and saline mist, bactroban, and referral to Allergy  Immunology.    10/22/2023 HPI:  Since last being evaluated by me, the patient has seen Dr. Clayton for his chronic sinusitis and Dr. Jama Bearded of neurology. Dr. Bearded placed him on  butalbital, acetaminophen  and caffeine pills because insurance refused to pay for Ubrelvy.   He is scheduled to see Dr. Vernell Geralds of rheumatology in May 2025 to begin methotrexate treatment.  Today, the patient reports that he experienced a Mnire spell yesterday around 1:00.  He developed a headache was under significant stress and had to slide down the stairs.  He did have vertigo with nausea but no vomiting.  He has been started on nortriptyline for migraines.  He continues to report right aural fullness and pressure.  He also has had a history of polycythemia for the past year with his hemoglobin being 19.2.  He gave blood and his hemoglobin dropped to 17.  He was on thyroxine and testosterone  that were both stopped.  He had a difficult time and the medications were stopped but is now starting to feel somewhat better.  He continues on prednisone  5 mg every other day.  He recently reports some flulike symptoms with congestion and coughing and his joints seem to be flaring.  He is not scheduled to see Dr. Geralds until May 2025.  He was fit with a hearing aid for his right ear at [facility] but reports only a vibration type sound in his right ear  and he stopped wearing the hearing aid.  He is having increasing difficulty hearing and is unable to localize.    04/21/2024 HPI: patient left before being evaluated.   05/12/2024 HPI:  Today, the patient reports that he tried methotrexate for 4 months he could not tolerate the medication.  He felt worse on the medication and off the medication.  He experienced some vomiting and diarrhea for 72 hours.  Is known to have celiac disease and had to have a Lyme disease.  He feels that his GI tract is raw.  He has an appointment on 05/17/2024 to see his gastroenterologist as he has had some blood in his stool.  He just recently underwent a colonoscopy and reportedly had some polyps excised.  He has a new PCP who placed him back on prednisone  5 mg every other day.  He has not been eating meals for days and is having difficulty hydrating himself.  He did not report weight loss.  He does not like his hearing aid.  He tried mometasone and developed tachycardia.  He denied spells of true vertigo but continues to feel pressure in his right ear along with noise.  He reports nonstop tinnitus that has not escalated.  He is watching his sodium in his diet but has not been drinking Pedialyte.    ALLERGIES/MEDS/PMH/PSH/FAMHX/SOCHX    Allergies  Allergen Reactions  . Tramadol Itching and Other (See Comments)    Skin crawl  . Naproxen Swelling  . Shellfish Containing Products GI Intolerance  . Buckwheat Flushing, GI Bleeding and GI Intolerance  . Egg White Dermatitis, Diarrhea, Flushing, GI Bleeding and Itching  . Gluten     GI issues. Celiac's d/o  . Sulfa (Sulfonamide Antibiotics) Dermatitis, Diarrhea, Flushing, GI Bleeding, GI Intolerance, Hives, Itching and Swelling  . Barley Dermatitis, Diarrhea, Flushing, GI Bleeding, GI Intolerance, Nausea And Vomiting and Rash     Current Outpatient Medications  Medication Sig Dispense Refill  . ascorbic acid (VITAMIN C) 500 mg tablet  Take 500 mg by mouth daily.     . busPIRone  (BUSPAR ) 5 mg tablet 1 tablet p.o. nightly for a week then twice daily for a week then 3 times daily (Patient taking differently: Take 5 mg by mouth 3 (three) times a day.)    . butalbital-acetaminophen -caffeine (FIORICET) 50-325-40 mg per tablet 1-2 p.o. daily prn severe headache. Do not use more than 3 times a week. 20 tablet 2  . enzymes,digestive (DIGESTIVE ENZYMES ORAL) Take by mouth 2 (two) times a day.    . folic acid  (FOLVITE ) 1 mg tablet Take 1 tablet (1 mg total) by mouth daily. 90 tablet 3  . hydrOXYzine  (ATARAX ) 25 mg tablet Take 1 tablet by mouth daily.    . L. acidophilus/Bifid. animalis 32 billion cell cap Take 1 capsule by mouth every morning.    . LORazepam  (ATIVAN ) 1 mg tablet Take 1 mg by mouth 2 (two) times a day.    SABRA MAGNESIUM MALATE, BULK, MISC 3 tablets by miscellaneous route at bedtime.    SABRA omega-3 fatty acids-fish oil (FISH OIL) 340-1,000 mg cap capsule Take 1 g by mouth daily.    . ondansetron  (ZOFRAN -ODT) 4 mg disintegrating tablet Dissolve 4 mg on tongue every 8 (eight) hours as needed.    . predniSONE  (DELTASONE ) 5 mg tablet Take 1 tablet (5 mg total) by mouth every other day. 15 tablet 0  . promethazine  (PHENERGAN ) 25 mg suppository Insert 25 mg into the rectum as needed.    . testosterone  cypionate (DEPO-TESTOSTERONE ) 200 mg/mL injection INJECT 1 ML (200 MG TOTAL) INTO THE MUSCLE EVERY 14 DAYS (Patient taking differently: Inject 200 mg into the muscle every 14 (fourteen) days.)    . UNABLE TO FIND Med Name: Leopold extract ( bronchial dilator) (Patient taking differently: as needed. Med Name: Leopold extract ( bronchial dilator))    . promethazine  (PHENERGAN ) 25 mg suppository Use one suppository every 8 hours as needed for nausea 6 suppository 1   No current facility-administered medications for this visit.     Past Medical History:  Diagnosis Date  . Cancer    (CMD)   . Cluster headache   . Fibromyalgia, primary   . Head injury    2 concussions   . Headache, tension-type   . HL (hearing loss)   . Memory loss   . Meniere's disease   . Migraine   . Tinnitus      Past Surgical History:  Procedure Laterality Date  . CARPAL TUNNEL RELEASE    . TYMPANOSTOMY TUBE PLACEMENT Bilateral      Family History  Problem Relation Name Age of Onset  . Dementia Paternal Uncle Benjerman Molinelli       reports that he has never smoked. He quit smokeless tobacco use about 8 years ago.  His smokeless tobacco use included snuff. He reports that he does not drink alcohol and does not use drugs.    ROS     A complete ROS was performed with pertinent positives/negatives noted in the HPI.    + dyshidrotic eczema,  Rheumatic fever several times when young.  Surgery for melanoma on scalp a few months ago. Plantar fascitis Celiac Lyme disease  The remainder of the ROS are negative  Prior to this encounter, I have reviewed the patient's current medications and allergies, medical/family/and social histories as well as the patient's vital signs as available.   PHYSICAL EXAMINATION    Vitals:   05/12/24 1513  BP: 126/79  Pulse: 88  Temp: 98.7 F (37.1 C)  SpO2: 96%      General: Well-developed, well-nourished.  No recognizable syndromes or patterns of malformation.  No acute distress.  Awake, alert, coherent, spontaneous and logical.  Speech: Speech is within normal limits.  Communicates without difficulty.  Head: Normocephalic without lesions or trauma  Face: Facial function is intact and symmetric.  Eyes: PERRLA with EOMI.  Ears: Otomicroscopic examination performed with the use of the microscope  Right Auricle: Normal location, size, shape, and angulation and No skin lesions or signs of inflammation or infection  Left Auricle:       Normal location, size, shape, and angulation and No skin lesions or signs of inflammation or infection  Right Mastoid Cavity:   Left Mastoid Cavity:   R External Auditory Canal: Normal canal width  and length., No signs of infection., No stenosis., and No masses.  Small amount of cerumen was cleared from the right lateral ear canal.  L External Auditory Canal: Normal canal width and length., No signs of infection., No stenosis., and No masses.  R Tympanic Membrane: Patient has a patch of myringosclerosis posterior inferiorly.  His TM was otherwise intact.  There is some crusting along the anterior inferior sulcus.  L Tympanic Membrane: The tympanic membrane was clear and mobile., There was an air containing middle ear space., No evidence of infection., and No evidence of cholesteatoma.  T[place] Testing:   Nose: Normal external nasal examination.  Oral Cavity:   Nasopharynx:   Larynx:   Neck:   Chest:   Cardiac:   Neurologic: Oriented x3 , cranial nerves II-XII were intact except for VIIIth nerve(s)  Other:     PROCEDURES     AUDIOMETRIC TESTING   05/28/2023 Audiogram:     08/04/2023 audiogram - My independent interpretation  My independent interpretation of the audiogram is the patient has asymmetric sensorineural hearing loss, right ear greater than left ear.  The right ear has a moderate to moderately severe downsloping sensorineural hearing loss, SRT of 85 dB and 24% word recognition.  The left ear has an SRT of 30 dB with 96% word recognition at 60 DB HTL.      10/22/2023  Audiogram:  My independent interpretation of the audiogram is that the patient has asymmetric sensorineural hearing loss.  The left ear has a mild low and mid frequency and moderate high-frequency sensorineural hearing loss, SRT of 30 dB and 100% word recognition.  His right ear has a moderate downsloping sensorineural hearing loss SRT of 60 dB and 72% word recognition at 90 DB HTL.  His word recognition has improved from 24% to 72% on prednisone  5 mg every other day.  He should now be able to wear hearing aids in both ears.      AUDIOLOGY CONSULTS      COCHLEAR IMPLANT CONSULTS & MAPPING       VESTIBULAR TESTING      SPEECH THERAPY/VOICE CONSULTS      DIAGNOSTIC STUDIES/CT/MRI/LAB/PATHOLOGY    12/13/2021 MRI brain scan report:   Normal MRI of the brain and 8th nerves.  Only the report is available; images not available    06/17/2023 MRI IACs and posterior fossa with and without contrast ([facility]) - images not available  IMPRESSION:  --Normal MRI of the brain.  --Normal MRI of the 7th and 8th nerves and internal auditory canals.  --Minimal fluid in some of the right mastoid air cells.     06/17/2023 MRI head without contrast ([facility]) -  images not available  FINDINGS:  --Skull/marrow/soft tissues: Unremarkable.  --Orbits: Unremarkable.  --Sinuses: Mild bilateral ethmoid sinus disease. No air-fluid levels. Minimal right mastoid effusion.  --Brain: No evidence of acute abnormality.  No significant white matter disease or acute ischemia.  No mass effect, hemorrhage, or hydrocephalus.  Grossly normal flow-related signal in the major intracranial arteries and dural sinuses.    07/28/23 CT sinuses without contrast  IMPRESSION:   Multifocal paranasal sinus mucosal thickening with obstruction of the bilateral frontal sinus drainage pathways.  Anatomic considerations: Slight rightward nasal septal deviation. The sphenoid intersinus septum terminates along the right optic canal. Thinning/dehiscent bone between the sphenoid sinus and left optic canal (3/146). Bilateral type II olfactory fossa.       IMPRESSION    48 y.o. male, with chronic right active cochlear and vestibular Mnire's disease and possibly developing some left Mnire's symptoms.  He also has a history that is compatible with some migraine disease.  It sounds as if the etiology of the right Mnire's disease may have followed a Lyme disease infection.  He was recently started on nortriptyline for his migraine condition.  Thyroxine and testosterone  have been discontinued.  He also has celiac  disease and has been having significant GI problems.  He could not tolerate methotrexate.  He has restarted prednisone  5 mg every other day.  On physical examination, his right tympanic membrane was atrophic with some myringosclerotic deposits but an air-containing right middle ear space.  Left tympanic membrane was clear and mobile.  The remainder of his head neck examination was unremarkable except for hearing loss in both ears, right greater than left.  On previous audiometric testing, the patient has asymmetric sensorineural hearing loss.  The left ear has a mild low and mid frequency and moderate high-frequency sensorineural hearing loss, SRT of 30 dB and 100% word recognition.  His right ear has a moderate downsloping sensorineural hearing loss SRT of 60 dB and 72% word recognition at 90 DB HTL.  His word recognition has improved from 24% to 72% on prednisone  5 mg every other day.  He should now be able to wear hearing aids in both ears.  On MRI brain scan and scanning of his IACs, he was not found to have any retrocochlear lesions.  There is no evidence of mass effect, hemorrhage, or hydrocephalus.  He had thinning or dehiscence of bone between the sphenoid sinus and the left optic canal and bilateral type II olfactory fossa anatomy.  Only MRI reports were available, not the actual images.  The patient is complex.  He has tried and failed multiple diuretics.  He does watch the sodium in his diet.  I explained to him that I would like him to be on a less than 2000 mg sodium diet per day.  He is somewhat improved on prednisone  5 mg every other day and has restarted the medication.  He has been evaluated by rheumatology. He was started on methotrexate 15mg /week + folic acid  on 02/04/2024 after conferring with his dermatologist. When seen on 03/08/24, he remained symptomatic.  Methotrexate takes about 35-months to reach full efficacy. They recommended tapering his steroids. He was advised to split the  Methotrexate dose - 3 tabs in am and 3 tabs in pm.  Abstain from alcohol while on methotrexate.  He did not tolerate the medication and stopped the methotrexate.  I did not recommend an intratympanic dexamethasone  injection today as his word recognition ability has improved from 24% to 72%.  I  did recommend that he consider a hearing aid trial.  Arnaldo will check his existing hearing aid and see if it is able to be reprogrammed.  He is not a cochlear implant candidate at this time.  He has an appointment to see his gastroenterologist next Tuesday as his GI tract feels raw and he has had some blood in his stool.      PLAN   --Continue prednisone  5 mg every other day. --He has stopped methotrexate due to GI side effects. --Continue Phenergan  suppositories 25 mg 1 PR every 6 hours as needed nausea.  I renewed his prescription. --Continue on hydroxyzine , beta histamine  and begin tapering the lorazepam  --Continue on a low-sodium diet of less than 2000 mg/day --Continue Zofran  in the early stages of nausea then switch to Phenergan  as above --Continue on nortriptyline for treatment of his migraines --Arnaldo will see if his current right hearing aid is reprogrammable. --Return to see me in 68-months for follow-up   DIAGNOSES   1. Active Meniere disease of right ear  Ambulatory referral to Audiology    2. Sensorineural hearing loss (SNHL) of right ear with restricted hearing of left ear  Ambulatory referral to Audiology    3. SNHL (sensory-neural hearing loss), asymmetrical  Ambulatory referral to Audiology    4. Tinnitus, right  Ambulatory referral to Audiology            MEDICAL DECISION MAKING     TIME DOCUMENTATION    Documentation of time spent today with the patient and any additional time spent today on the patient's behalf:  Category Time (minutes)  Time in room:   Time out of room:   Total time with patient:   Additional time (audio, scans, tests, etc.):    Total time spent today:      ATTESTATION      VOICE-TO-TEXT DISCLAIMER   The contents of this note were partially generated with the assistance of the voice-to-text dictation system, Dragon Medical One. I attest to the above information and documentation. However, this note has been created using voice recognition software and may have errors that were not dictated and were not seen during editing.   Camellia EMERSON Milliner, M.D., M.S., F.A.C.S. Assistant Professor, Otology/Neurotology Department of Otolaryngology-Head & Neck Surgery [facility]  [place], [place] [address] [place] Phone: [phone] Email: [email or web address]

## 2024-05-17 ENCOUNTER — Encounter: Payer: Self-pay | Admitting: Gastroenterology

## 2024-05-17 ENCOUNTER — Other Ambulatory Visit

## 2024-05-17 ENCOUNTER — Ambulatory Visit (INDEPENDENT_AMBULATORY_CARE_PROVIDER_SITE_OTHER): Admitting: Gastroenterology

## 2024-05-17 VITALS — BP 130/84 | HR 80 | Ht 68.0 in | Wt 208.0 lb

## 2024-05-17 DIAGNOSIS — R109 Unspecified abdominal pain: Secondary | ICD-10-CM

## 2024-05-17 DIAGNOSIS — R112 Nausea with vomiting, unspecified: Secondary | ICD-10-CM | POA: Diagnosis not present

## 2024-05-17 DIAGNOSIS — R197 Diarrhea, unspecified: Secondary | ICD-10-CM

## 2024-05-17 DIAGNOSIS — K900 Celiac disease: Secondary | ICD-10-CM | POA: Diagnosis not present

## 2024-05-17 MED ORDER — HYOSCYAMINE SULFATE 0.125 MG SL SUBL: 0.1250 mg | SUBLINGUAL_TABLET | SUBLINGUAL | 0 refills | Status: DC | PRN

## 2024-05-17 NOTE — Patient Instructions (Addendum)
 Celiac disease Continue gluten free diet  Abdominal cramping  Hyoscyamine  prn abdominal cramping  Diarrhea Can trial cholestyramine  when available  Nausea Antiemetics prn   Your provider has requested that you go to the basement level for lab work before leaving today. Press B on the elevator. The lab is located at the first door on the left as you exit the elevator.  _______________________________________________________  If your blood pressure at your visit was 140/90 or greater, please contact your primary care physician to follow up on this.  _______________________________________________________  If you are age 24 or older, your body mass index should be between 23-30. Your Body mass index is 31.63 kg/m. If this is out of the aforementioned range listed, please consider follow up with your Primary Care Provider.  If you are age 24 or younger, your body mass index should be between 19-25. Your Body mass index is 31.63 kg/m. If this is out of the aformentioned range listed, please consider follow up with your Primary Care Provider.   ________________________________________________________  The [facility] providers would like to encourage you to use MYCHART to communicate with providers for non-urgent requests or questions.  Due to long hold times on the telephone, sending your provider a message by [facility] may be a faster and more efficient way to get a response.  Please allow 48 business hours for a response.  Please remember that this is for non-urgent requests.  _______________________________________________________  [facility] is using a team-based approach to care.  Your team is made up of your doctor and two to three APPS. Our APPS (Nurse Practitioners and Physician Assistants) work with your physician to ensure care continuity for you. They are fully qualified to address your health concerns and develop a treatment plan. They communicate directly with your  gastroenterologist to care for you. Seeing the Advanced Practice Practitioners on your physician's team can help you by facilitating care more promptly, often allowing for earlier appointments, access to diagnostic testing, procedures, and other specialty referrals.   Thank you for trusting me with your gastrointestinal care. Deanna May, FNP-C

## 2024-05-17 NOTE — Progress Notes (Signed)
 Chief Complaint: diarrhea, nausea and vomiting Primary GI Doctor:Dr. San  HPI:  Patient is a  48  year old male patient with past medical history of Menieres disease, celiac disease, hyperlipidemia, and anxiety who was self referred to me for a evaluation of diarrhea .    Interval History    Patient presents for evaluation of acute diarrhea with nausea and vomiting.  Patient states his symptoms started about 4 months ago.  He reports that at about the same time he started methotrexate for Mnire's disease.  Patient discontinued the medication for about 3 weeks now.  Patient originally tried colostrum and over-the-counter probiotics without much improvement.  He reports today was the first semiformed stool he has had.  Patient reports he is having up to 5 loose stools per day with intermittent bright red blood with wiping from hemorrhoids.  Mild abdominal discomfort.  Reports irritation to the rectum due to frequent bowel movements.  Patient states the nausea and vomiting has improved the last few days but he does use promethazine  as needed.  Patient reports he has used Imodium in the past for diarrhea however causes severe cramping.  Patient does note that he stays in a house that is shared by another individual and that they had a family member that was a young kid that was diagnosed with parasitic infection.  He reports he does have a separate restroom but unsure if it was used when he was not home.  Patient prescribed cholestyramine  by PCP however his pharmacy did not have it therefore he is waiting for it to be shipped in and will pick up.  Patient also has history of celiac disease that was diagnosed back in 2012 and states he follows a very strict gluten-free diet.   GI procedures: 05/08/2023 colonoscopy, recall 3 years - Two 2 to 4 mm polyps in the cecum, removed with a cold snare. Resected and retrieved. - One 3 mm polyp in the rectum, removed with a cold snare. Resected and  retrieved. - Diverticulosis in the sigmoid colon. - Non- bleeding internal hemorrhoids. - The examined portion of the ileum was normal. Path: FINAL DIAGNOSIS        1. Surgical [P], colon, cecum, polyp (2) :       - TUBULAR ADENOMA (1).       - HYPERPLASTIC POLYP (1).       - NEGATIVE FOR HIGH-GRADE DYSPLASIA AND MALIGNANCY.        2. Surgical [P], colon, rectum, polyp (1) :       - HYPERPLASTIC POLYP.       - NEGATIVE FOR DYSPLASIA AND MALIGNANCY.    03/23/20 colonoscopy  - One 2 mm polyp in the cecum, removed with a cold biopsy forceps. Resected and retrieved. - Two 4 to 5 mm polyps in the descending colon and in the ascending colon, removed with a cold snare. Resected and retrieved. - One 8 mm polyp in the transverse colon, removed with a cold snare. Resected and retrieved. - Diverticulosis in the sigmoid colon. - Non- bleeding internal hemorrhoids. - The examined portion of the ileum was normal.  03/23/20 EGD - Normal esophagus. - Z- line regular, 40 cm from the incisors. - Gastritis. Biopsied.- Normal duodenal bulb, first portion of the duodenum and second portion of the duodenum. Biopsied. Path: Diagnosis 1. Surgical [P], duodenal - DUODENAL MUCOSA WITH NO SIGNIFICANT PATHOLOGIC FINDINGS. - NEGATIVE FOR INCREASED INTRAEPITHELIAL LYMPHOCYTES AND VILLOUS ARCHITECTURAL CHANGES. 2. Surgical [P], gastric - GASTRIC  ANTRAL MUCOSA WITH MILD REACTIVE GASTROPATHY. - GASTRIC OXYNTIC MUCOSA WITH MILD CHRONIC GASTRITIS. - WARTHIN-STARRY STAIN IS NEGATIVE FOR HELICOBACTER PYLORI. 3. Surgical [P], colon, cecal, polyp - SESSILE SERRATED POLYP WITHOUT CYTOLOGIC DYSPLASIA. 4. Surgical [P], colon, descending, polyp (2) - TUBULAR ADENOMA WITHOUT HIGH GRADE DYSPLASIA (X1). - HYPERPLASTIC POLYP (X1). 5. Surgical [P], colon, transverse, polyp - SESSILE SERRATED POLYP WITHOUT CYTOLOGIC DYSPLASIA.   Wt Readings from Last 3 Encounters:  05/17/24 208 lb (94.3 kg)  05/10/24 210 lb (95.3 kg)  04/28/24  211 lb 12 oz (96 kg)     Past Medical History:  Diagnosis Date   Anxiety    Arthritis    Celiac disease    Hand fracture    Headache    Hyperlipidemia    Lyme disease    Meniere disease    Migraines    Normal cardiac stress test 11/2008   at [facility] - negative.  terminated for fatigue   Thyroid  disease     Past Surgical History:  Procedure Laterality Date   COLONOSCOPY  05/08/2023   Vito Cirigliano   COLONOSCOPY W/ POLYPECTOMY  2021   TA SS   surgery on right hand Bilateral     Current Outpatient Medications  Medication Sig Dispense Refill   hyoscyamine  (LEVSIN  SL) 0.125 MG SL tablet Place 1 tablet (0.125 mg total) under the tongue every 4 (four) hours as needed. 30 tablet 0   Ascorbic Acid (VITAMIN C WITH ROSE HIPS) 500 MG tablet Take 500 mg by mouth in the morning and at bedtime.     busPIRone  (BUSPAR ) 5 MG tablet Take 1 tablet (5 mg total) by mouth 3 (three) times daily. 270 tablet 2   butalbital-acetaminophen -caffeine (FIORICET) 50-325-40 MG tablet Take 1-2 tablets by mouth daily as needed.     cholestyramine  light 4 g POWD Take 1 packet (4 g total) by mouth 2 (two) times daily. 231 g 0   Digestive Enzymes (SUPER ENZYMES PO) Take by mouth.     folic acid  (FOLVITE ) 1 MG tablet Take 1 mg by mouth daily.     hydrOXYzine  (VISTARIL ) 25 MG capsule Take 1 capsule (25 mg total) by mouth every 8 (eight) hours as needed. 90 capsule 3   LORazepam  (ATIVAN ) 1 MG tablet Take 1 tablet (1 mg total) by mouth 2 (two) times daily as needed for anxiety. 60 tablet 3   MAGNESIUM MALATE PO Take by mouth.     Omega-3 Fatty Acids (EPA PO) Take by mouth.     ondansetron  (ZOFRAN -ODT) 8 MG disintegrating tablet Take 1 tablet (8 mg total) by mouth 3 (three) times daily. 20 tablet 11   tadalafil (CIALIS) 5 MG tablet Take 5 mg by mouth daily as needed for erectile dysfunction.     testosterone  cypionate (DEPOTESTOSTERONE CYPIONATE) 200 MG/ML injection Inject 1 mL (200 mg total) into the muscle every 14  (fourteen) days. 6 mL 3   No current facility-administered medications for this visit.    Allergies as of 05/17/2024 - Review Complete 05/17/2024  Allergen Reaction Noted   Sulfa antibiotics Other (See Comments) 12/11/2009   Egg white (diagnostic)  12/28/2020   Gluten meal  03/23/2020   Naproxen sodium Swelling 07/15/2010   Peanut-containing drug products  12/28/2020   Salmon [fish oil]  12/28/2020   Barley grass Dermatitis, Diarrhea, Nausea And Vomiting, and Rash 12/28/2020   Corn-containing products Dermatitis, Diarrhea, Nausea And Vomiting, and Rash 12/28/2020   Crab (diagnostic) Dermatitis, Diarrhea, Nausea And Vomiting, and Rash 12/28/2020  Oat grain (diagnostic) Dermatitis, Diarrhea, Nausea And Vomiting, and Rash 12/28/2020   Shellfish allergy  Dermatitis, Diarrhea, Nausea And Vomiting, and Rash 12/28/2020   Shrimp (diagnostic) Dermatitis, Diarrhea, Itching, and Rash 12/28/2020    Family History  Problem Relation Age of Onset   Colon cancer Mother    Colon polyps Mother    Breast cancer Mother    Hyperlipidemia Mother    Hypertension Father    Hyperlipidemia Father    Other Other        grandfather with alcoholism   Heart attack Other        grandmother   Diabetes Other        grandmother   Colon cancer Other        great uncles on Dads side   Breast cancer Other        aunt   Lung cancer Other        grandmother   Prostate cancer Other        grandfather   Melanoma Other        uncle   Esophageal cancer Neg Hx    Rectal cancer Neg Hx    Stomach cancer Neg Hx     Review of Systems:    Constitutional: No weight loss, fever, chills, weakness or fatigue HEENT: Eyes: No change in vision               Ears, Nose, Throat:  No change in hearing or congestion Skin: No rash or itching Cardiovascular: No chest pain, chest pressure or palpitations   Respiratory: No SOB or cough Gastrointestinal: See HPI and otherwise negative Genitourinary: No dysuria or change in  urinary frequency Neurological: No headache, dizziness or syncope Musculoskeletal: No new muscle or joint pain Hematologic: No bleeding or bruising Psychiatric: No history of depression or anxiety    Physical Exam:  Vital signs: BP 130/84   Pulse 80   Ht 5' 8 (1.727 m)   Wt 208 lb (94.3 kg)   BMI 31.63 kg/m   Constitutional:   Pleasant male appears to be in NAD, Well developed, Well nourished, alert and cooperative Throat: Oral cavity and pharynx without inflammation, swelling or lesion.  Respiratory: Respirations even and unlabored. Lungs clear to auscultation bilaterally.   No wheezes, crackles, or rhonchi.  Cardiovascular: Normal S1, S2. Regular rate and rhythm. No peripheral edema, cyanosis or pallor.  Gastrointestinal:  Soft, nondistended, nontender. No rebound or guarding. Normal bowel sounds. No appreciable masses or hepatomegaly. Rectal:  Not performed.  Msk:  Symmetrical without gross deformities. Without edema, no deformity or joint abnormality.  Neurologic:  Alert and  oriented x4;  grossly normal neurologically.  Skin:   Dry and intact without significant lesions or rashes.  RELEVANT LABS AND IMAGING: CBC    Latest Ref Rng & Units 04/28/2024    2:04 PM 12/24/2023   10:59 AM 04/20/2023   11:31 AM  CBC  WBC 3.4 - 10.8 x10E3/uL 6.8  7.7  7.7   Hemoglobin 13.0 - 17.7 g/dL 82.5  83.0  83.2   Hematocrit 37.5 - 51.0 % 53.0  50.8  50.2   Platelets 150 - 450 x10E3/uL 276  304  281      CMP     Latest Ref Rng & Units 04/28/2024    2:04 PM 12/24/2023   10:59 AM 04/20/2023   11:31 AM  CMP  Glucose 70 - 99 mg/dL 84  94  84   BUN 6 - 24 mg/dL 12  8  9   Creatinine 0.76 - 1.27 mg/dL 8.89  9.14  8.94   Sodium 134 - 144 mmol/L 134  139  139   Potassium 3.5 - 5.2 mmol/L 4.1  4.4  4.7   Chloride 96 - 106 mmol/L 100  101  102   CO2 20 - 29 mmol/L 21  24  22    Calcium  8.7 - 10.2 mg/dL 9.4  9.9  9.4   Total Protein 6.0 - 8.5 g/dL  7.3  7.4   Total Bilirubin 0.0 - 1.2 mg/dL   0.3  0.4   Alkaline Phos 44 - 121 IU/L  55  60   AST 0 - 40 IU/L  26  24   ALT 0 - 44 IU/L  38  31      Lab Results  Component Value Date   TSH 2.150 12/24/2023     Assessment: Encounter Diagnoses  Name Primary?   Abdominal cramping Yes   Diarrhea, unspecified type    Celiac disease    Nausea and vomiting, unspecified vomiting type   48 year old male patient who presents with acute episode of diarrhea with nausea and vomiting.  Patient was recently started on methotrexate for Mnire's disease which has been discontinued for 3 weeks now.  Patient states today is the first semiformed stool he has had.  Patient was in contact with someone who Alexzandra Bilton have had enteric infection therefore we will go ahead and check GI stool profile along with C. difficile to rule out any enteric infection.  Patient can use hyoscyamine  as needed for abd discomfort.  Patient has pending prescription cholestyramine  prescribed by PCP which I told him he could try.  We discussed that if the workup is negative and his symptoms continue we Jayon Matton need to proceed with endoscopic procedures.  Patient verbalizes understanding and agrees to plan.  Plan: - GI profile with cdiff PCR -continue gluten free diet - Send Hyoscyamine  prn abd pain -pending cholestyramine  -antiemetics prn -will consider endoscopic procedures pending results -recall 05/2026  Thank you for the courtesy of this consult. Please call me with any questions or concerns.   Recie Cirrincione, FNP-C [facility] 05/17/2024, 4:19 PM  Cc: Crain, Whitney L, PA

## 2024-05-18 ENCOUNTER — Other Ambulatory Visit

## 2024-05-18 DIAGNOSIS — R109 Unspecified abdominal pain: Secondary | ICD-10-CM

## 2024-05-18 DIAGNOSIS — R112 Nausea with vomiting, unspecified: Secondary | ICD-10-CM

## 2024-05-18 DIAGNOSIS — K900 Celiac disease: Secondary | ICD-10-CM

## 2024-05-18 DIAGNOSIS — R197 Diarrhea, unspecified: Secondary | ICD-10-CM

## 2024-05-18 NOTE — Progress Notes (Signed)
 Agree with the assessment and plan as outlined by [facility], FNP-C. Can also add O&P panel if persistent sxs and possible recent exposure.   Taite Schoeppner, DO, [facility]

## 2024-05-20 ENCOUNTER — Ambulatory Visit: Payer: Self-pay | Admitting: Gastroenterology

## 2024-05-20 ENCOUNTER — Telehealth: Payer: Self-pay | Admitting: Gastroenterology

## 2024-05-20 LAB — GI PROFILE, STOOL, PCR

## 2024-05-20 NOTE — Telephone Encounter (Signed)
 Inbound call from [employer] in regards to a lab order done for this patient. [employer] stated that they received a Alert that this pt was detected to have Asteroid Virus. A good call back number for them is [phone] option 1. please advise.

## 2024-05-20 NOTE — Telephone Encounter (Signed)
 This has already been taken care of in separate phone note from today. NP is aware & has notified patient.

## 2024-05-23 ENCOUNTER — Encounter: Payer: Self-pay | Admitting: Urgent Care

## 2024-05-24 ENCOUNTER — Other Ambulatory Visit: Payer: Self-pay | Admitting: Urgent Care

## 2024-05-24 DIAGNOSIS — F411 Generalized anxiety disorder: Secondary | ICD-10-CM

## 2024-06-10 ENCOUNTER — Other Ambulatory Visit: Payer: Self-pay

## 2024-06-10 DIAGNOSIS — R197 Diarrhea, unspecified: Secondary | ICD-10-CM

## 2024-06-10 MED ORDER — CHOLESTYRAMINE LIGHT 4 G PO POWD: 4.0000 g | Freq: Two times a day (BID) | ORAL | 0 refills | Status: DC

## 2024-06-22 ENCOUNTER — Other Ambulatory Visit: Payer: Self-pay | Admitting: Medical Genetics

## 2024-06-22 DIAGNOSIS — Z006 Encounter for examination for normal comparison and control in clinical research program: Secondary | ICD-10-CM

## 2024-06-27 ENCOUNTER — Ambulatory Visit: Admitting: Urgent Care

## 2024-07-01 ENCOUNTER — Ambulatory Visit (INDEPENDENT_AMBULATORY_CARE_PROVIDER_SITE_OTHER): Admitting: Urgent Care

## 2024-07-01 VITALS — BP 126/80 | HR 82 | Ht 68.0 in | Wt 209.0 lb

## 2024-07-01 DIAGNOSIS — M7701 Medial epicondylitis, right elbow: Secondary | ICD-10-CM

## 2024-07-01 DIAGNOSIS — E291 Testicular hypofunction: Secondary | ICD-10-CM | POA: Diagnosis not present

## 2024-07-01 DIAGNOSIS — H8101 Meniere's disease, right ear: Secondary | ICD-10-CM | POA: Diagnosis not present

## 2024-07-01 DIAGNOSIS — M7711 Lateral epicondylitis, right elbow: Secondary | ICD-10-CM | POA: Diagnosis not present

## 2024-07-01 DIAGNOSIS — T451X5D Adverse effect of antineoplastic and immunosuppressive drugs, subsequent encounter: Secondary | ICD-10-CM

## 2024-07-01 DIAGNOSIS — M069 Rheumatoid arthritis, unspecified: Secondary | ICD-10-CM | POA: Insufficient documentation

## 2024-07-01 MED ORDER — TESTOSTERONE CYPIONATE 200 MG/ML IM SOLN: 200.0000 mg | INTRAMUSCULAR | 3 refills | Status: AC

## 2024-07-01 NOTE — Progress Notes (Signed)
 Established Patient Office Visit  Subjective:  Patient ID: Brian Spears, male    DOB: [birth year]  Age: 48 y.o. MRN: [id number]  Chief Complaint  Patient presents with   Follow-up    HPI  Discussed the use of AI scribe software for clinical note transcription with the patient, who Spears verbal consent to proceed.  History of Present Illness   Brian Spears is a 48 year old male who presents for medication management and evaluation of histamine  responses to food.  He is currently on prednisone , taking 5 mg every other night, which has significantly alleviated his gastrointestinal issues. However, he notices a marked difference when he discontinues it. He experiences unusual histamine  responses to foods he previously tolerated, such as squash and broccoli, resulting in congestion-like symptoms. This issue had resolved years ago after dietary adjustments based on a panel identifying inflammatory and allergenic foods, but has since recurred. He has reverted to a diet of meat, rice, and select vegetables, avoiding problematic foods.  He consumes Brian than 900 calories a day but is unable to lose weight, possibly due to prednisone  use. He drinks almost three liters of water daily and has a ginger ale each evening. He is unable to exercise as desired due to joint flare-ups, which have improved since discontinuing certain supplements like turmeric and elderberry.  He is currently taking lorazepam  1 mg twice daily, hydroxyzine  once daily, and buspirone , which he has reduced from three times daily to once in the evening due to sleep disturbances and lethargy. He also uses promethazine  suppositories for nausea and vomiting as needed.  He has a history of rheumatoid arthritis and osteoarthritis, with significant size difference and reduced extension in his elbows. He experiences occasional pain and catching in his elbow, particularly during certain activities. This has been monitored for years.  He is  on testosterone  injections, preferring to inject with a 20-gauge needle due to difficulty with smaller needles. He has tried other forms of testosterone  but prefers injections despite issues with scar tissue from repeated injections in the same spot.      Patient Active Problem List   Diagnosis Date Noted   Rheumatoid arthritis involving multiple sites (HCC) 07/01/2024   Statin myopathy 05/11/2024   History of Lyme disease 03/25/2019   Chronic migraine 03/10/2019   Generalized anxiety manifesting as headache 01/18/2019   Lateral epicondylitis, right elbow 12/15/2018   Plantar fasciitis, left 03/05/2018   Right shoulder pain 07/02/2017   Meniere's disease 06/09/2016   Medial epicondylitis of right elbow 02/18/2016   Onychodystrophy 12/05/2014   Primary osteoarthritis of both elbows 12/05/2014   Elevated hemoglobin 08/01/2013   Male hypogonadism 06/01/2013   GERD (gastroesophageal reflux disease) 03/01/2013   History of smoking 06/07/2012   Annual physical exam 06/07/2012   Celiac disease 06/30/2011   Hyperlipidemia 09/27/2010   Bilateral carpal tunnel syndrome 09/27/2010   Past Medical History:  Diagnosis Date   Anxiety    Arthritis    Celiac disease    Hand fracture    Headache    Hyperlipidemia    Lyme disease    Meniere disease    Migraines    Normal cardiac stress test 11/2008   at [facility] - negative.  terminated for fatigue   Thyroid  disease    Past Surgical History:  Procedure Laterality Date   COLONOSCOPY  05/08/2023   Vito Cirigliano   COLONOSCOPY W/ POLYPECTOMY  2021   TA SS   surgery on right hand Bilateral  Social History   Tobacco Use   Smoking status: Former    Current packs/day: 0.20    Types: Cigarettes   Smokeless tobacco: Former    Quit date: 06/02/2011   Tobacco comments:    started smoking at age 37, started smokeless tobacco at age 51  Vaping Use   Vaping status: Some Days  Substance Use Topics   Alcohol use: No   Drug use: No      ROS:  as noted in HPI  Objective:     BP 126/80   Pulse 82   Ht 5' 8 (1.727 m)   Wt 209 lb (94.8 kg)   SpO2 96%   BMI 31.78 kg/m  BP Readings from Last 3 Encounters:  07/01/24 126/80  05/17/24 130/84  05/10/24 114/74   Wt Readings from Last 3 Encounters:  07/01/24 209 lb (94.8 kg)  05/17/24 208 lb (94.3 kg)  05/10/24 210 lb (95.3 kg)      Physical Exam Vitals and nursing note reviewed.  Constitutional:      General: He is not in acute distress.    Appearance: Normal appearance. He is not ill-appearing, toxic-appearing or diaphoretic.  HENT:     Head: Normocephalic and atraumatic.     Right Ear: External ear normal.     Left Ear: External ear normal.     Nose: Nose normal.  Eyes:     General: No scleral icterus.    Pupils: Pupils are equal, round, and reactive to light.  Cardiovascular:     Rate and Rhythm: Normal rate.  Pulmonary:     Effort: Pulmonary effort is normal. No respiratory distress.  Musculoskeletal:     Comments: Both lateral and medial epicondyles with visible enlargement on R Pt unable to fully extend R elbow  Skin:    General: Skin is warm and dry.     Findings: No erythema or rash.  Neurological:     General: No focal deficit present.     Mental Status: He is alert and oriented to person, place, and time.      No results found for any visits on 07/01/24.    The 10-year ASCVD risk score (Arnett DK, et al., 2019) is: 4.3%  Assessment & Plan:  Meniere's disease of right ear  Male hypogonadism -     Testosterone  Cypionate; Inject 1 mL (200 mg total) into the muscle every 14 (fourteen) days.  Dispense: 10 mL; Refill: 3  Lateral epicondylitis, right elbow  Medial epicondylitis of right elbow  Adverse effect of methotrexate, subsequent encounter  Assessment and Plan    Testosterone  deficiency Managed with injections. Prefers injections over pellet therapy. Previous oral testosterone  increased estrogen levels. - Call in a 10 mL multi-use  vial of testosterone  to [facility]. - Instruct to rotate injection sites to avoid scar tissue formation. - Schedule follow-up appointment in December to recheck testosterone  levels.  Medial and Lateral R epicondylitis Joint flare-ups and limited exercise tolerance. Improvement after discontinuing certain supplements. - Discussed possible referral to [facility] for orthopedic evaluation of elbow if needed.  Chronic corticosteroid therapy On prednisone  for GI symptoms. Histamine  responses to certain foods and difficulty losing weight possibly related to prednisone . - Continue prednisone  as prescribed by Dr. Sherill. - Monitor for changes in GI symptoms and weight.  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 Currently on a restricted diet. - Pt already referred to immunologist for evaluation of potential immunoglobulin deficiency and other immune-related issues. Appt early Nov -  Continue current dietary modifications to manage symptoms.  Chronic anxiety Managed with buspirone  and lorazepam . Increased lethargy and sleep disturbances with higher doses of buspirone . - Continue buspirone  5 mg once daily with plan to attempt discontinuation in one week. If alternative medication required post DC, notify me - Continue lorazepam  1 mg twice daily.         No follow-ups on file.   Brian LITTIE Gave, PA

## 2024-07-01 NOTE — Patient Instructions (Signed)
 I have called in a 10mL vial of testosterone . Please have immunology send office notes. We will see you in Dec

## 2024-07-04 ENCOUNTER — Encounter: Payer: Self-pay | Admitting: Urgent Care

## 2024-07-19 LAB — GENECONNECT MOLECULAR SCREEN: Genetic Analysis Overall Interpretation: NEGATIVE

## 2024-08-11 ENCOUNTER — Encounter: Payer: Self-pay | Admitting: Urgent Care

## 2024-08-11 ENCOUNTER — Ambulatory Visit: Admitting: Urgent Care

## 2024-08-11 VITALS — BP 115/78 | HR 79 | Ht 68.0 in | Wt 211.0 lb

## 2024-08-11 DIAGNOSIS — R948 Abnormal results of function studies of other organs and systems: Secondary | ICD-10-CM

## 2024-08-11 DIAGNOSIS — E291 Testicular hypofunction: Secondary | ICD-10-CM

## 2024-08-11 DIAGNOSIS — F411 Generalized anxiety disorder: Secondary | ICD-10-CM | POA: Diagnosis not present

## 2024-08-11 DIAGNOSIS — R7989 Other specified abnormal findings of blood chemistry: Secondary | ICD-10-CM

## 2024-08-11 DIAGNOSIS — H9311 Tinnitus, right ear: Secondary | ICD-10-CM | POA: Diagnosis not present

## 2024-08-11 DIAGNOSIS — Z1159 Encounter for screening for other viral diseases: Secondary | ICD-10-CM

## 2024-08-11 DIAGNOSIS — D582 Other hemoglobinopathies: Secondary | ICD-10-CM

## 2024-08-11 DIAGNOSIS — E7800 Pure hypercholesterolemia, unspecified: Secondary | ICD-10-CM

## 2024-08-11 DIAGNOSIS — M0579 Rheumatoid arthritis with rheumatoid factor of multiple sites without organ or systems involvement: Secondary | ICD-10-CM

## 2024-08-11 DIAGNOSIS — R5383 Other fatigue: Secondary | ICD-10-CM

## 2024-08-11 MED ORDER — LORAZEPAM 1 MG PO TABS: 1.0000 mg | ORAL_TABLET | Freq: Two times a day (BID) | ORAL | 3 refills | Status: AC | PRN

## 2024-08-11 NOTE — Patient Instructions (Signed)
 Please continue all medications as ordered. Follow up in 6 months, sooner as needed.

## 2024-08-11 NOTE — Progress Notes (Signed)
 Established Patient Office Visit  Subjective:  Patient ID: Brian Spears, male    DOB: [birth year]  Age: 48 y.o. MRN: [id number]  Chief Complaint  Patient presents with   Follow-up    On a slow taper off prednisone     HPI  Discussed the use of AI scribe software for clinical note transcription with the patient, who gave verbal consent to proceed.  History of Present Illness   Brian Spears is a 48 year old male with Meniere's disease who presents for medication management and follow-up.  His Meniere's disease symptoms have improved, potentially due to dietary changes. He has eliminated processed foods, rice, and corn from his diet, focusing on meat and vegetables, and rarely adds salt to his food. He consumes Pedialyte for sodium intake and has not experienced significant symptoms recently.  He discontinued cholestyramine  due to adverse gastrointestinal effects, including severe constipation followed by diarrhea. He has increased his intake of probiotics and ginger tea to manage gastrointestinal irritation, likely residual from chemotherapy. He drinks two to three liters of water daily and has stopped drinking coffee, opting for black tea in the mornings and green or ginger tea during the day.  He attempted to return to the gym and is seeking a workout partner for accountability and safety. He has a treadmill without handles, which he uses cautiously. He notices increased heel striking when his ear symptoms worsen, indicating a need to rest.  He has stopped taking Buspar  temporarily and plans to taper off lorazepam  after the holiday season. He continues to use azelastine nasal spray, which he finds effective despite its bitter taste. He uses triamcinolone  cream for skin issues following prednisone  tapering. He has discontinued venlafaxine due to adverse effects.  He is on testosterone  therapy. He has a history of smokeless tobacco use but denies cigarette smoking. He has rheumatoid  arthritis with a positive rheumatoid factor.  He underwent extensive allergy  testing, which confirmed known allergies, primarily to mold. He was mistakenly sent for allergy  testing instead of a sleep study. He has a history of high cholesterol, which he attributes to genetics rather than diet, and has not tolerated statins well in the past.  He is due for a testosterone  injection today and typically receives it every two weeks. He uses lorazepam , hydroxyzine , and testosterone  regularly. He has a history of anemia and elevated red blood cell count.      Patient Active Problem List   Diagnosis Date Noted   Rheumatoid arthritis involving multiple sites (HCC) 07/01/2024   Statin myopathy 05/11/2024   Sensorineural hearing loss (SNHL) of right ear with restricted hearing of left ear 10/22/2023   History of Lyme disease 03/25/2019   Chronic migraine 03/10/2019   Lateral epicondylitis, right elbow 12/15/2018   Plantar fasciitis, left 03/05/2018   Right shoulder pain 07/02/2017   Meniere's disease 06/09/2016   Medial epicondylitis of right elbow 02/18/2016   Onychodystrophy 12/05/2014   Primary osteoarthritis of both elbows 12/05/2014   Elevated hemoglobin 08/01/2013   Male hypogonadism 06/01/2013   GERD (gastroesophageal reflux disease) 03/01/2013   Annual physical exam 06/07/2012   Celiac disease 06/30/2011   Hyperlipidemia 09/27/2010   Bilateral carpal tunnel syndrome 09/27/2010   Past Medical History:  Diagnosis Date   Anxiety    Arthritis    Celiac disease    Hand fracture    Headache    Hyperlipidemia    Lyme disease    Meniere disease    Migraines    Normal  cardiac stress test 11/2008   at [facility] - negative.  terminated for fatigue   Thyroid  disease    Past Surgical History:  Procedure Laterality Date   COLONOSCOPY  05/08/2023   Vito Cirigliano   COLONOSCOPY W/ POLYPECTOMY  2021   TA SS   surgery on right hand Bilateral    Social History[1]    ROS: as noted in  HPI  Objective:     BP 115/78   Pulse 79   Ht 5' 8 (1.727 m)   Wt 211 lb (95.7 kg)   SpO2 96%   BMI 32.08 kg/m  BP Readings from Last 3 Encounters:  08/11/24 115/78  07/01/24 126/80  05/17/24 130/84   Wt Readings from Last 3 Encounters:  08/11/24 211 lb (95.7 kg)  07/01/24 209 lb (94.8 kg)  05/17/24 208 lb (94.3 kg)      Physical Exam Vitals and nursing note reviewed.  Constitutional:      General: He is not in acute distress.    Appearance: Normal appearance. He is not ill-appearing, toxic-appearing or diaphoretic.  HENT:     Head: Normocephalic and atraumatic.     Right Ear: External ear normal.     Left Ear: External ear normal.     Nose: Nose normal.     Mouth/Throat:     Mouth: Mucous membranes are moist.     Pharynx: Oropharynx is clear. No oropharyngeal exudate or posterior oropharyngeal erythema.  Eyes:     General: No scleral icterus.       Right eye: No discharge.        Left eye: No discharge.     Pupils: Pupils are equal, round, and reactive to light.  Cardiovascular:     Rate and Rhythm: Normal rate and regular rhythm.  Pulmonary:     Effort: Pulmonary effort is normal. No respiratory distress.     Breath sounds: Normal breath sounds. No stridor. No wheezing, rhonchi or rales.  Musculoskeletal:     Cervical back: Normal range of motion and neck supple. No rigidity or tenderness.  Lymphadenopathy:     Cervical: No cervical adenopathy.  Skin:    General: Skin is warm and dry.     Coloration: Skin is not jaundiced.     Findings: No bruising, erythema or rash.  Neurological:     General: No focal deficit present.     Mental Status: He is alert and oriented to person, place, and time.     Gait: Gait normal.         04/28/2024    2:08 PM 12/28/2023   10:54 AM 08/05/2022   11:10 AM 05/20/2021   10:36 AM 02/15/2021    9:14 AM  Depression screen PHQ 2/9  Decreased Interest 1 0 0 1 1  Down, Depressed, Hopeless 2 0 0 0 1  PHQ - 2 Score 3 0 0 1 2   Altered sleeping 2   2 3   Tired, decreased energy 0   2 1  Change in appetite 0    0  Feeling bad or failure about yourself  0   0 1  Trouble concentrating 1   0 0  Moving slowly or fidgety/restless 1   0 1  Suicidal thoughts 0   0 0  PHQ-9 Score 7    5  8    Difficult doing work/chores Not difficult at all   Somewhat difficult Somewhat difficult     Data saved with a previous flowsheet row definition  04/28/2024    2:09 PM 03/16/2024   11:50 AM 05/20/2021   10:36 AM 02/15/2021    9:15 AM  GAD 7 : Generalized Anxiety Score  Nervous, Anxious, on Edge 0 1 1 1   Control/stop worrying 0 1 0 1  Worry too much - different things 1 1 1 1   Trouble relaxing 1 3 1 1   Restless 0 0 0 1  Easily annoyed or irritable 0 0 0 0  Afraid - awful might happen 0 1 0 0  Total GAD 7 Score 2 7 3 5   Anxiety Difficulty  Somewhat difficult Somewhat difficult Somewhat difficult       The 10-year ASCVD risk score (Arnett DK, et al., 2019) is: 3.8%  Assessment & Plan:  Male hypogonadism -     Testosterone ,Free and Total -     PSA, total and free  Elevated hemoglobin -     CBC with Differential/Platelet -     Hemoglobin A1c  Other fatigue -     CMP14+EGFR -     CBC with Differential/Platelet -     TSH + free T4  Generalized anxiety disorder -     LORazepam ; Take 1 tablet (1 mg total) by mouth 2 (two) times daily as needed for anxiety.  Dispense: 60 tablet; Refill: 3  Pure hypercholesterolemia -     Lipid panel  Rheumatoid arthritis involving multiple sites with positive rheumatoid factor (HCC)  Need for hepatitis C screening test -     Hepatitis C antibody  Tinnitus, right -     LORazepam ; Take 1 tablet (1 mg total) by mouth 2 (two) times daily as needed for anxiety.  Dispense: 60 tablet; Refill: 3  Abnormal results of function studies of other organs and systems -     PSA, total and free  Other specified abnormal findings of blood chemistry -     Hemoglobin A1c  Assessment and  Plan    Meniere's disease Symptoms improved with dietary changes. Adheres to low-sodium diet and uses Pedialyte for sodium intake. Aware of symptom triggers. - Continue low-sodium diet. - Monitor symptoms and adjust activity as needed.  Rheumatoid arthritis with positive rheumatoid factor Improvement with resistance bands and effective use of topical gel for swelling. - Continue using resistance bands for exercise.  Male hypogonadism On testosterone  therapy. Due for injection today. Aware of regular monitoring needs. - Administered testosterone  injection today. - Checked testosterone  levels (total and free).  Pure hypercholesterolemia Previously experienced gastrointestinal side effects with statins. Unable to tolerate cholestyramine . - Will consider retrying statins when gastrointestinal symptoms improve.  Generalized anxiety disorder Taking lorazepam  and hydroxyzine . Plans to taper lorazepam  post-holidays. Stopped Buspar . Aware of need to monitor symptoms. - Refilled lorazepam  prescription.  General health maintenance Overdue for tetanus vaccine and hepatitis C screening. Aware of need for regular screenings and vaccinations. - Ordered hepatitis C screening. - Discussed tetanus vaccine update.         Return in about 6 months (around 02/09/2025).   Benton LITTIE Gave, PA     [1]  Social History Tobacco Use   Smoking status: Never   Smokeless tobacco: Former    Quit date: 06/02/2011   Tobacco comments:    started smokeless tobacco at age 48  Vaping Use   Vaping status: Some Days  Substance Use Topics   Alcohol use: No   Drug use: No

## 2024-08-12 ENCOUNTER — Encounter: Payer: Self-pay | Admitting: Urgent Care

## 2024-08-12 ENCOUNTER — Ambulatory Visit: Payer: Self-pay | Admitting: Urgent Care

## 2024-08-12 LAB — LIPID PANEL
Chol/HDL Ratio: 5.5 ratio — ABNORMAL HIGH (ref 0.0–5.0)
Cholesterol, Total: 225 mg/dL — ABNORMAL HIGH (ref 100–199)
HDL: 41 mg/dL (ref >39)
LDL Chol Calc (NIH): 140 mg/dL — ABNORMAL HIGH (ref 0–99)
Triglycerides: 242 mg/dL — ABNORMAL HIGH (ref 0–149)
VLDL Cholesterol Cal: 44 mg/dL — ABNORMAL HIGH (ref 5–40)

## 2024-08-12 LAB — CMP14+EGFR
ALT: 32 IU/L (ref 0–44)
AST: 24 IU/L (ref 0–40)
Albumin: 4.5 g/dL (ref 4.1–5.1)
Alkaline Phosphatase: 65 IU/L (ref 47–123)
BUN/Creatinine Ratio: 12 (ref 9–20)
BUN: 10 mg/dL (ref 6–24)
Bilirubin Total: 0.6 mg/dL (ref 0.0–1.2)
CO2: 20 mmol/L (ref 20–29)
Calcium: 9.4 mg/dL (ref 8.7–10.2)
Chloride: 101 mmol/L (ref 96–106)
Creatinine, Ser: 0.81 mg/dL (ref 0.76–1.27)
Globulin, Total: 3 g/dL (ref 1.5–4.5)
Glucose: 79 mg/dL (ref 70–99)
Potassium: 4.1 mmol/L (ref 3.5–5.2)
Sodium: 138 mmol/L (ref 134–144)
Total Protein: 7.5 g/dL (ref 6.0–8.5)
eGFR: 108 mL/min/1.73 (ref >59)

## 2024-08-12 LAB — CBC WITH DIFFERENTIAL/PLATELET
Basophils Absolute: 0.1 x10E3/uL (ref 0.0–0.2)
Basos: 1 %
EOS (ABSOLUTE): 0.5 x10E3/uL — ABNORMAL HIGH (ref 0.0–0.4)
Eos: 7 %
Hematocrit: 56.5 % — ABNORMAL HIGH (ref 37.5–51.0)
Hemoglobin: 18.6 g/dL — ABNORMAL HIGH (ref 13.0–17.7)
Immature Grans (Abs): 0 x10E3/uL (ref 0.0–0.1)
Immature Granulocytes: 0 %
Lymphocytes Absolute: 2.7 x10E3/uL (ref 0.7–3.1)
Lymphs: 39 %
MCH: 28.8 pg (ref 26.6–33.0)
MCHC: 32.9 g/dL (ref 31.5–35.7)
MCV: 88 fL (ref 79–97)
Monocytes Absolute: 0.8 x10E3/uL (ref 0.1–0.9)
Monocytes: 11 %
Neutrophils Absolute: 2.9 x10E3/uL (ref 1.4–7.0)
Neutrophils: 42 %
Platelets: 276 x10E3/uL (ref 150–450)
RBC: 6.45 x10E6/uL — ABNORMAL HIGH (ref 4.14–5.80)
RDW: 12.4 % (ref 11.6–15.4)
WBC: 6.9 x10E3/uL (ref 3.4–10.8)

## 2024-08-12 LAB — HEMOGLOBIN A1C
Est. average glucose Bld gHb Est-mCnc: 114 mg/dL
Hgb A1c MFr Bld: 5.6 % (ref 4.8–5.6)

## 2024-08-12 LAB — PSA, TOTAL AND FREE
PSA, Free Pct: 48.3 %
PSA, Free: 0.29 ng/mL
Prostate Specific Ag, Serum: 0.6 ng/mL (ref 0.0–4.0)

## 2024-08-12 LAB — TESTOSTERONE,FREE AND TOTAL
Testosterone, Free: 3.2 pg/mL — ABNORMAL LOW (ref 6.8–21.5)
Testosterone: 273 ng/dL (ref 264–916)

## 2024-08-12 LAB — TSH+FREE T4
Free T4: 1.24 ng/dL (ref 0.82–1.77)
TSH: 4.61 u[IU]/mL — ABNORMAL HIGH (ref 0.450–4.500)

## 2024-08-12 LAB — HEPATITIS C ANTIBODY: Hep C Virus Ab: NONREACTIVE

## 2024-08-13 ENCOUNTER — Encounter: Payer: Self-pay | Admitting: Urgent Care

## 2024-08-16 MED ORDER — ZEPBOUND 2.5 MG/0.5ML ~~LOC~~ SOAJ: 2.5000 mg | SUBCUTANEOUS | 0 refills | Status: DC

## 2024-08-17 ENCOUNTER — Other Ambulatory Visit (HOSPITAL_COMMUNITY): Payer: Self-pay

## 2024-08-18 ENCOUNTER — Other Ambulatory Visit (HOSPITAL_COMMUNITY): Payer: Self-pay

## 2024-08-18 ENCOUNTER — Telehealth: Payer: Self-pay

## 2024-08-18 NOTE — Telephone Encounter (Signed)
 Pharmacy Patient Advocate Encounter   Received notification from Patient Advice Request messages that prior authorization for Zepbound  2.5mg /0.60ml is required/requested.   Insurance verification completed.   The patient is insured through [facility] MEDICAID.   Per test claim: Effective October 1st, Medicaid discontinued coverage of GLP1 medications for weight loss (such as Wegovy and Zepbound ), unless the patient has a documented history of a heart attack or stroke. Zepbound  will continue to be covered only for patients with moderate to severe sleep apnea (AHI 15-30) and a BMI greater than 40. Because of this change, the prior authorization team will not be submitting new PA requests for GLP1 medications prescribed for weight loss, as patients will be unable to continue therapy under Medicaid coverage.

## 2024-08-19 ENCOUNTER — Other Ambulatory Visit (HOSPITAL_COMMUNITY): Payer: Self-pay

## 2024-08-29 ENCOUNTER — Ambulatory Visit: Admitting: Urgent Care

## 2024-09-05 ENCOUNTER — Ambulatory Visit (INDEPENDENT_AMBULATORY_CARE_PROVIDER_SITE_OTHER): Admitting: Urgent Care

## 2024-09-05 VITALS — BP 115/76 | HR 83 | Ht 68.0 in | Wt 212.0 lb

## 2024-09-05 DIAGNOSIS — R5383 Other fatigue: Secondary | ICD-10-CM | POA: Diagnosis not present

## 2024-09-05 DIAGNOSIS — D582 Other hemoglobinopathies: Secondary | ICD-10-CM

## 2024-09-05 DIAGNOSIS — R946 Abnormal results of thyroid function studies: Secondary | ICD-10-CM | POA: Diagnosis not present

## 2024-09-05 DIAGNOSIS — H8101 Meniere's disease, right ear: Secondary | ICD-10-CM | POA: Diagnosis not present

## 2024-09-05 DIAGNOSIS — Z6832 Body mass index (BMI) 32.0-32.9, adult: Secondary | ICD-10-CM

## 2024-09-05 NOTE — Progress Notes (Signed)
 "  Established Patient Office Visit  Subjective:  Patient ID: Brian Spears, male    DOB: 17-Sep-1975  Age: 49 y.o. MRN: [id number]  Chief Complaint  Patient presents with   Follow-up    Meniere flare    HPI  Discussed the use of AI scribe software for clinical note transcription with the patient, who gave verbal consent to proceed.  History of Present Illness   Brian Spears is a 49 year old male with Meniere's disease who presents with a prolonged Meniere's attack.  He is experiencing the longest Meniere's attack he has had, starting last Wednesday and still ongoing, though less severe. Symptoms are bilateral and include dizziness, hearing loss, and tinnitus, all equally bothersome. Noises seem to switch ears and sometimes disappear, which is frustrating. He manages vertigo by sitting down and performing physical therapy exercises, which take a couple of hours to alleviate symptoms.  His Meniere's attacks often follow high-stress days and are triggered by 'unnatural anxiety' and 'adrenaline dumps.' During these episodes, he feels weak, nauseous, and experiences prolonged spinning sensations, which he manages by lying down and closing his eyes until they pass.  His current medication regimen includes Ativan  taken once in the morning and once in the evening. He has previously tried Valium , which resulted in severe adverse effects, including waking up feeling suicidal. He also uses hydroxyzine  for severe symptoms, which significantly reduces symptoms but also decreases his functionality.  He has been tapering off prednisone , reducing from five to four to three, and plans to reduce to two by the end of the week. He has adjusted his diet by removing morning teas, increasing water intake, consuming Pedialyte, and reducing salt intake. He avoids caffeine except for black tea in the morning and does not consume coffee or sodas.  He has a history of seeing multiple ENTs and surgeons, none of whom  have considered him a candidate for surgical interventions or gentamicin  injections. He was not confirmed to have Meniere's disease until about three years ago, despite symptoms starting in 2006. He reports significant hearing loss in one ear and has an upcoming audiologist appointment to assess the other ear.  He feels constantly dehydrated despite consuming three liters of water daily, along with Pedialyte and ginger ale. He donated blood about two to three weeks ago, after which he has felt worse, experiencing difficulty staying hydrated.  He mentions a history of thyroid  medication use, but currently does not take any. He has experienced weight gain over the past three years, despite consuming less than 900 calories a day. He endorses chronic fatigue and would like this further evaluated.      Patient Active Problem List   Diagnosis Date Noted   Statin myopathy 05/11/2024   Sensorineural hearing loss (SNHL) of right ear with restricted hearing of left ear 10/22/2023   History of Lyme disease 03/25/2019   Chronic migraine 03/10/2019   Lateral epicondylitis, right elbow 12/15/2018   Plantar fasciitis, left 03/05/2018   Right shoulder pain 07/02/2017   Meniere's disease 06/09/2016   Medial epicondylitis of right elbow 02/18/2016   Onychodystrophy 12/05/2014   Primary osteoarthritis of both elbows 12/05/2014   Elevated hemoglobin 08/01/2013   Male hypogonadism 06/01/2013   GERD (gastroesophageal reflux disease) 03/01/2013   Annual physical exam 06/07/2012   Celiac disease 06/30/2011   Hyperlipidemia 09/27/2010   Bilateral carpal tunnel syndrome 09/27/2010   Past Medical History:  Diagnosis Date   Anxiety    Arthritis  Celiac disease    Hand fracture    Headache    Hyperlipidemia    Lyme disease    Meniere disease    Migraines    Normal cardiac stress test 11/2008   at [facility] - negative.  terminated for fatigue   Thyroid  disease    Past Surgical History:  Procedure  Laterality Date   COLONOSCOPY  05/08/2023   Vito Cirigliano   COLONOSCOPY W/ POLYPECTOMY  2021   TA SS   surgery on right hand Bilateral    Social History[1]    ROS: as noted in HPI  Objective:     BP 115/76   Pulse 83   Ht 5' 8 (1.727 m)   Wt 212 lb (96.2 kg)   SpO2 97%   BMI 32.23 kg/m  BP Readings from Last 3 Encounters:  09/05/24 115/76  08/11/24 115/78  07/01/24 126/80   Wt Readings from Last 3 Encounters:  09/05/24 212 lb (96.2 kg)  08/11/24 211 lb (95.7 kg)  07/01/24 209 lb (94.8 kg)      Physical Exam Vitals and nursing note reviewed. Exam conducted with a chaperone present.  Constitutional:      General: He is not in acute distress.    Appearance: Normal appearance. He is not ill-appearing, toxic-appearing or diaphoretic.  HENT:     Head: Normocephalic and atraumatic.     Right Ear: Tympanic membrane, ear canal and external ear normal. There is no impacted cerumen.     Left Ear: Tympanic membrane, ear canal and external ear normal. There is no impacted cerumen.     Nose: Nose normal.     Mouth/Throat:     Mouth: Mucous membranes are moist.     Pharynx: Oropharynx is clear. No oropharyngeal exudate or posterior oropharyngeal erythema.  Eyes:     General: No scleral icterus.    Pupils: Pupils are equal, round, and reactive to light.  Cardiovascular:     Rate and Rhythm: Normal rate and regular rhythm.  Pulmonary:     Effort: Pulmonary effort is normal. No respiratory distress.  Skin:    General: Skin is warm and dry.     Findings: No erythema or rash.  Neurological:     General: No focal deficit present.     Mental Status: He is alert and oriented to person, place, and time.      No results found for any visits on 09/05/24.  Last CBC Lab Results  Component Value Date   WBC 6.9 08/11/2024   HGB 18.6 (H) 08/11/2024   HCT 56.5 (H) 08/11/2024   MCV 88 08/11/2024   MCH 28.8 08/11/2024   RDW 12.4 08/11/2024   PLT 276 08/11/2024   Last  metabolic panel Lab Results  Component Value Date   GLUCOSE 79 08/11/2024   NA 138 08/11/2024   K 4.1 08/11/2024   CL 101 08/11/2024   CO2 20 08/11/2024   BUN 10 08/11/2024   CREATININE 0.81 08/11/2024   EGFR 108 08/11/2024   CALCIUM  9.4 08/11/2024   PROT 7.5 08/11/2024   ALBUMIN 4.5 08/11/2024   LABGLOB 3.0 08/11/2024   BILITOT 0.6 08/11/2024   ALKPHOS 65 08/11/2024   AST 24 08/11/2024   ALT 32 08/11/2024   Last lipids Lab Results  Component Value Date   CHOL 225 (H) 08/11/2024   HDL 41 08/11/2024   LDLCALC 140 (H) 08/11/2024   LDLDIRECT 113 06/09/2016   TRIG 242 (H) 08/11/2024   CHOLHDL 5.5 (H) 08/11/2024  Last hemoglobin A1c Lab Results  Component Value Date   HGBA1C 5.6 08/11/2024   Last thyroid  functions Lab Results  Component Value Date   TSH 4.610 (H) 08/11/2024   FREET4 1.24 08/11/2024   Last vitamin D  Lab Results  Component Value Date   VD25OH 64 05/16/2019   Last vitamin B12 and Folate Lab Results  Component Value Date   VITAMINB12 876 04/20/2023   FOLATE >20.0 04/20/2023      The 10-year ASCVD risk score (Arnett DK, et al., 2019) is: 3.8%  Assessment & Plan:  Elevated hemoglobin -     CBC with Differential/Platelet  BMI 32.0-32.9,adult  Other fatigue  Meniere's disease of right ear  Abnormal thyroid  function test  Assessment and Plan    Meniere's disease Chronic with recent prolonged bilateral attack. Symptoms include dizziness, hearing loss, tinnitus. Exacerbated by stress and anxiety. Managed with Ativan , hydroxyzine , lifestyle modifications. Surgical options not pursued. Hearing aid adjustment planned for left ear. - Continue Ativan  and hydroxyzine  as needed. - Maintain reduced caffeine and salt intake. - Audiologist appointment for hearing aid adjustment.  Elevated hemoglobin Post-blood donation with hydration issues. Monitoring required. - Ordered CBC to monitor hemoglobin levels.  Abnormal thyroid  function  tests Slightly elevated TSH, no treatment required. Steroid use may affect thyroid  function. - Monitor thyroid  function for now.  Chronic fatigue Persistent fatigue possibly related to Meniere's disease and medication. Potential adrenal involvement considered. - Follow-up in March for cortisol level assessment and further evaluation once prednisone  taper has been completed.         Return in about 10 weeks (around 11/14/2024).   Benton LITTIE Gave, PA    [1]  Social History Tobacco Use   Smoking status: Never   Smokeless tobacco: Former    Quit date: 06/02/2011   Tobacco comments:    started smokeless tobacco at age 65  Vaping Use   Vaping status: Some Days  Substance Use Topics   Alcohol use: No   Drug use: No   "

## 2024-09-06 ENCOUNTER — Encounter: Payer: Self-pay | Admitting: Urgent Care

## 2024-09-06 ENCOUNTER — Ambulatory Visit: Payer: Self-pay | Admitting: Urgent Care

## 2024-09-06 DIAGNOSIS — Z6832 Body mass index (BMI) 32.0-32.9, adult: Secondary | ICD-10-CM

## 2024-09-06 LAB — CBC WITH DIFFERENTIAL/PLATELET
Basophils Absolute: 0 x10E3/uL (ref 0.0–0.2)
Basos: 1 %
EOS (ABSOLUTE): 0.4 x10E3/uL (ref 0.0–0.4)
Eos: 6 %
Hematocrit: 52.4 % — ABNORMAL HIGH (ref 37.5–51.0)
Hemoglobin: 17.3 g/dL (ref 13.0–17.7)
Immature Grans (Abs): 0 x10E3/uL (ref 0.0–0.1)
Immature Granulocytes: 0 %
Lymphocytes Absolute: 1.6 x10E3/uL (ref 0.7–3.1)
Lymphs: 28 %
MCH: 28.7 pg (ref 26.6–33.0)
MCHC: 33 g/dL (ref 31.5–35.7)
MCV: 87 fL (ref 79–97)
Monocytes Absolute: 0.5 x10E3/uL (ref 0.1–0.9)
Monocytes: 9 %
Neutrophils Absolute: 3.2 x10E3/uL (ref 1.4–7.0)
Neutrophils: 56 %
Platelets: 310 x10E3/uL (ref 150–450)
RBC: 6.03 x10E6/uL — ABNORMAL HIGH (ref 4.14–5.80)
RDW: 12.6 % (ref 11.6–15.4)
WBC: 5.7 x10E3/uL (ref 3.4–10.8)

## 2024-09-06 LAB — SPECIMEN STATUS REPORT

## 2024-09-06 NOTE — Patient Instructions (Signed)
 Continue prednisone  taper. Will do chronic fatigue/ adrenal workup once medication has been completed.  Will recheck CBC today to assess response to phlebotomy.  Follow up in March, sooner if needed.

## 2024-09-09 MED ORDER — TIRZEPATIDE 10 MG/0.5ML ~~LOC~~ SOAJ: SUBCUTANEOUS | 11 refills | Status: AC

## 2024-09-29 ENCOUNTER — Other Ambulatory Visit: Payer: Self-pay | Admitting: Gastroenterology

## 2024-09-29 DIAGNOSIS — R109 Unspecified abdominal pain: Secondary | ICD-10-CM

## 2024-10-05 ENCOUNTER — Encounter: Payer: Self-pay | Admitting: Urgent Care

## 2024-10-20 ENCOUNTER — Ambulatory Visit: Admitting: Urgent Care

## 2024-11-14 ENCOUNTER — Ambulatory Visit: Admitting: Urgent Care

## 2025-02-09 ENCOUNTER — Ambulatory Visit: Admitting: Urgent Care
